# Patient Record
Sex: Male | Born: 1965 | Race: White | Hispanic: No | Marital: Married | Smoking: Current every day smoker
Health system: Southern US, Community
[De-identification: ages and names within clinical notes are randomized; demographics above are authoritative.]

## PROBLEM LIST (undated history)

## (undated) DIAGNOSIS — M1990 Unspecified osteoarthritis, unspecified site: Secondary | ICD-10-CM

## (undated) DIAGNOSIS — I10 Essential (primary) hypertension: Secondary | ICD-10-CM

## (undated) DIAGNOSIS — E785 Hyperlipidemia, unspecified: Secondary | ICD-10-CM

## (undated) DIAGNOSIS — J189 Pneumonia, unspecified organism: Secondary | ICD-10-CM

## (undated) DIAGNOSIS — G4730 Sleep apnea, unspecified: Secondary | ICD-10-CM

## (undated) DIAGNOSIS — R569 Unspecified convulsions: Secondary | ICD-10-CM

## (undated) DIAGNOSIS — F419 Anxiety disorder, unspecified: Secondary | ICD-10-CM

## (undated) DIAGNOSIS — R079 Chest pain, unspecified: Secondary | ICD-10-CM

## (undated) DIAGNOSIS — IMO0001 Reserved for inherently not codable concepts without codable children: Secondary | ICD-10-CM

## (undated) DIAGNOSIS — F319 Bipolar disorder, unspecified: Secondary | ICD-10-CM

## (undated) DIAGNOSIS — Z9289 Personal history of other medical treatment: Secondary | ICD-10-CM

## (undated) DIAGNOSIS — K219 Gastro-esophageal reflux disease without esophagitis: Secondary | ICD-10-CM

## (undated) HISTORY — DX: Chest pain, unspecified: R07.9

## (undated) HISTORY — DX: Unspecified convulsions: R56.9

## (undated) HISTORY — DX: Hyperlipidemia, unspecified: E78.5

## (undated) HISTORY — DX: Gastro-esophageal reflux disease without esophagitis: K21.9

## (undated) HISTORY — PX: BRONCHOSCOPY: SUR163

## (undated) HISTORY — DX: Bipolar disorder, unspecified: F31.9

## (undated) HISTORY — DX: Essential (primary) hypertension: I10

---

## 1988-09-24 HISTORY — PX: SEPTOPLASTY: SUR1290

## 2000-09-24 DIAGNOSIS — J189 Pneumonia, unspecified organism: Secondary | ICD-10-CM

## 2000-09-24 HISTORY — DX: Pneumonia, unspecified organism: J18.9

## 2002-08-28 ENCOUNTER — Emergency Department (HOSPITAL_COMMUNITY): Admission: EM | Admit: 2002-08-28 | Discharge: 2002-08-28 | Payer: Self-pay | Admitting: Emergency Medicine

## 2003-04-22 ENCOUNTER — Encounter: Payer: Self-pay | Admitting: Emergency Medicine

## 2003-04-22 ENCOUNTER — Emergency Department (HOSPITAL_COMMUNITY): Admission: EM | Admit: 2003-04-22 | Discharge: 2003-04-22 | Payer: Self-pay | Admitting: *Deleted

## 2003-06-06 ENCOUNTER — Encounter: Payer: Self-pay | Admitting: Emergency Medicine

## 2003-06-06 ENCOUNTER — Inpatient Hospital Stay (HOSPITAL_COMMUNITY): Admission: EM | Admit: 2003-06-06 | Discharge: 2003-06-21 | Payer: Self-pay | Admitting: Emergency Medicine

## 2003-06-06 ENCOUNTER — Emergency Department (HOSPITAL_COMMUNITY): Admission: EM | Admit: 2003-06-06 | Discharge: 2003-06-06 | Payer: Self-pay | Admitting: Emergency Medicine

## 2003-06-07 ENCOUNTER — Encounter (INDEPENDENT_AMBULATORY_CARE_PROVIDER_SITE_OTHER): Payer: Self-pay | Admitting: Cardiology

## 2003-06-08 ENCOUNTER — Encounter: Payer: Self-pay | Admitting: Infectious Diseases

## 2003-06-11 ENCOUNTER — Encounter: Payer: Self-pay | Admitting: Internal Medicine

## 2003-06-14 ENCOUNTER — Encounter: Payer: Self-pay | Admitting: Infectious Diseases

## 2003-06-16 ENCOUNTER — Encounter: Payer: Self-pay | Admitting: Thoracic Surgery

## 2003-06-17 ENCOUNTER — Encounter: Payer: Self-pay | Admitting: Internal Medicine

## 2003-06-21 ENCOUNTER — Encounter: Payer: Self-pay | Admitting: Internal Medicine

## 2003-07-19 ENCOUNTER — Ambulatory Visit (HOSPITAL_COMMUNITY): Admission: RE | Admit: 2003-07-19 | Discharge: 2003-07-19 | Payer: Self-pay | Admitting: Infectious Diseases

## 2003-07-19 ENCOUNTER — Encounter: Payer: Self-pay | Admitting: Infectious Diseases

## 2003-07-19 ENCOUNTER — Encounter: Admission: RE | Admit: 2003-07-19 | Discharge: 2003-07-19 | Payer: Self-pay | Admitting: Infectious Diseases

## 2003-11-19 ENCOUNTER — Inpatient Hospital Stay (HOSPITAL_COMMUNITY): Admission: EM | Admit: 2003-11-19 | Discharge: 2003-11-22 | Payer: Self-pay | Admitting: Emergency Medicine

## 2006-05-16 ENCOUNTER — Ambulatory Visit: Payer: Self-pay | Admitting: Family Medicine

## 2006-05-17 ENCOUNTER — Ambulatory Visit: Payer: Self-pay | Admitting: *Deleted

## 2006-06-27 ENCOUNTER — Ambulatory Visit: Payer: Self-pay | Admitting: Family Medicine

## 2006-08-29 ENCOUNTER — Ambulatory Visit: Payer: Self-pay | Admitting: Family Medicine

## 2007-02-10 ENCOUNTER — Ambulatory Visit: Payer: Self-pay | Admitting: Family Medicine

## 2007-06-11 ENCOUNTER — Encounter (INDEPENDENT_AMBULATORY_CARE_PROVIDER_SITE_OTHER): Payer: Self-pay | Admitting: *Deleted

## 2007-09-26 ENCOUNTER — Ambulatory Visit: Payer: Self-pay | Admitting: Family Medicine

## 2007-11-11 ENCOUNTER — Ambulatory Visit: Payer: Self-pay | Admitting: Internal Medicine

## 2008-01-05 ENCOUNTER — Ambulatory Visit: Payer: Self-pay | Admitting: Family Medicine

## 2008-01-19 ENCOUNTER — Ambulatory Visit: Payer: Self-pay | Admitting: Internal Medicine

## 2008-02-05 ENCOUNTER — Ambulatory Visit: Payer: Self-pay | Admitting: Internal Medicine

## 2008-02-05 ENCOUNTER — Encounter (INDEPENDENT_AMBULATORY_CARE_PROVIDER_SITE_OTHER): Payer: Self-pay | Admitting: Family Medicine

## 2008-02-05 LAB — CONVERTED CEMR LAB
ALT: 33 units/L (ref 0–53)
AST: 23 units/L (ref 0–37)
Albumin: 5.1 g/dL (ref 3.5–5.2)
BUN: 17 mg/dL (ref 6–23)
Basophils Relative: 0 % (ref 0–1)
CO2: 23 meq/L (ref 19–32)
Chloride: 101 meq/L (ref 96–112)
Cholesterol: 244 mg/dL — ABNORMAL HIGH (ref 0–200)
Creatinine, Ser: 1.12 mg/dL (ref 0.40–1.50)
Eosinophils Absolute: 0.1 10*3/uL (ref 0.0–0.7)
Glucose, Bld: 87 mg/dL (ref 70–99)
HCT: 46.5 % (ref 39.0–52.0)
Hemoglobin: 15.6 g/dL (ref 13.0–17.0)
Lymphocytes Relative: 32 % (ref 12–46)
Monocytes Relative: 6 % (ref 3–12)
Neutro Abs: 3.7 10*3/uL (ref 1.7–7.7)
Potassium: 4.8 meq/L (ref 3.5–5.3)
RBC: 5.21 M/uL (ref 4.22–5.81)
Sodium: 136 meq/L (ref 135–145)
Total Bilirubin: 0.5 mg/dL (ref 0.3–1.2)
Triglycerides: 89 mg/dL (ref <150)
WBC: 6.3 10*3/uL (ref 4.0–10.5)

## 2008-03-08 ENCOUNTER — Ambulatory Visit: Payer: Self-pay | Admitting: Internal Medicine

## 2008-04-05 ENCOUNTER — Ambulatory Visit: Payer: Self-pay | Admitting: Internal Medicine

## 2008-04-20 ENCOUNTER — Encounter (INDEPENDENT_AMBULATORY_CARE_PROVIDER_SITE_OTHER): Payer: Self-pay | Admitting: Family Medicine

## 2008-04-20 ENCOUNTER — Ambulatory Visit: Payer: Self-pay | Admitting: Internal Medicine

## 2008-04-20 LAB — CONVERTED CEMR LAB
ALT: 41 units/L (ref 0–53)
Cholesterol: 199 mg/dL (ref 0–200)
Total CHOL/HDL Ratio: 3.4
Triglycerides: 73 mg/dL (ref <150)
VLDL: 15 mg/dL (ref 0–40)

## 2008-12-14 ENCOUNTER — Ambulatory Visit: Payer: Self-pay | Admitting: Family Medicine

## 2008-12-14 LAB — CONVERTED CEMR LAB
CO2: 24 meq/L (ref 19–32)
Chloride: 100 meq/L (ref 96–112)
Creatinine, Ser: 1.04 mg/dL (ref 0.40–1.50)
PSA: 0.9 ng/mL (ref 0.10–4.00)
Testosterone: 196.29 ng/dL — ABNORMAL LOW (ref 350–890)

## 2009-01-20 ENCOUNTER — Ambulatory Visit: Payer: Self-pay | Admitting: Internal Medicine

## 2009-02-16 ENCOUNTER — Ambulatory Visit: Payer: Self-pay | Admitting: Internal Medicine

## 2009-03-23 ENCOUNTER — Ambulatory Visit: Payer: Self-pay | Admitting: Internal Medicine

## 2009-04-22 ENCOUNTER — Ambulatory Visit: Payer: Self-pay | Admitting: Internal Medicine

## 2009-05-20 ENCOUNTER — Ambulatory Visit: Payer: Self-pay | Admitting: Internal Medicine

## 2009-06-20 ENCOUNTER — Ambulatory Visit: Payer: Self-pay | Admitting: Internal Medicine

## 2009-07-20 ENCOUNTER — Ambulatory Visit: Payer: Self-pay | Admitting: Internal Medicine

## 2009-08-23 ENCOUNTER — Ambulatory Visit: Payer: Self-pay | Admitting: Internal Medicine

## 2009-10-12 ENCOUNTER — Ambulatory Visit: Payer: Self-pay | Admitting: Internal Medicine

## 2009-11-10 ENCOUNTER — Ambulatory Visit: Payer: Self-pay | Admitting: Internal Medicine

## 2009-12-08 ENCOUNTER — Ambulatory Visit: Payer: Self-pay | Admitting: Internal Medicine

## 2010-01-20 ENCOUNTER — Ambulatory Visit: Payer: Self-pay | Admitting: Family Medicine

## 2010-10-25 ENCOUNTER — Other Ambulatory Visit: Payer: Self-pay | Admitting: Family Medicine

## 2010-10-25 ENCOUNTER — Ambulatory Visit (INDEPENDENT_AMBULATORY_CARE_PROVIDER_SITE_OTHER): Payer: BC Managed Care – PPO | Admitting: Family Medicine

## 2010-10-25 ENCOUNTER — Ambulatory Visit: Admit: 2010-10-25 | Payer: Self-pay | Admitting: Family Medicine

## 2010-10-25 ENCOUNTER — Encounter: Payer: Self-pay | Admitting: Family Medicine

## 2010-10-25 DIAGNOSIS — E785 Hyperlipidemia, unspecified: Secondary | ICD-10-CM

## 2010-10-25 DIAGNOSIS — F17200 Nicotine dependence, unspecified, uncomplicated: Secondary | ICD-10-CM | POA: Insufficient documentation

## 2010-10-25 DIAGNOSIS — F319 Bipolar disorder, unspecified: Secondary | ICD-10-CM

## 2010-10-25 DIAGNOSIS — K219 Gastro-esophageal reflux disease without esophagitis: Secondary | ICD-10-CM | POA: Insufficient documentation

## 2010-10-25 DIAGNOSIS — I10 Essential (primary) hypertension: Secondary | ICD-10-CM

## 2010-10-25 LAB — BASIC METABOLIC PANEL
BUN: 17 mg/dL (ref 6–23)
CO2: 27 mEq/L (ref 19–32)
Calcium: 10 mg/dL (ref 8.4–10.5)
Chloride: 102 mEq/L (ref 96–112)
GFR: 90.97 mL/min (ref >60.00)
Glucose, Bld: 79 mg/dL (ref 70–99)
Sodium: 136 mEq/L (ref 135–145)

## 2010-10-25 LAB — LIPID PANEL: VLDL: 18.6 mg/dL (ref 0.0–40.0)

## 2010-11-01 NOTE — Assessment & Plan Note (Signed)
Summary: 45 min appt new pt to est/cle   Vital Signs:  Patient profile:   45 year old male Height:      70.5 inches Weight:      213.50 pounds BMI:     30.31 Temp:     98.3 degrees F oral Pulse rate:   68 / minute Pulse rhythm:   regular BP sitting:   130 / 90  (left arm) Cuff size:   regular  Vitals Entered By: Linde Gillis CMA Duncan Dull) (October 25, 2010 10:50 AM) CC: new patient, establish care  Does patient need assistance? Functional Status Self care Ambulation Normal   History of Present Illness: 45 yo here to establish care.  HTN- has been on Lisinopril HCTZ 10-12.5 mg daily.  No CP, blurred vision, LE edema or SOB.  Has had increased urination since he started it and would like to try something else.  No difficulty starting stream or weak stream.  No dysuria or back pain.  HLD- on Crestor 5 mg daily.  Has not had labs checked in over a year.  Bipolar disorder- diagnosed a  few years ago, has never felt better now that he is being treated for it at the [facility] center, follows up twice a year.  On Seroquel 600 mg daily and Zoloft 100 mg daily.  Tobacco abuse- ready to quit smoking.  Has smoked 1/2 ppd since he was 45 years old and has never quit in past.  He and his wife want to start a family so he is ready to quit.  Has a nicotine patch on right now.  Preventive Screening-Counseling & Management  Alcohol-Tobacco     Smoking Status: current     Smoking Cessation Counseling: yes     Smoke Cessation Stage: ready     Packs/Day: 0.5     Tobacco Counseling: to quit use of tobacco products  Current Medications (verified): 1)  Viagra 100 Mg Tabs (Sildenafil Citrate) .... Take 1/2 To 1 Tablet One Hour Prior To Sexual Activity 2)  Crestor 5 Mg  Tabs (Rosuvastatin Calcium) .... Take 1 Tab By Mouth Daily 3)  Zolpidem Tartrate 10 Mg Tabs (Zolpidem Tartrate) .... Take One Tablet By Mouth At Bedtime 4)  Omeprazole 40 Mg Cpdr (Omeprazole) .Marland Kitchen.. 1 Tab By Mouth Daily. 5)  Seroquel  300 Mg Tabs (Quetiapine Fumarate) .... Take Two Tablets By Mouth At Bedtime 6)  Lisinopril 20 Mg Tabs (Lisinopril) .Marland Kitchen.. 1 Tablet By Mouth Daily 7)  Zoloft 100 Mg Tabs (Sertraline Hcl) .... Take One Tablet By Mouth Every Morning 8)  Bupropion Hcl 150 Mg Xr12h-Tab (Bupropion Hcl) .... Initiate With 150 Mg Once Daily For 3 Days; Increase To 150 Mg Twice Daily; Treatment Should Continue For 7-12 Weeks  Allergies (verified): No Known Drug Allergies  Past History:  Family History: Last updated: 10/25/2010 Family History Depression Family History of Skin cancer- mom died at 20 of Melanoma  Social History: Last updated: 10/25/2010 Married Current Smoker  Risk Factors: Smoking Status: current (10/25/2010) Packs/Day: 0.5 (10/25/2010)  Past Medical History: Hyperlipidemia Hypertension biopolar disorder GERD  Past Surgical History: Denies surgical history  Family History: Family History Depression Family History of Skin cancer- mom died at 53 of Melanoma  Social History: Married Current Smoker Smoking Status:  current Packs/Day:  0.5  Review of Systems      See HPI General:  Denies malaise. Eyes:  Denies blurring. ENT:  Denies difficulty swallowing. CV:  Denies chest pain or discomfort. Resp:  Denies  shortness of breath. GI:  Denies abdominal pain and change in bowel habits. GU:  Complains of erectile dysfunction and urinary frequency; denies discharge, dysuria, incontinence, and urinary hesitancy. MS:  Denies joint pain, joint redness, and joint swelling. Derm:  Denies rash. Neuro:  Denies headaches. Psych:  Denies anxiety, depression, sense of great danger, suicidal thoughts/plans, thoughts of violence, unusual visions or sounds, and thoughts /plans of harming others. Endo:  Denies cold intolerance, heat intolerance, and polyuria. Heme:  Denies abnormal bruising and bleeding.  Physical Exam  General:  alert, well-developed, well-nourished, and well-hydrated.     Head:  normocephalic and atraumatic.   Eyes:  vision grossly intact, pupils equal, pupils round, and pupils reactive to light.   Ears:  R ear normal and L ear normal.   Nose:  no external deformity.   Mouth:  good dentition.   Neck:  No deformities, masses, or tenderness noted. Lungs:  Normal respiratory effort, chest expands symmetrically. Lungs are clear to auscultation, no crackles or wheezes. Heart:  Normal rate and regular rhythm. S1 and S2 normal without gallop, murmur, click, rub or other extra sounds. Abdomen:  Bowel sounds positive,abdomen soft and non-tender without masses, organomegaly or hernias noted. Msk:  No deformity or scoliosis noted of thoracic or lumbar spine.   Extremities:  no edema, small ganglion cyst on left wrist, non tender to palpation Neurologic:  alert & oriented X3 and gait normal.   Skin:  Intact without suspicious lesions or rashes Psych:  Cognition and judgment appear intact. Alert and cooperative with normal attention span and concentration. No apparent delusions, illusions, hallucinations   Impression & Recommendations:  Problem # 1:  HYPERTENSION (ICD-401.9) Assessment Unchanged Stable but he would like to wean off diurectic.  Will d/c HCTZ/Lisinopril combo pill and increase dose of Lisinopril to 20 mg daily. BMET today.  Follow up BP in one month. His updated medication list for this problem includes:    Lisinopril 20 Mg Tabs (Lisinopril) .Marland Kitchen... 1 tablet by mouth daily  Orders: TLB-BMP (Basic Metabolic Panel-BMET) (80048-METABOL)  Problem # 2:  BIPOLAR DISORDER UNSPECIFIED (ICD-296.80) Assessment: Unchanged Stable on current meds.  Problem # 3:  HYPERLIPIDEMIA (ICD-272.4) Assessment: Unchanged recheck FLP today. His updated medication list for this problem includes:    Crestor 5 Mg Tabs (Rosuvastatin calcium) .Marland Kitchen... Take 1 tab by mouth daily  Orders: Venipuncture (62952) TLB-Lipid Panel (80061-LIPID)  Problem # 4:  TOBACCO ABUSE  (ICD-305.1) Assessment: Unchanged Ready to quit! Given h/o bipolar D/o and depression, not appropriate candidate for chantix. discussed other options, will continue patches and add Zyban.  Problem # 5:  GERD (ICD-530.81) Assessment: Unchanged Stable on Protonix but too costly.  Will d/c protonix and try omeprazole. Advised that smoking worsens GERD so hopefully he will not need a PPI after he quits smoking. His updated medication list for this problem includes:    Omeprazole 40 Mg Cpdr (Omeprazole) .Marland Kitchen... 1 tab by mouth daily.  Complete Medication List: 1)  Viagra 100 Mg Tabs (Sildenafil citrate) .... Take 1/2 to 1 tablet one hour prior to sexual activity 2)  Crestor 5 Mg Tabs (Rosuvastatin calcium) .... Take 1 tab by mouth daily 3)  Zolpidem Tartrate 10 Mg Tabs (Zolpidem tartrate) .... Take one tablet by mouth at bedtime 4)  Omeprazole 40 Mg Cpdr (Omeprazole) .Marland Kitchen.. 1 tab by mouth daily. 5)  Seroquel 300 Mg Tabs (Quetiapine fumarate) .... Take two tablets by mouth at bedtime 6)  Lisinopril 20 Mg Tabs (Lisinopril) .Marland Kitchen.. 1 tablet  by mouth daily 7)  Zoloft 100 Mg Tabs (Sertraline hcl) .... Take one tablet by mouth every morning 8)  Bupropion Hcl 150 Mg Xr12h-tab (Bupropion hcl) .... Initiate with 150 mg once daily for 3 days; increase to 150 mg twice daily; treatment should continue for 7-12 weeks  Patient Instructions: 1)  Great to meet you. 2)  Please stop taking the Lisinopril-HCTZ and start taking Lisinopril 20 mg daily. Prescriptions: VIAGRA 100 MG TABS (SILDENAFIL CITRATE) take 1/2 to 1 tablet one hour prior to sexual activity  #10 x 3   Entered and Authorized by:   Ruthe Mannan MD   Signed by:   Ruthe Mannan MD on 10/25/2010   Method used:   Print then Give to Patient   RxID:   [id number] CRESTOR 5 MG  TABS (ROSUVASTATIN CALCIUM) Take 1 tab by mouth daily  #90 x 3   Entered and Authorized by:   Ruthe Mannan MD   Signed by:   Ruthe Mannan MD on 10/25/2010   Method used:   Print then  Give to Patient   RxID:   [id number] OMEPRAZOLE 40 MG CPDR (OMEPRAZOLE) 1 tab by mouth daily.  #90 x 3   Entered and Authorized by:   Ruthe Mannan MD   Signed by:   Ruthe Mannan MD on 10/25/2010   Method used:   Print then Give to Patient   RxID:   [id number] LISINOPRIL 20 MG TABS (LISINOPRIL) 1 tablet by mouth daily  #90 x 3   Entered and Authorized by:   Ruthe Mannan MD   Signed by:   Ruthe Mannan MD on 10/25/2010   Method used:   Print then Give to Patient   RxID:   [id number] BUPROPION HCL 150 MG XR12H-TAB (BUPROPION HCL) Initiate with 150 mg once daily for 3 days; increase to 150 mg twice daily; treatment should continue for 7-12 weeks  #90 x 3   Entered and Authorized by:   Ruthe Mannan MD   Signed by:   Ruthe Mannan MD on 10/25/2010   Method used:   Print then Give to Patient   RxID:   [id number]    Orders Added: 1)  Venipuncture [60109] 2)  TLB-Lipid Panel [80061-LIPID] 3)  TLB-BMP (Basic Metabolic Panel-BMET) [80048-METABOL] 4)  New Patient Level III [32355]    Current Allergies (reviewed today): No known allergies   TD Result Date:  11/11/2007 TD Result:  historical

## 2010-11-02 ENCOUNTER — Telehealth: Payer: Self-pay | Admitting: Family Medicine

## 2010-11-06 ENCOUNTER — Encounter (INDEPENDENT_AMBULATORY_CARE_PROVIDER_SITE_OTHER): Payer: Self-pay | Admitting: *Deleted

## 2010-11-15 NOTE — Miscellaneous (Signed)
Summary: med list update- increase in crestor dose  Medications Added CRESTOR 10 MG TABS (ROSUVASTATIN CALCIUM) take one by mouth daily       Clinical Lists Changes  Medications: Changed medication from CRESTOR 5 MG  TABS (ROSUVASTATIN CALCIUM) Take 1 tab by mouth daily to CRESTOR 10 MG TABS (ROSUVASTATIN CALCIUM) take one by mouth daily     Prior Medications: VIAGRA 100 MG TABS (SILDENAFIL CITRATE) take 1/2 to 1 tablet one hour prior to sexual activity CRESTOR 10 MG TABS (ROSUVASTATIN CALCIUM) take one by mouth daily ZOLPIDEM TARTRATE 10 MG TABS (ZOLPIDEM TARTRATE) take one tablet by mouth at bedtime OMEPRAZOLE 40 MG CPDR (OMEPRAZOLE) 1 tab by mouth daily. SEROQUEL 300 MG TABS (QUETIAPINE FUMARATE) take two tablets by mouth at bedtime LISINOPRIL 20 MG TABS (LISINOPRIL) 1 tablet by mouth daily ZOLOFT 100 MG TABS (SERTRALINE HCL) take one tablet by mouth every morning BUPROPION HCL 150 MG XR12H-TAB (BUPROPION HCL) Initiate with 150 mg once daily for 3 days; increase to 150 mg twice daily; treatment should continue for 7-12 weeks Current Allergies: No known allergies

## 2010-11-21 NOTE — Progress Notes (Signed)
Summary: prior Berkley Harvey is needed for crestor  Phone Note From Pharmacy   Caller: [employer] Summary of Call: Prior Berkley Harvey is needed for crestor, form is on your desk, and can wait until your return to the office. Initial call taken by: Lowella Petties CMA, AAMA,  November 02, 2010 10:10 AM  Follow-up for Phone Call        in my box. Ruthe Mannan MD  November 06, 2010 9:04 AM lMOM for pt to call.              Lowella Petties CMA, AAMA  November 06, 2010 9:21 AM Pt has not tried any other meds for cholesterol, but has been taking this for1-2 years, that dose was recently increased.  Form faxed. Follow-up by: Lowella Petties CMA, AAMA,  November 06, 2010 10:03 AM  Additional Follow-up for Phone Call Additional follow up Details #1::        Prior auth denied for crestor, he has to try generic first.  Denial letter is on your desk.           Lowella Petties CMA, AAMA  November 13, 2010 9:52 AM     Additional Follow-up for Phone Call Additional follow up Details #2::    ok, try simvastatin 40 mg daily please. Ruthe Mannan MD  November 13, 2010 10:32 AM Called in simvastain 40 mg's, one a day to [facility].  LMOM for pt to call.             Lowella Petties CMA, AAMA  November 13, 2010 4:41 PM  Spoke with spouse, Rx faxed to pharmacy.   Follow-up by: Linde Gillis CMA Duncan Dull),  November 13, 2010 4:48 PM  New/Updated Medications: SIMVASTATIN 40 MG TABS (SIMVASTATIN) Take one tablet at bedtime Prescriptions: SIMVASTATIN 40 MG TABS (SIMVASTATIN) Take one tablet at bedtime  #90 x 3   Entered by:   Linde Gillis CMA (AAMA)   Authorized by:   Ruthe Mannan MD   Signed by:   Linde Gillis CMA (AAMA) on 11/13/2010   Method used:   Faxed to ...       [facility] (retail)       [address]       [address]       [place], [place]  [address]  [place]       Ph: [phone]       Fax: [phone]   RxID:   [id number]

## 2011-02-09 NOTE — Discharge Summary (Signed)
[name]                          ACCOUNT NO.:  [id number]   MEDICAL RECORD NO.:  [id number]                   PATIENT TYPE:  INP   LOCATION:  0462                                 FACILITY:  [facility]   PHYSICIAN:  Sherin Quarry, MD                   DATE OF BIRTH:  [birth year]   DATE OF ADMISSION:  11/19/2003  DATE OF DISCHARGE:  11/22/2003                                 DISCHARGE SUMMARY   HISTORY OF PRESENT ILLNESS:  [name] is a 13 year old man who is a  chronic drug abuser.  Prior to this admission, the patient had been smoking  crack cocaine on a regular basis using injectable Dilaudid and OxyContin.  He presented to the [facility] Emergency Room on November 19, 2003, with a  history of fever of about seven days duration associated with chills,  sweats, nausea, and malaise.  He complained of chronic back pain.  He stated  that he had lost weight.  For the last three or four days he had noted  vomiting and diarrhea.  About one week prior to admission he developed a  boil on both his left and right forearm.  He is quite definite that he did  not inject himself in these areas.  Subsequently, he developed boils on his  scrotal area and in his nose.  He presented to the [facility] Emergency  Room with these complaints.  Of note, is that the patient had been  hospitalized in September 2004, with a Staphylococcal sepsis with septic  emboli identified in the chest.  This heightened the level of concern that  he might be experiencing a similar problem.   PHYSICAL EXAMINATION:  VITAL SIGNS:  Temperature was 97, blood pressure  129/89, pulse was 92, respirations 20.  HEENT:  Within normal limits.  CHEST:  Clear to auscultation and percussion.  CARDIOVASCULAR:  Normal S1 and S2.  There were no murmurs, rubs, or gallops.  ABDOMEN:  Benign, normal bowel sounds without masses, tenderness, or  organomegaly.  NEUROLOGIC:  Within normal limits.  EXTREMITIES:  Revealed a 3 cm  raised lesion consistent with a boil on the  forearm of both the right and left arms.  These show minimal residual  fluctuance.  There is also a boil on the scrotum.  The patient had  apparently incised these areas prior to presentation.   LABORATORY DATA:  Urine for an UA and C&S which was culture negative.  CBC  revealed a white count of 7800, there was a normal differential.  Sedimentation rate was 35.  CMET was remarkable for a normal liver profile.  Three sets of blood cultures were obtained.  These are negative at this  time, i.e., after 3-1/2 days of incubation.  A chest x-ray was obtained to  follow up the previous finding of septic emboli in lungs.  This showed  absolutely clear lungs with  no signs of focal consolidation.  The cardiac  silhouette was felt to be normal.   HOSPITAL COURSE:  On admission, the patient was placed empirically on  vancomycin 1 g IV q.12h. and Rocephin 2 g IV q.24h.  He tolerated these  medications well.  He remained afebrile.  The lesions on his forearms dried  up and looked good at this time.  By November 22, 2003, his blood cultures  were negative, and the patient appeared to be doing well.  It was felt  reasonable to discharge him.   DISCHARGE DIAGNOSES:  1. Multiple boils on skin with no evidence of Staphylococcal sepsis.  2. Multiple drug abuse.  3. History of Staphylococcal sepsis.   On discharge, the patient will be instructed to wash his skin on a daily  basis with Hibiclens to prevent Staphylococcal skin carriage.  He indicates  that it is his plan to follow up with the drug rehab treatment facility  through [facility], and that friends are planning to take him there after  he is discharged.                                               Sherin Quarry, MD    SY/MEDQ  D:  11/22/2003  T:  11/22/2003  Job:  [id number]

## 2011-02-09 NOTE — H&P (Signed)
[name], [name]                          ACCOUNT NO.:  [id number]   MEDICAL RECORD NO.:  [id number]                   PATIENT TYPE:  EMS   LOCATION:  MAJO                                 FACILITY:  [facility]   PHYSICIAN:  Jackie Plum, M.D.             DATE OF BIRTH:  1966-02-27   DATE OF ADMISSION:  06/06/2003  DATE OF DISCHARGE:  06/06/2003                                HISTORY & PHYSICAL   PROBLEM LIST:  1. 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     a. Admission CT scan of the chest notable for multiple cavitary nodules.        No significant adenopathy.  2. Mild hemoptysis with likely secondary problem #1.  3. Hyponatremia likely secondary to decreased p.o. intake, cannot rule out     syndrome of inappropriate antidiuretic hormone related to pulmonary     presentation.  4. Leukocytosis secondary to problem #1.  5. Diarrhea etiology unclear, rule out Clostridium difficile colitis in view     of a recent treatment with antibiotics.  6. History of depression.  7. History of IV drug use.  8. History of illicit drug use.  9. Normocytic anemia secondary to problem #1.   CHIEF COMPLAINT:  Hemoptysis.   HISTORY OF PRESENT ILLNESS:  The patient is a 45 year old Caucasian  gentleman with history of IV drug use who presents with a three week history  of fever and chills.  He had been discharged from the hospital in July after  treatment for pneumonia.  The patient had been doing fairly well until about  two to three weeks ago when he started experiencing intermittent fever and  chills.  Over the last one week his problems have progressively worsened,  cough was chunks of very black hemoptysis (volume of which is quantified  as about 20% of teaspoon).  He has been feeling nauseous with generalized  weakness.  He has also been having some vomiting with diarrhea which is not  bloody and not mucoid.  He  denies any abdominal pain.  He has also been  experiencing some sternal chest pain which seems to be pleuritic, worsened  by deep breaths and coughing.  He has been having some occasional headaches.  He denies any frequency of micturition or dysuria.  He has lost about 10  pounds of weight over the last two months or so.  His urine has noted to be  dark in coloration.   PAST MEDICAL HISTORY:  Unremarkable for any chronic illnesses.  As mentioned  above he was there for pneumonia in [facility] in July of 2004.   ALLERGIES:  No known drug allergies.   MEDICATIONS:  Takes Paxil and Ambien.   FAMILY HISTORY:  Negative for heart disease.   SOCIAL HISTORY:  The patient is unemployed.  Drinks alcohol occasionally.  Smokes cigarettes one  pack a day for about 20 years.  He uses crack cocaine.   REVIEW OF SYSTEMS:  Significant positive and negative as in HPI but  otherwise unremarkable.   PHYSICAL EXAMINATION:  VITAL SIGNS:  Temperature 98.2 degrees Farenheit.  BP  125/81, heart rate 60, respiratory rate 18.  O2 saturation 99% on 2 liters  of oxygen.  GENERAL:  He was not in acute cardiopulmonary distress.  He was in painful  distress.  He looked chronically ill.  He had mild bitemporal recession.  HEENT:  Normocephalic, atraumatic, mild conjunctival pallor without any  icterus.  Oropharynx was slightly dry.  He did not have any adenopathy.  Extraocular movements were intact.  Pupils were equal, round to light.  His  tympanic membranes were unremarkable.  Oropharynx exam was negative for any  acute exudation or erythema.  NECK:  Supple with no thyromegaly.  No lymphadenopathy was appreciated.  LUNGS:  Described breath sounds and no adventitious sounds were appreciated  in the negative pressure isolation room at the ED.  CARDIAC:  He had regular rate and rhythm.  I could not appreciate any murmur  in the negative pressure room at the ED.  SKIN:  Warm and dry.  No rash was seen.   EXTREMITIES:  No cyanosis.  No clubbing.  No Janeway lesions appreciated.  No tenderness of the tips of the fingers was also appreciated.  CENTRAL NERVOUS SYSTEM:  He is alert and oriented x3 with an acute  cardiopulmonary distress.   LABORATORY DATA:  CT scan of chest as noted above.  UA was notable for large  blood.  No significant WBCs or bacteria.  His WBC count was 20.7, hemoglobin  11.5, hematocrit 34, MCV 83.6, platelet count 448.  Sodium 127, potassium  4.2, chloride 94, CO2 23, glucose 90, BUN 15, creatinine 1.1, bilirubin 0.8,  alkaline phosphatase __________  .  __________  20, PT 13, protein 8.6,  albumin 3.4, calcium 8.9.  Drug screen positive for cocaine.   IMPRESSION:  My impression is that of endocarditis in a patient with  intravenous drug use complicated by septic emboli to the lungs.  I cannot  rule out atypical pneumonia.   DISPOSITION:  The patient is chronic and therefore I doubt that he has  Staphylococcal pneumonia.  The patient is ill looking.   PLAN OF CARE:  Admit the patient to a telemetry bed.  He will get one set of  cardiac enzymes.  We will also look out for the patient's CPK on account of  complaints of dark urine.  We will know whether he may have rhabdomyolysis  associated with his infectious illness.  He will be __________  .  He will  receive supportive care including antiemetics and IV fluids and oxygen.   The patient will need counseling for his illicit drug use.                                               Jackie Plum, M.D.    GO/MEDQ  D:  06/06/2003  T:  06/06/2003  Job:  [id number]

## 2011-02-09 NOTE — Op Note (Signed)
   [name], [name]                          ACCOUNT NO.:  [id number]   MEDICAL RECORD NO.:  [id number]                   PATIENT TYPE:  INP   LOCATION:  0340                                 FACILITY:  [facility]   PHYSICIAN:  Meade Maw, M.D.                 DATE OF BIRTH:  02/26/1966   DATE OF PROCEDURE:  DATE OF DISCHARGE:                                 OPERATIVE REPORT   PROCEDURE:  Transesophageal echocardiogram.   REFERRING PHYSICIAN:  Jackie Plum, M.D.   INDICATION FOR TRANSESOPHAGEAL ECHOCARDIOGRAM:  Evaluation for bacterial  endocarditis.   DESCRIPTION OF PROCEDURE:  After obtaining written informed consent, the  patient was brought to the endoscopy lab in a postabsorptive state.  Topical  anesthesia was achieved using Cetacaine spray and viscous lidocaine.  The  patient was then given a total of 12 mg of Versed and 200 mcg of IV  fentanyl.  We were still unable to obtain appropriate sedation.  Anesthesia  support was obtained.  He was given 50 mg of Pentothal.  We were then able  to introduce the OmniPlane probe.  Multiple views were obtained at the  midesophageal, basal, and deep gastric views.  Color-flow Doppler was  performed across the mitral, tricuspid, and aortic valve.  Bubble study was  performed.  The ascending and descending aorta was then visualized.   FINDINGS:  He had normal mitral valve morphology.  There was mild mitral  regurgitation noted.  He had normal tricuspid valve morphology.  There was  mild to moderate tricuspid regurgitation noted.  He had normal aortic valve  morphology.  There was no aortic insufficiency noted.  He had normal chamber  dimensions, normal systolic function, estimated ejection fraction of 60%.  The left atrial appendage was within normal limits.  There was a negative  bubble study.  The ascending and descending aorta was within normal limits.  The pulmonary artery was grossly normal.   IMPRESSION:  No evidence of  bacterial endocarditis.                                               Meade Maw, M.D.    HP/MEDQ  D:  06/09/2003  T:  06/10/2003  Job:  [id number]

## 2011-02-09 NOTE — Discharge Summary (Signed)
NAMEKAMERON, [name]                          ACCOUNT NO.:  [id number]   MEDICAL RECORD NO.:  [id number]                   PATIENT TYPE:  INP   LOCATION:  0340                                 FACILITY:  [facility]   PHYSICIAN:  Sherin Quarry, MD                   DATE OF BIRTH:  01-Aug-1966   DATE OF ADMISSION:  06/06/2003  DATE OF DISCHARGE:                                 DISCHARGE SUMMARY   HISTORY OF PRESENT ILLNESS:  [name] is a 45 year old man with a  long-standing history of intravenous drug use and crack cocaine usage who  presented to [facility] on September 12 with a three week history  of fever and chills.  The patient reported that in addition, he had been  experiencing increased cough which had been productive of bloody phlegm.  He  experienced associated nausea, vomiting, and about 10 pounds weight loss.  His past history was remarkable only for an episode of pneumonia in July of  this year.  He smokes about one pack of cigarettes per day.   PHYSICAL EXAMINATION:  (At the time of admission as described by Jackie Plum, M.D.)  VITAL SIGNS:  Temperature 98.2, blood pressure 125/81, heart rate 60,  respirations 18, O2 saturation 99% on 2 L of oxygen.  GENERAL:  The patient was not felt to be in acute distress, although he  looked chronically ill and somewhat emaciated.  HEENT:  Within normal limits.  NECK:  Supple.  CHEST:  Diminished breath sounds at the bases.  CARDIOVASCULAR:  Normal S1 and S2 without murmurs, rubs, or gallops.  ABDOMEN:  Benign.  Normal bowel sounds without masses, tenderness,  organomegaly.  NEUROLOGIC:  Within normal limits.  EXTREMITIES:  Felt to be within normal limits as well.   LABORATORIES:  Serial blood cultures:  These were positive for  Staphylococcus aureus in multiple bottles.  The organism was methicillin  sensitive.  Drug screen was positive for cocaine.  Serial cardiac enzymes  were negative.  HIV serology was  nonreactive.  Initially, the sodium was  127, potassium 4.2, chloride 94, creatinine 1.1.  White count 20,700,  hemoglobin 11.5, platelets 448,000.  Initial chest x-ray showed abnormal  findings in the mediastinum which were initially interpreted as likely  secondary to adenopathy.  A CT scan of the chest was done and this showed  multiple cavitary lesions within the upper and lower lobes bilaterally which  looked like a septic embolic process.  On admission the patient was  administered IV fluids in the form of normal saline.  He was placed on  vancomycin, the dose of which was adjusted per pharmaceutical protocol.  He  was also started on Rocephin 1 g IV daily.  Ultimately the vancomycin dosage  was 1.25 g IV q.12h.  Subsequently, the patient was seen in consultation by  Rockey Situ. Roxan Hockey, M.D.  of the infectious disease service.  He recommended  that a transesophageal echocardiogram be performed and suggested that two  weeks of therapy might be adequate.  The transthoracic echocardiogram showed  evidence of tricuspid regurgitation.  The transesophageal echocardiogram was  negative for signs of endocarditis.  Therefore, ultimately, Rockey Situ.  Roxan Hockey, M.D. recommended that the patient be treated with nafcillin 2 g IV  q.4h. and gentamicin the dosage of which was ultimately 60 mg IV q.12h.  Subsequently, serial chest x-rays suggested that the patient had developed a  pleural effusion.  In light of this finding and in light of the presence of  abscesses in the chest, consultation was obtained with Ines Bloomer,  M.D. who recommended that an ultrasound guided thoracentesis be performed.  This was done on September 22.  645 mL of fluid was removed.  Studies on  this fluid showed the pH to be 7.38, the glucose to be 55.  AFB and fungal  cultures were negative.  Routine cultures were negative.  His total protein  was 4.6.  The most recent CT scan obtained was on September 27.  This showed   the hilar lesion to be essentially unchanged.  It showed some regression in  the size of other lesions.  On September 27 as the patient has completed two  weeks of therapy, it was felt reasonable to discharge him on a regimen of  oral Keflex.   IMPRESSION AND PLAN:  As per Rockey Situ. Roxan Hockey, M.D. recommendation we will  send him home on 500 mg of Keflex p.o. t.i.d.  He will also continue Paxil  20 mg daily, Protonix 40 mg daily, Zyprexa 15 mg at bedtime daily.  He  indicates that he will plan to enroll in an outpatient drug treatment  program, specifically [facility] and he has actually been placed on the waiting  list for this program.  He plans to live with his father after discharge.  Rockey Situ. Roxan Hockey, M.D. has graciously agreed to see the patient in follow-  up in two weeks and this will be arranged.  Efforts will be made to see if  the hospital can provide the patient with his antibiotic medications.   CONDITION ON DISCHARGE:  Fair.   DISCHARGE DIAGNOSES:  1. Staphylococcal sepsis with TEE negative for endocarditis.  2. Multiple pulmonary abscesses secondary to #1.  3. History of depression.  4. History of intravenous drug use.                                               Sherin Quarry, MD    SY/MEDQ  D:  06/21/2003  T:  06/21/2003  Job:  [id number]   cc:   Rockey Situ. Flavia Shipper., M.D.  [address]  [place]  [place] [address]  Fax: [phone]   Ines Bloomer, M.D.  [address]  [place]  [place] [address]

## 2011-02-13 ENCOUNTER — Encounter: Payer: Self-pay | Admitting: Family Medicine

## 2011-02-14 ENCOUNTER — Encounter: Payer: Self-pay | Admitting: Family Medicine

## 2011-02-14 ENCOUNTER — Other Ambulatory Visit: Payer: Self-pay | Admitting: *Deleted

## 2011-02-14 ENCOUNTER — Ambulatory Visit (INDEPENDENT_AMBULATORY_CARE_PROVIDER_SITE_OTHER): Payer: BC Managed Care – PPO | Admitting: Family Medicine

## 2011-02-14 VITALS — BP 104/80 | HR 95 | Temp 98.6°F | Ht 70.0 in | Wt 211.0 lb

## 2011-02-14 DIAGNOSIS — F319 Bipolar disorder, unspecified: Secondary | ICD-10-CM

## 2011-02-14 DIAGNOSIS — R3911 Hesitancy of micturition: Secondary | ICD-10-CM

## 2011-02-14 DIAGNOSIS — I10 Essential (primary) hypertension: Secondary | ICD-10-CM

## 2011-02-14 DIAGNOSIS — E785 Hyperlipidemia, unspecified: Secondary | ICD-10-CM

## 2011-02-14 DIAGNOSIS — N419 Inflammatory disease of prostate, unspecified: Secondary | ICD-10-CM

## 2011-02-14 LAB — POCT URINALYSIS DIPSTICK
Bilirubin, UA: NEGATIVE
Blood, UA: NEGATIVE
Glucose, UA: NEGATIVE
Leukocytes, UA: NEGATIVE
Nitrite, UA: NEGATIVE
Urobilinogen, UA: NEGATIVE
pH, UA: 5

## 2011-02-14 MED ORDER — ALPRAZOLAM 0.25 MG PO TABS: 0.2500 mg | ORAL_TABLET | Freq: Three times a day (TID) | ORAL | Status: DC | PRN

## 2011-02-14 MED ORDER — SIMVASTATIN 40 MG PO TABS: 40.0000 mg | ORAL_TABLET | Freq: Every day | ORAL | Status: DC

## 2011-02-14 MED ORDER — OMEPRAZOLE 40 MG PO CPDR: 40.0000 mg | DELAYED_RELEASE_CAPSULE | Freq: Every day | ORAL | Status: DC

## 2011-02-14 MED ORDER — CIPROFLOXACIN HCL 500 MG PO TABS: 500.0000 mg | ORAL_TABLET | Freq: Two times a day (BID) | ORAL | Status: AC

## 2011-02-14 MED ORDER — ZOLPIDEM TARTRATE 10 MG PO TABS: 10.0000 mg | ORAL_TABLET | Freq: Every day | ORAL | Status: DC

## 2011-02-14 MED ORDER — LISINOPRIL 20 MG PO TABS: 20.0000 mg | ORAL_TABLET | Freq: Every day | ORAL | Status: DC

## 2011-02-14 NOTE — Patient Instructions (Signed)
Prostatitis Prostatitis is an inflammation (the body's way of reacting to injury and/or infection) of the prostate gland. The prostate gland is a male organ. The gland is about the size and shape of a walnut. The prostate is located just below the bladder. It produces semen, which is a fluid that helps nourish and transport sperm. Prostatitis is the most common urinary tract problem in men younger than age 43. There are 4 categories of prostatitis:  I - Acute bacterial prostatitis.   II - Chronic bacterial prostatitis.   III - Chronic prostatitis and chronic pelvic pain syndrome (CPPS).   Inflammatory.   Non inflammatory.   IV - Asymptomatic inflammatory prostatitis.  Acute and chronic bacterial prostatitis are problems with bacterial infections of the prostate. "Acute" infection is usually a one-time problem. "Chronic" bacterial prostatitis is a condition with recurrent infection. It is usually caused by the same germ (bacteria). CPPS has symptoms similar to prostate infection. However, no infection is actually found. This condition can cause problems of ongoing pain. Currently, it cannot be cured. Treatments are available and aimed at symptom control.  Asymptomatic inflammatory prostatitis has no symptoms. It is a condition where infection-fighting cells are found by chance in the urine. The diagnosis is made most often during an exam for other conditions. Other conditions could be infertility or a high level of PSA (prostate-specific antigen) in the blood. SYMPTOMS Symptoms can vary depending upon the type of prostatitis that exists. There can also be overlap in symptoms. This can make diagnosis difficult. Symptoms: For Acute bacterial prostatitis  Painful urination.  Fever and/or chills.   Muscle and/or joint pains.   Low back pain.   Low abdominal pain.   Inability to empty bladder completely.   Sudden urges to urinate.  Frequent urination during the day.   Difficulty  starting urine stream.   Need to urinate several times at night (nocturia).   Weak urine stream.   Urethral (tube that carries urine from the bladder out of the body) discharge and dribbling after urination.   For Chronic bacterial prostatitis  Rectal pain.   Pain in the testicles, penis, or tip of the penis.   Pain in the space between the anus and scrotum (perineum).   Low back pain.   Low abdominal pain.   Problems with sexual function.   Painful ejaculation.   Bloody semen.   Inability to empty bladder completely.   Painful urination.   Sudden urges to urinate.   Frequent urination during the day.   Difficulty starting urine stream.   Need to urinate several times at night (nocturia).   Weak urine stream.   Dribbling after urination.   Urethral discharge.   For Chronic prostatitis and chronic pelvic pain syndrome (CPPS) Symptoms are the same as those for chronic bacterial prostatitis. Problems with sexual function are often the reason for seeking care. This important problem should be discussed with your caregiver. For Asymptomatic inflammatory prostatitis As noted above, there are no symptoms with this condition. DIAGNOSIS  Your caregiver may perform a rectal exam. This exam is to determine if the prostate is swollen and tender.   Sometimes blood work is performed. This is done to see if your white blood cell count is elevated. The Prostate Specific Antigen (PSA) is also measured. PSA is a blood test that can help detect early prostate cancer.   A urinalysis is done to find out what type of infection is present if this is a suspected cause. An additional  urinalysis may be done after a digital rectal exam. This is to see if white blood cells are pushed out of the prostate and into the urine. A low-grade infection of the prostate may not be found on the first urinalysis.  In more difficult cases, your caregiver may advise other tests. Tests could  include:  Urodynamics -- Tests the function of the bladder and the organs involved in triggering and controlling normal urination.   Urine flow rate.   Cystoscopy -- In this procedure, a thin, telescope-like tube with a light and tiny camera attached (cystoscope) is inserted into the bladder through the urethra. This allows the caregiver to see the inside of the urethra and bladder.   Electromyography -- This procedure tests how the muscles and nerves of the bladder work. It is focused on the muscles that control the anus and pelvic floor. These are the muscles between the anus and scrotum.  In people who show no signs of infection, certain uncommon infections might be causing constant or recurrent symptoms. These uncommon infections are difficult to detect. More work in medicine may help find solutions to these problems. TREATMENT Antibiotics are used to treat infections caused by germs. If the infection is not treated and becomes long lasting (chronic), it may become a lower grade infection with minor, continual problems. Without treatment, the prostate may develop a boil or furuncle (abscess). This may require surgical treatment. For those with chronic prostatitis and CPPS, it is important to work closely with your primary caregiver and urologist. For some, the medicines that are used to treat a non-cancerous, enlarged prostate (benign prostatic hypertrophy) may be helpful. Referrals to specialists other than urologists may be necessary. In rare cases when all treatments have been inadequate for pain control, an operation to remove the prostate may be recommended. This is very rare and before this is considered thorough discussion with your urologist is highly recommended.  In cases of secondary to chronic non-bacterial prostatitis, a good relationship with your urologist or primary caregiver is essential because it is often a recurrent prolonged condition that requires a good understanding of the  causes and a commitment to therapy aimed at controlling your symptoms. HOME CARE INSTRUCTIONS  Hot sitz baths for 20 minutes, 4 times per day, may help relieve pain.   Non-prescription pain killers may be used as your caregiver recommends if you have no allergies to them. Some illnesses or conditions prevent use of non-prescription drugs. If unsure, check with your caregiver. Take all medications as directed. Take the antibiotics for the prescribed length of time, even if you are feeling better.  SEEK MEDICAL CARE IF:  You have any worsening of the symptoms that originally brought you to your caregiver.   You have an oral temperature above 100.4.   You experience any side effects from medications prescribed.  SEEK IMMEDIATE MEDICAL CARE IF:  You have an oral temperature above 100.4, not controlled by medicine.   You have pain not relieved with medications.   You develop nausea, vomiting, lightheadedness, or have a fainting episode.   You are unable to urinate.   You pass bloody urine or clots.  Document Released: 09/07/2000 Document Re-Released: 12/05/2009 [facility] Patient Information 2011 [place], [place].

## 2011-02-14 NOTE — Assessment & Plan Note (Signed)
Stable with worsening panic attacks. Rx given for as needed Xanax.

## 2011-02-14 NOTE — Assessment & Plan Note (Signed)
New. UA neg. Will placed on 4 week course of Cipro. PSA ordered as well.

## 2011-02-14 NOTE — Telephone Encounter (Signed)
Patient request a 90 day supply of his medications.  He is coming in today to see Dr. Dayton Martes and would like to get the Rx's then.

## 2011-02-14 NOTE — Telephone Encounter (Signed)
Will give patient Rx's when he comes in today.

## 2011-02-14 NOTE — Progress Notes (Signed)
45 yo here for:  1.  Urinary frequency/hesitation- ongoing issue for several months. Weak stream, difficulty emptying bladder. No rectal pain, dysuria, fevers or chills. No FH of prostate CA.     Bipolar disorder- diagnosed a  few years ago, has never felt better now that he is being treated for it at the [facility] center, follows up twice a year.  On Seroquel 600 mg daily and Zoloft 100 mg daily.  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 No SI or HI. Used to have rx for prn xanax.  The PMH, PSH, Social History, Family History, Medications, and allergies have been reviewed in [facility], and have been updated if relevant.   Review of Systems       See HPI General:  Denies malaise. Eyes:  Denies blurring. ENT:  Denies difficulty swallowing. CV:  Denies chest pain or discomfort. Resp:  Denies shortness of breath. GI:  Denies abdominal pain and change in bowel habits. GU:  Complains of erectile dysfunction and urinary frequency; denies discharge, dysuria, incontinence, and urinary hesitancy. MS:  Denies joint pain, joint redness, and joint swelling. Derm:  Denies rash. Neuro:  Denies headaches. Psych:  Denies anxiety, depression, sense of great danger, suicidal thoughts/plans, thoughts of violence, unusual visions or sounds, and thoughts /plans of harming others. Endo:  Denies cold intolerance, heat intolerance, and polyuria. Heme:  Denies abnormal bruising and bleeding.  Physical Exam BP 104/80  Pulse 95  Temp(Src) 98.6 F (37 C) (Oral)  Ht 5\' 10"  (1.778 m)  Wt 211 lb (95.709 kg)  BMI 30.28 kg/m2  General:  alert, well-developed, well-nourished, and well-hydrated.   Head:  normocephalic and atraumatic.   Eyes:  vision grossly intact, pupils equal, pupils round, and pupils reactive to light.   Ears:  R ear normal and L ear normal.   Nose:  no external deformity.   Mouth:  good dentition.   Neck:  No deformities, masses, or tenderness  noted. Lungs:  Normal respiratory effort, chest expands symmetrically. Lungs are clear to auscultation, no crackles or wheezes. Heart:  Normal rate and regular rhythm. S1 and S2 normal without gallop, murmur, click, rub or other extra sounds. Abdomen:  Bowel sounds positive,abdomen soft and non-tender without masses, organomegaly or hernias noted. Msk:  No deformity or scoliosis noted of thoracic or lumbar spine.   Extremities:  no edema, small ganglion cyst on left wrist, non tender to palpation Neurologic:  alert & oriented X3 and gait normal.   Skin:  Intact without suspicious lesions or rashes Prostate:  Mildly enlarged, tender to palp, no masses Psych:  Cognition and judgment appear intact. Alert and cooperative with normal attention span and concentration. No apparent delusions, illusions, hallucinations

## 2011-02-26 ENCOUNTER — Other Ambulatory Visit: Payer: Self-pay | Admitting: *Deleted

## 2011-02-26 MED ORDER — ZOLPIDEM TARTRATE 10 MG PO TABS: 10.0000 mg | ORAL_TABLET | Freq: Every day | ORAL | Status: DC

## 2011-02-26 MED ORDER — LISINOPRIL 20 MG PO TABS: 20.0000 mg | ORAL_TABLET | Freq: Every day | ORAL | Status: DC

## 2011-02-26 MED ORDER — SIMVASTATIN 40 MG PO TABS: 40.0000 mg | ORAL_TABLET | Freq: Every day | ORAL | Status: DC

## 2011-02-26 NOTE — Telephone Encounter (Signed)
Patients spouse requested 30 day Rx refill from local pharmacy until mail order Rx's comes in.

## 2011-02-28 ENCOUNTER — Telehealth: Payer: Self-pay | Admitting: *Deleted

## 2011-02-28 NOTE — Telephone Encounter (Signed)
Prior auth is needed for zolpidem, form is on your desk. 

## 2011-03-01 NOTE — Telephone Encounter (Signed)
On my desk

## 2011-03-01 NOTE — Telephone Encounter (Signed)
Form faxed to [employer].

## 2011-03-03 NOTE — Telephone Encounter (Signed)
Prior auth given for zolpidem, approval letter placed on doctor's desk for signature and scanning.

## 2011-03-14 ENCOUNTER — Other Ambulatory Visit: Payer: Self-pay | Admitting: *Deleted

## 2011-03-15 MED ORDER — ALPRAZOLAM 0.25 MG PO TABS: 0.2500 mg | ORAL_TABLET | Freq: Three times a day (TID) | ORAL | Status: DC | PRN

## 2011-03-16 NOTE — Telephone Encounter (Signed)
Rx called to pharmacy

## 2011-04-13 ENCOUNTER — Other Ambulatory Visit: Payer: Self-pay | Admitting: *Deleted

## 2011-04-13 MED ORDER — ALPRAZOLAM 0.25 MG PO TABS: 0.2500 mg | ORAL_TABLET | Freq: Three times a day (TID) | ORAL | Status: DC | PRN

## 2011-04-13 NOTE — Telephone Encounter (Signed)
Last filled 03/16/11. 

## 2011-04-16 NOTE — Telephone Encounter (Signed)
Medication called to pharmacy- [place].

## 2011-05-16 ENCOUNTER — Other Ambulatory Visit: Payer: Self-pay | Admitting: *Deleted

## 2011-05-17 MED ORDER — ALPRAZOLAM 0.25 MG PO TABS: 0.2500 mg | ORAL_TABLET | Freq: Three times a day (TID) | ORAL | Status: DC | PRN

## 2011-05-17 NOTE — Telephone Encounter (Signed)
Rx called to [facility]. 

## 2011-06-18 ENCOUNTER — Other Ambulatory Visit: Payer: Self-pay | Admitting: *Deleted

## 2011-06-18 MED ORDER — ALPRAZOLAM 0.25 MG PO TABS: 0.2500 mg | ORAL_TABLET | Freq: Three times a day (TID) | ORAL | Status: DC | PRN

## 2011-06-18 NOTE — Telephone Encounter (Signed)
Last refill 05/17/2011.

## 2011-06-19 NOTE — Telephone Encounter (Signed)
Rx called to [facility]. 

## 2011-07-17 ENCOUNTER — Other Ambulatory Visit: Payer: Self-pay | Admitting: *Deleted

## 2011-07-17 MED ORDER — ALPRAZOLAM 0.25 MG PO TABS: 0.2500 mg | ORAL_TABLET | Freq: Three times a day (TID) | ORAL | Status: DC | PRN

## 2011-07-17 NOTE — Telephone Encounter (Signed)
Rx called to [facility]. 

## 2011-07-17 NOTE — Telephone Encounter (Signed)
Last refill 06/19/2011.

## 2011-08-15 ENCOUNTER — Other Ambulatory Visit: Payer: Self-pay | Admitting: *Deleted

## 2011-08-15 MED ORDER — ALPRAZOLAM 0.25 MG PO TABS: 0.2500 mg | ORAL_TABLET | Freq: Three times a day (TID) | ORAL | Status: DC | PRN

## 2011-08-15 NOTE — Telephone Encounter (Signed)
Last refill 07/17/2011.

## 2011-08-15 NOTE — Telephone Encounter (Signed)
Rx called to [facility].

## 2011-08-22 ENCOUNTER — Other Ambulatory Visit: Payer: Self-pay | Admitting: *Deleted

## 2011-08-22 MED ORDER — ZOLPIDEM TARTRATE 10 MG PO TABS: 10.0000 mg | ORAL_TABLET | Freq: Every day | ORAL | Status: DC

## 2011-08-22 NOTE — Telephone Encounter (Signed)
Needs order for generic ambien 90 day rx

## 2011-08-23 NOTE — Telephone Encounter (Signed)
Rx called to [facility]. 

## 2011-08-24 ENCOUNTER — Telehealth: Payer: Self-pay | Admitting: *Deleted

## 2011-08-24 NOTE — Telephone Encounter (Signed)
Pharmacy called ([facility]) called have not gotten rx on pt yet.   I checked chard Rx was called in to the wrong pharmacy (called to his local pharmacy) I called local pharmacy and had them cancel rx. I will call it into correct pharmacy.

## 2011-09-14 ENCOUNTER — Other Ambulatory Visit: Payer: Self-pay | Admitting: *Deleted

## 2011-09-14 MED ORDER — ALPRAZOLAM 0.25 MG PO TABS: 0.2500 mg | ORAL_TABLET | Freq: Three times a day (TID) | ORAL | Status: DC | PRN

## 2011-09-14 NOTE — Telephone Encounter (Signed)
rx called to pharmacy 

## 2011-10-18 ENCOUNTER — Other Ambulatory Visit: Payer: Self-pay | Admitting: *Deleted

## 2011-10-18 MED ORDER — ALPRAZOLAM 0.25 MG PO TABS: 0.2500 mg | ORAL_TABLET | Freq: Three times a day (TID) | ORAL | Status: DC | PRN

## 2011-10-18 NOTE — Telephone Encounter (Signed)
Rx called to [facility]. 

## 2011-10-18 NOTE — Telephone Encounter (Signed)
OK to refill? Please send back to Nik[facility].  

## 2011-11-16 ENCOUNTER — Other Ambulatory Visit: Payer: Self-pay

## 2011-11-16 MED ORDER — ALPRAZOLAM 0.25 MG PO TABS: 0.2500 mg | ORAL_TABLET | Freq: Three times a day (TID) | ORAL | Status: DC | PRN

## 2011-11-16 NOTE — Telephone Encounter (Signed)
Received fax refill request from patients pharmacy.  Okay to refill? 

## 2011-11-19 ENCOUNTER — Telehealth: Payer: Self-pay | Admitting: *Deleted

## 2011-11-19 ENCOUNTER — Ambulatory Visit (INDEPENDENT_AMBULATORY_CARE_PROVIDER_SITE_OTHER): Payer: BC Managed Care – PPO | Admitting: Family Medicine

## 2011-11-19 ENCOUNTER — Encounter: Payer: Self-pay | Admitting: Family Medicine

## 2011-11-19 VITALS — BP 108/80 | Temp 98.0°F | Wt 218.0 lb

## 2011-11-19 DIAGNOSIS — R3911 Hesitancy of micturition: Secondary | ICD-10-CM | POA: Insufficient documentation

## 2011-11-19 DIAGNOSIS — E349 Endocrine disorder, unspecified: Secondary | ICD-10-CM | POA: Insufficient documentation

## 2011-11-19 DIAGNOSIS — E291 Testicular hypofunction: Secondary | ICD-10-CM

## 2011-11-19 LAB — CBC WITH DIFFERENTIAL/PLATELET
Basophils Absolute: 0 10*3/uL (ref 0.0–0.1)
Eosinophils Absolute: 0.1 10*3/uL (ref 0.0–0.7)
Eosinophils Relative: 1.5 % (ref 0.0–5.0)
MCHC: 33.7 g/dL (ref 30.0–36.0)
MCV: 90.3 fl (ref 78.0–100.0)
Monocytes Absolute: 0.4 10*3/uL (ref 0.1–1.0)
Neutrophils Relative %: 64.6 % (ref 43.0–77.0)
Platelets: 234 10*3/uL (ref 150.0–400.0)
RDW: 13.4 % (ref 11.5–14.6)
WBC: 7.2 10*3/uL (ref 4.5–10.5)

## 2011-11-19 MED ORDER — TESTOSTERONE 50 MG/5GM (1%) TD GEL: 10.0000 g | Freq: Every day | TRANSDERMAL | Status: DC

## 2011-11-19 NOTE — Telephone Encounter (Signed)
Please call in rx as entered below. Thank you.

## 2011-11-19 NOTE — Telephone Encounter (Signed)
Rx called in as directed.   

## 2011-11-19 NOTE — Telephone Encounter (Signed)
Advised pt.  He would like for you to prescribe the androgel because it's going to be awhile before he sees urologist.  Uses [facility].

## 2011-11-19 NOTE — Telephone Encounter (Signed)
Advised pt of lab results.  He is asking if he would be a candidate for testosterone injections.  Please advise.

## 2011-11-19 NOTE — Telephone Encounter (Signed)
Medicine called to [facility]. 

## 2011-11-19 NOTE — Telephone Encounter (Signed)
I do not prescribe testosterone injections because there are too many peaks and valleys- difficult to get a steady level of testosterone but urologist would probably give them if he was interested.

## 2011-11-19 NOTE — Progress Notes (Signed)
46 yo here for:  1.  Urinary frequency/hesitation- ongoing issue for several months. Had not improved with cipro for presumed prostatitis. Weak stream, difficulty emptying bladder. No rectal pain, dysuria, fevers or chills. No FH of prostate CA.   Lab Results  Component Value Date   PSA 0.87 02/14/2011   PSA 0.90 12/14/2008      2.  Low T- Had been on testosterone for years but stopped taking it when he was trying to conceive their 35 month old daughter. Has been fatigued, more depressed. Would like to restart supplementation.  Patient Active Problem List  Diagnoses  . HYPERLIPIDEMIA  . BIPOLAR DISORDER UNSPECIFIED  . TOBACCO ABUSE  . HYPERTENSION  . GERD  . Prostatitis  . Urinary hesitancy  . Low testosterone   Past Medical History  Diagnosis Date  . Hyperlipidemia   . Hypertension   . GERD (gastroesophageal reflux disease)   . Bipolar disorder    No past surgical history on file. History  Substance Use Topics  . Smoking status: Current Everyday Smoker  . Smokeless tobacco: Not on file  . Alcohol Use:    Family History  Problem Relation Age of Onset  . Cancer Mother     melanoma  . Depression Other   . Cancer Other     skin   No Known Allergies Current Outpatient Prescriptions on File Prior to Visit  Medication Sig Dispense Refill  . ALPRAZolam (XANAX) 0.25 MG tablet Take 1 tablet (0.25 mg total) by mouth 3 (three) times daily as needed for anxiety.  90 tablet  0  . buPROPion (WELLBUTRIN SR) 150 MG 12 hr tablet Initiate with 150mg  once daily for 3 days, increase to 150mg  twice daily; treatment should continue for 7-12 weeks       . lisinopril (PRINIVIL,ZESTRIL) 20 MG tablet Take 1 tablet (20 mg total) by mouth daily.  30 tablet  0  . omeprazole (PRILOSEC) 40 MG capsule Take 1 capsule (40 mg total) by mouth daily.  90 capsule  3  . QUEtiapine (SEROQUEL) 300 MG tablet Take two tablets by mouth at bedtime       . sertraline (ZOLOFT) 100 MG tablet Take 100 mg by  mouth daily.        . sildenafil (VIAGRA) 100 MG tablet Take 1/2 to 1 tablet one hour prior to sexual activity       . simvastatin (ZOCOR) 40 MG tablet Take 1 tablet (40 mg total) by mouth at bedtime.  30 tablet  0  . zolpidem (AMBIEN) 10 MG tablet Take 1 tablet (10 mg total) by mouth at bedtime.  90 tablet  0     The PMH, PSH, Social History, Family History, Medications, and allergies have been reviewed in [facility], and have been updated if relevant.   Review of Systems       See HPI General:  Denies malaise. Eyes:  Denies blurring. ENT:  Denies difficulty swallowing. CV:  Denies chest pain or discomfort. Resp:  Denies shortness of breath. GI:  Denies abdominal pain and change in bowel habits. GU:  Complains of erectile dysfunction and urinary frequency; denies discharge, dysuria, incontinence, and urinary hesitancy. MS:  Denies joint pain, joint redness, and joint swelling. Derm:  Denies rash. Neuro:  Denies headaches. Psych:  Denies anxiety, depression, sense of great danger, suicidal thoughts/plans, thoughts of violence, unusual visions or sounds, and thoughts /plans of harming others. Endo:  Denies cold intolerance, heat intolerance, and polyuria. Heme:  Denies abnormal bruising  and bleeding.  Physical Exam BP 108/80  Temp(Src) 98 F (36.7 C) (Oral)  Wt 218 lb (98.884 kg)  General:  alert, well-developed, well-nourished, and well-hydrated.   Head:  normocephalic and atraumatic.   Eyes:  vision grossly intact, pupils equal, pupils round, and pupils reactive to light.   Ears:  R ear normal and L ear normal.   Nose:  no external deformity.   Mouth:  good dentition.   Neck:  No deformities, masses, or tenderness noted. Lungs:  Normal respiratory effort, chest expands symmetrically. Lungs are clear to auscultation, no crackles or wheezes. Heart:  Normal rate and regular rhythm. S1 and S2 normal without gallop, murmur, click, rub or other extra sounds. Psych:  Cognition and  judgment appear intact. Alert and cooperative with normal attention span and concentration. No apparent delusions, illusions, hallucinations  Assessment and Plan:  1. Urinary hesitancy  ?chronic prostatitis, will refer to urology for further work up. Ambulatory referral to Urology  2. Low testosterone  Recheck Testosterone, CBC today.  Testosterone, CBC with Differential

## 2011-11-19 NOTE — Patient Instructions (Signed)
Good to see you. Please stop by to see Shirlee Limerick on your way out. We will call you with your labs results.

## 2011-11-21 ENCOUNTER — Telehealth: Payer: Self-pay | Admitting: *Deleted

## 2011-11-21 NOTE — Telephone Encounter (Signed)
That is ok to change

## 2011-11-21 NOTE — Telephone Encounter (Signed)
Received a fax from pharmacy stating that patient would like to change the Androgel Rx to 1.62% because the 1% has a $200 copay.   Please advise.

## 2011-11-21 NOTE — Telephone Encounter (Signed)
Per Dr. Dayton Martes, advised pharmacist to change script to 40.5 mgs, or 2 pumps daily.

## 2011-12-13 ENCOUNTER — Other Ambulatory Visit: Payer: Self-pay | Admitting: *Deleted

## 2011-12-13 MED ORDER — ALPRAZOLAM 0.25 MG PO TABS: 0.2500 mg | ORAL_TABLET | Freq: Three times a day (TID) | ORAL | Status: DC | PRN

## 2011-12-13 MED ORDER — ZOLPIDEM TARTRATE 10 MG PO TABS: 10.0000 mg | ORAL_TABLET | Freq: Every day | ORAL | Status: DC

## 2011-12-13 NOTE — Telephone Encounter (Signed)
Ok to refill? Please send back to Laurie. 

## 2011-12-13 NOTE — Telephone Encounter (Signed)
Medicine called to pharmacy. 

## 2011-12-13 NOTE — Telephone Encounter (Signed)
Faxed request from [facility].  Last filled 08/24/11.

## 2011-12-14 NOTE — Telephone Encounter (Signed)
Medicine called to [facility] per pt's request.

## 2011-12-17 ENCOUNTER — Telehealth: Payer: Self-pay | Admitting: *Deleted

## 2011-12-17 NOTE — Telephone Encounter (Signed)
Prior auth is needed for ambien, form is on your desk. 

## 2011-12-18 NOTE — Telephone Encounter (Signed)
Form faxed, pt has not tried any other sleep aids.

## 2011-12-18 NOTE — Telephone Encounter (Signed)
On my desk

## 2012-01-14 ENCOUNTER — Other Ambulatory Visit: Payer: Self-pay | Admitting: *Deleted

## 2012-01-14 MED ORDER — ALPRAZOLAM 0.25 MG PO TABS: 0.2500 mg | ORAL_TABLET | Freq: Three times a day (TID) | ORAL | Status: DC | PRN

## 2012-01-14 NOTE — Telephone Encounter (Signed)
Faxed refill request from [facility], last filled 12/13/11.

## 2012-01-14 NOTE — Telephone Encounter (Signed)
Medicine called to pharmacy. 

## 2012-01-28 NOTE — Telephone Encounter (Signed)
Per pt's wife, his prior Berkley Harvey was approved.

## 2012-02-29 ENCOUNTER — Other Ambulatory Visit: Payer: Self-pay | Admitting: *Deleted

## 2012-02-29 ENCOUNTER — Other Ambulatory Visit: Payer: Self-pay | Admitting: Family Medicine

## 2012-02-29 MED ORDER — ZOLPIDEM TARTRATE 10 MG PO TABS: 10.0000 mg | ORAL_TABLET | Freq: Every day | ORAL | Status: DC

## 2012-02-29 NOTE — Telephone Encounter (Signed)
Addended by: Eliezer Bottom on: 02/29/2012 04:49 PM   Modules accepted: Orders

## 2012-03-03 NOTE — Telephone Encounter (Signed)
Script faxed to pharmacy

## 2012-03-14 ENCOUNTER — Other Ambulatory Visit: Payer: Self-pay | Admitting: *Deleted

## 2012-03-14 MED ORDER — ALPRAZOLAM 0.25 MG PO TABS: 0.2500 mg | ORAL_TABLET | Freq: Three times a day (TID) | ORAL | Status: DC | PRN

## 2012-03-14 NOTE — Telephone Encounter (Signed)
Faxed refill request from [facility], last filled 02/12/12.

## 2012-03-14 NOTE — Telephone Encounter (Signed)
Medicine called to pharmacy. 

## 2012-04-11 ENCOUNTER — Other Ambulatory Visit: Payer: Self-pay | Admitting: *Deleted

## 2012-04-11 MED ORDER — ALPRAZOLAM 0.25 MG PO TABS: 0.2500 mg | ORAL_TABLET | Freq: Three times a day (TID) | ORAL | Status: DC | PRN

## 2012-04-11 NOTE — Telephone Encounter (Signed)
Faxed refill request from [facility], last filled 90 on 03/14/12.

## 2012-04-14 NOTE — Telephone Encounter (Signed)
Medicine called to pharmacy. 

## 2012-05-14 ENCOUNTER — Other Ambulatory Visit: Payer: Self-pay | Admitting: *Deleted

## 2012-05-14 NOTE — Telephone Encounter (Signed)
Faxed refill request from [facility], last filled 04/14/12.

## 2012-05-15 MED ORDER — ALPRAZOLAM 0.25 MG PO TABS: 0.2500 mg | ORAL_TABLET | Freq: Three times a day (TID) | ORAL | Status: DC | PRN

## 2012-05-15 NOTE — Telephone Encounter (Signed)
Medicine called to pharmacy. 

## 2012-06-02 ENCOUNTER — Other Ambulatory Visit: Payer: Self-pay

## 2012-06-02 MED ORDER — ZOLPIDEM TARTRATE 10 MG PO TABS: 10.0000 mg | ORAL_TABLET | Freq: Every day | ORAL | Status: DC

## 2012-06-02 MED ORDER — TESTOSTERONE 50 MG/5GM (1%) TD GEL: 10.0000 g | Freq: Every day | TRANSDERMAL | Status: DC

## 2012-06-02 NOTE — Telephone Encounter (Signed)
pts wife request refill Ambien and Androgel to [facility].Please advise.

## 2012-06-02 NOTE — Telephone Encounter (Signed)
meds called to [facility], androgel called in as 2 boxes with 3 refills.

## 2012-06-03 MED ORDER — TESTOSTERONE 20.25 MG/1.25GM (1.62%) TD GEL: 2.0000 "application " | Freq: Every day | TRANSDERMAL | Status: DC

## 2012-06-03 NOTE — Telephone Encounter (Signed)
Ok to refill as requested 

## 2012-06-03 NOTE — Addendum Note (Signed)
Addended by: Dianne Dun on: 06/03/2012 10:21 AM   Modules accepted: Orders

## 2012-06-03 NOTE — Telephone Encounter (Signed)
Note from pharmacy, pt wants androgel 1.62% gel pump.  Apply 2 pumps daily.

## 2012-06-03 NOTE — Telephone Encounter (Signed)
Medicine called to pharmacy, quantity changed to 1 box with 3 refills, as that is what was called in yesterday.

## 2012-06-03 NOTE — Telephone Encounter (Signed)
Noted.  Please call in as entered below. Thank you.

## 2012-06-03 NOTE — Telephone Encounter (Signed)
Dr. Dayton Martes, I'm not sure which one to call in.  There are different choices of strenghts on the med list.

## 2012-06-16 ENCOUNTER — Telehealth: Payer: Self-pay | Admitting: Family Medicine

## 2012-06-16 ENCOUNTER — Other Ambulatory Visit: Payer: Self-pay | Admitting: *Deleted

## 2012-06-16 MED ORDER — ALPRAZOLAM 0.25 MG PO TABS: 0.2500 mg | ORAL_TABLET | Freq: Three times a day (TID) | ORAL | Status: DC | PRN

## 2012-06-16 NOTE — Telephone Encounter (Signed)
[facility] Triage Call Report Triage Record Num: [id number] Operator: Albertine Grates Patient Name: [name] Call Date & Time: 06/13/2012 6:05:14PM Patient Phone: [phone] PCP: Ruthe Mannan Patient Gender: Male PCP Fax : [phone] Patient DOB: 12-07-1965 [facility] Name[facility] Reason for Call: Caller: [place]/Patient; PCP: Ruthe Mannan (Family Practice); CB#: [phone]; Returned call to patient and wife states did not call office but pharmacy may have called as contacted them concerning refill for Xanax. Advised call office 9-23. Protocol(s) Used: Office Note Recommended Outcome per Protocol: Information Noted and Sent to Office Reason for Outcome: Caller information to office Care Advice: ~ 09/

## 2012-06-16 NOTE — Telephone Encounter (Signed)
Rx called in as directed.   

## 2012-06-16 NOTE — Telephone Encounter (Signed)
Dr Aron is out of office today 

## 2012-06-16 NOTE — Telephone Encounter (Signed)
plz phone in. 

## 2012-06-18 ENCOUNTER — Telehealth: Payer: Self-pay

## 2012-06-18 NOTE — Telephone Encounter (Signed)
pts wife returning call; pts wife said she was on phone in v/m and someone called but did not get name of caller/ Dr Elmer Sow CMA has not tried to call pt. Cannot locate caller. Pts wife said they got pts refill and does not need anything else.

## 2012-07-15 ENCOUNTER — Other Ambulatory Visit: Payer: Self-pay | Admitting: *Deleted

## 2012-07-15 MED ORDER — ALPRAZOLAM 0.25 MG PO TABS: 0.2500 mg | ORAL_TABLET | Freq: Three times a day (TID) | ORAL | Status: DC | PRN

## 2012-07-15 NOTE — Telephone Encounter (Signed)
Medicine called to [facility]. 

## 2012-08-12 ENCOUNTER — Other Ambulatory Visit: Payer: Self-pay | Admitting: *Deleted

## 2012-08-12 MED ORDER — ALPRAZOLAM 0.25 MG PO TABS: 0.2500 mg | ORAL_TABLET | Freq: Three times a day (TID) | ORAL | Status: DC | PRN

## 2012-08-12 NOTE — Telephone Encounter (Signed)
Faxed refill request from [facility], last filled 07/15/12.

## 2012-08-12 NOTE — Telephone Encounter (Signed)
Refill called to [facility].

## 2012-08-25 ENCOUNTER — Telehealth: Payer: Self-pay | Admitting: *Deleted

## 2012-08-25 ENCOUNTER — Other Ambulatory Visit: Payer: Self-pay | Admitting: *Deleted

## 2012-08-25 MED ORDER — ZOLPIDEM TARTRATE 10 MG PO TABS: 10.0000 mg | ORAL_TABLET | Freq: Every day | ORAL | Status: DC

## 2012-08-25 NOTE — Telephone Encounter (Signed)
Medicine called to [facility]. 

## 2012-08-25 NOTE — Telephone Encounter (Signed)
Phone call from pt's wife, she has faxed a form that she needs completed so that pt can apply for patient assistance for androgel.  Form completed, signed by Dr. Dayton Martes.  Copy of form made, original mailed back to patient.

## 2012-08-25 NOTE — Telephone Encounter (Signed)
Faxed refill request from [facility], last filled 90 on 06/02/12.

## 2012-09-08 ENCOUNTER — Telehealth: Payer: Self-pay

## 2012-09-08 NOTE — Telephone Encounter (Signed)
Ok to change as requested

## 2012-09-08 NOTE — Telephone Encounter (Signed)
Lady left v/m requesting med changed from Prilosec to generic Protonix to [facility]. Prilosec upsets pt stomach. Pt has tried Protonix before and worked well for indigestion.Please advise.

## 2012-09-09 ENCOUNTER — Other Ambulatory Visit: Payer: Self-pay | Admitting: *Deleted

## 2012-09-09 MED ORDER — ALPRAZOLAM 0.25 MG PO TABS: 0.2500 mg | ORAL_TABLET | Freq: Three times a day (TID) | ORAL | Status: DC | PRN

## 2012-09-09 MED ORDER — PANTOPRAZOLE SODIUM 40 MG PO TBEC: 40.0000 mg | DELAYED_RELEASE_TABLET | Freq: Every day | ORAL | Status: DC

## 2012-09-09 NOTE — Telephone Encounter (Signed)
Patient notified that script has been sent to the pharmacy. ?

## 2012-09-09 NOTE — Telephone Encounter (Signed)
Please confirm dose and directions.

## 2012-09-09 NOTE — Telephone Encounter (Signed)
Rx sent 

## 2012-09-11 NOTE — Telephone Encounter (Signed)
rx called in

## 2012-10-09 ENCOUNTER — Other Ambulatory Visit: Payer: Self-pay | Admitting: *Deleted

## 2012-10-09 MED ORDER — ALPRAZOLAM 0.25 MG PO TABS: 0.2500 mg | ORAL_TABLET | Freq: Three times a day (TID) | ORAL | Status: DC | PRN

## 2012-10-09 NOTE — Telephone Encounter (Signed)
Medicine called to pharmacy. 

## 2012-10-31 ENCOUNTER — Telehealth: Payer: Self-pay | Admitting: *Deleted

## 2012-10-31 NOTE — Telephone Encounter (Signed)
Prior auth needed for androgel, form is on your desk.

## 2012-10-31 NOTE — Telephone Encounter (Signed)
Form faxed

## 2012-10-31 NOTE — Telephone Encounter (Signed)
Form signed and on my desk. 

## 2012-11-05 NOTE — Telephone Encounter (Signed)
Prior auth given for androgel, advised pharmacy.  Approval letter placed on doctor's desk for signature and scanning.

## 2012-11-07 ENCOUNTER — Telehealth: Payer: Self-pay | Admitting: Family Medicine

## 2012-11-07 MED ORDER — ALPRAZOLAM 0.25 MG PO TABS: 0.2500 mg | ORAL_TABLET | Freq: Three times a day (TID) | ORAL | Status: DC | PRN

## 2012-11-07 NOTE — Telephone Encounter (Signed)
I called in 30 pills to give pt time to make f/u appointment with PCP.  I will defer further arrangements to PCP.

## 2012-11-07 NOTE — Telephone Encounter (Signed)
Caller advises that he has been trying to contact office since 11/04/2012 for refill on Alprazolam 0.25mg  TID but unable to do so d/t inclement weather and office being closed; emergent sxs r/o per Medication Questions - Adult; called on called provider and Dr. Para March asked for information to be sent to him via EPIC for verification.  Alprazolam 0.25mg  TID [facility] [address] [place], [place] [address] [phone] Fax [phone]  Donna Bernard, RN Prepared by Alden Server RN Burnett Sheng has no training in Epic documentation.)

## 2012-11-10 ENCOUNTER — Telehealth: Payer: Self-pay | Admitting: Family Medicine

## 2012-11-10 NOTE — Telephone Encounter (Signed)
[facility] Triage Call Report Triage Record Num: [id number] Operator: Donna Bernard Patient Name: [name] Call Date & Time: 11/07/2012 8:33:01PM Patient Phone: [phone] PCP: Ruthe Mannan Patient Gender: Male PCP Fax : [phone] Patient DOB: Sep 22, 1966 Practice Name[facility] Reason for Call: Caller: [place]/Patient; PCP: Ruthe Mannan (Family Practice); CB#: [phone]; Call regarding Medication Issue; Medication(s): Alprazolam .25mg  three a day; caller advises that he has been trying to contact office since 11/04/2012 for refill on Alprazolam 0.25mg  TID but unable to do so d/t inclement weather and office being closed; emergent sxs r/o per Medication Questions - Adult; called on called provider and Dr. Para March asked for information to be sent to him via EPIC for verification. Protocol(s) Used: Medication Questions - Adult Recommended Outcome per Protocol: Provided Health Information Override Outcome if Used in Protocol: See Provider Immediately RN Reason for Override Outcome: Nursing Judgement Used. Reason for Outcome: Caller has medication question(s) that was answered with available resources Care Advice: ~ 11/07/2012 9:12:30PM Page 1 of 1 [id number]

## 2012-11-11 ENCOUNTER — Other Ambulatory Visit: Payer: Self-pay | Admitting: Family Medicine

## 2012-11-11 MED ORDER — ALPRAZOLAM 0.25 MG PO TABS: 0.2500 mg | ORAL_TABLET | Freq: Three times a day (TID) | ORAL | Status: DC | PRN

## 2012-11-11 NOTE — Telephone Encounter (Signed)
Refill request faxed from [facility].  Last filled 10/09/12.

## 2012-11-11 NOTE — Telephone Encounter (Signed)
Phoned in to [facility].

## 2012-11-12 ENCOUNTER — Other Ambulatory Visit: Payer: Self-pay | Admitting: *Deleted

## 2012-11-12 NOTE — Telephone Encounter (Signed)
Received fax request from pharmacy, # 30 tabs of xanax was called on 11/07/12, pt wants a 30 day supply #90

## 2012-11-12 NOTE — Telephone Encounter (Signed)
Needs to come through the PCP.

## 2012-11-13 NOTE — Telephone Encounter (Signed)
Noted.  Ok to refill as requested.

## 2012-11-13 NOTE — Telephone Encounter (Signed)
#  90 called to pharmacy on 2/18 by Carlena Sax.  Pt was only given a 10 day supply on 2/14 and requested a 30 day supply.

## 2012-11-25 ENCOUNTER — Other Ambulatory Visit: Payer: Self-pay | Admitting: *Deleted

## 2012-11-25 MED ORDER — ZOLPIDEM TARTRATE 10 MG PO TABS: 10.0000 mg | ORAL_TABLET | Freq: Every day | ORAL | Status: DC

## 2012-11-25 NOTE — Telephone Encounter (Signed)
Last filled 08/26/12

## 2012-11-26 ENCOUNTER — Other Ambulatory Visit: Payer: Self-pay | Admitting: Family Medicine

## 2012-11-26 NOTE — Telephone Encounter (Signed)
Phoned in to [facility]. 

## 2012-12-02 ENCOUNTER — Other Ambulatory Visit: Payer: Self-pay | Admitting: Family Medicine

## 2012-12-03 ENCOUNTER — Other Ambulatory Visit: Payer: Self-pay | Admitting: *Deleted

## 2012-12-03 MED ORDER — TESTOSTERONE 20.25 MG/1.25GM (1.62%) TD GEL: 2.0000 "application " | Freq: Every day | TRANSDERMAL | Status: DC

## 2012-12-03 NOTE — Telephone Encounter (Signed)
Medicine called to [facility].

## 2012-12-03 NOTE — Telephone Encounter (Signed)
Pt has not had testosterone level or a f/u with you since 11/19/11.

## 2012-12-11 ENCOUNTER — Other Ambulatory Visit: Payer: Self-pay | Admitting: *Deleted

## 2012-12-11 MED ORDER — ALPRAZOLAM 0.25 MG PO TABS: 0.2500 mg | ORAL_TABLET | Freq: Three times a day (TID) | ORAL | Status: DC | PRN

## 2012-12-11 NOTE — Telephone Encounter (Signed)
Last filled  11/17/12

## 2012-12-12 NOTE — Telephone Encounter (Signed)
Medicine called to [facility]. 

## 2013-01-01 ENCOUNTER — Other Ambulatory Visit: Payer: Self-pay | Admitting: Family Medicine

## 2013-01-02 ENCOUNTER — Other Ambulatory Visit: Payer: Self-pay | Admitting: *Deleted

## 2013-01-02 MED ORDER — TESTOSTERONE 20.25 MG/1.25GM (1.62%) TD GEL: 2.0000 "application " | Freq: Every day | TRANSDERMAL | Status: DC

## 2013-01-02 NOTE — Telephone Encounter (Signed)
Last filled 12/03/12

## 2013-01-02 NOTE — Telephone Encounter (Signed)
Advised patient's wife patient must have office visit before further refills.  Appointment scheduled for next week.

## 2013-01-02 NOTE — Telephone Encounter (Signed)
Medicine called to pharmacy. 

## 2013-01-07 ENCOUNTER — Encounter: Payer: Self-pay | Admitting: Family Medicine

## 2013-01-07 ENCOUNTER — Ambulatory Visit (INDEPENDENT_AMBULATORY_CARE_PROVIDER_SITE_OTHER): Payer: BC Managed Care – PPO | Admitting: Family Medicine

## 2013-01-07 VITALS — BP 120/90 | HR 108 | Temp 98.0°F | Wt 216.0 lb

## 2013-01-07 DIAGNOSIS — I10 Essential (primary) hypertension: Secondary | ICD-10-CM

## 2013-01-07 DIAGNOSIS — E349 Endocrine disorder, unspecified: Secondary | ICD-10-CM

## 2013-01-07 DIAGNOSIS — E785 Hyperlipidemia, unspecified: Secondary | ICD-10-CM

## 2013-01-07 DIAGNOSIS — F319 Bipolar disorder, unspecified: Secondary | ICD-10-CM

## 2013-01-07 DIAGNOSIS — E291 Testicular hypofunction: Secondary | ICD-10-CM

## 2013-01-07 LAB — PSA: PSA: 1.19 ng/mL (ref 0.10–4.00)

## 2013-01-07 LAB — COMPREHENSIVE METABOLIC PANEL
ALT: 36 U/L (ref 0–53)
AST: 29 U/L (ref 0–37)
Albumin: 4.8 g/dL (ref 3.5–5.2)
Alkaline Phosphatase: 84 U/L (ref 39–117)
Glucose, Bld: 78 mg/dL (ref 70–99)
Potassium: 4.4 mEq/L (ref 3.5–5.1)
Sodium: 137 mEq/L (ref 135–145)
Total Bilirubin: 0.6 mg/dL (ref 0.3–1.2)
Total Protein: 7.7 g/dL (ref 6.0–8.3)

## 2013-01-07 LAB — CBC WITH DIFFERENTIAL/PLATELET
Eosinophils Absolute: 0 10*3/uL (ref 0.0–0.7)
HCT: 48.7 % (ref 39.0–52.0)
Lymphs Abs: 1.7 10*3/uL (ref 0.7–4.0)
MCHC: 33.3 g/dL (ref 30.0–36.0)
MCV: 90.8 fl (ref 78.0–100.0)
Monocytes Absolute: 0.4 10*3/uL (ref 0.1–1.0)
Neutrophils Relative %: 71.4 % (ref 43.0–77.0)
Platelets: 237 10*3/uL (ref 150.0–400.0)

## 2013-01-07 LAB — HEMOGLOBIN A1C: Hgb A1c MFr Bld: 5.6 % (ref 4.6–6.5)

## 2013-01-07 LAB — LIPID PANEL
Cholesterol: 184 mg/dL (ref 0–200)
LDL Cholesterol: 129 mg/dL — ABNORMAL HIGH (ref 0–99)
VLDL: 16.4 mg/dL (ref 0.0–40.0)

## 2013-01-07 NOTE — Patient Instructions (Addendum)
Good to see you. We will call you with your lab results.  Please stop by to see [name] on your way out to set up your psychiatry referral.

## 2013-01-07 NOTE — Progress Notes (Signed)
47 yo with h/o HTN, HLD, low testosterone, here for follow up.  HTN- on Lisinopril 20 mg daily. Lab Results  Component Value Date   CREATININE 1.0 10/25/2010   Denies any CP, SOB or LE edema.  No blurred vision.  HLD- takes zocor 40 mg daily.  Due for labs.  Denies myalgias. Lab Results  Component Value Date   CHOL 226* 10/25/2010   HDL 53.70 10/25/2010   LDLCALC 126* 04/20/2008   LDLDIRECT 170.0 10/25/2010   TRIG 93.0 10/25/2010   CHOLHDL 4 10/25/2010    Low T- Had been on on and off testosterone for years.  Currently taking androgel and feels less fatigued.  Has not needed viagra in over year. Lab Results  Component Value Date   TESTOSTERONE 298.04* 11/19/2011   Lab Results  Component Value Date   PSA 0.87 02/14/2011   PSA 0.90 12/14/2008   Lab Results  Component Value Date   WBC 7.2 11/19/2011   HGB 15.3 11/19/2011   HCT 45.4 11/19/2011   MCV 90.3 11/19/2011   PLT 234.0 11/19/2011    Bipolar disorder- on Seroquel.  Followed by Leonides Grills.  He would like to go to a private psychiatrist at this point now that they have insurance.  Symptoms have been well controlled on current meds.        The PMH, PSH, Social History, Family History, Medications, and allergies have been reviewed in [facility], and have been updated if relevant.   Review of Systems       See HPI No anxiety or depression Denies mania Physical Exam BP 120/90  Pulse 108  Temp(Src) 98 F (36.7 C)  Wt 216 lb (97.977 kg)  BMI 30.99 kg/m2  General:  alert, well-developed, well-nourished, and well-hydrated.   Head:  normocephalic and atraumatic.   Eyes:  vision grossly intact, pupils equal, pupils round, and pupils reactive to light.   Ears:  R ear normal and L ear normal.   Nose:  no external deformity.   Mouth:  good dentition.   Neck:  No deformities, masses, or tenderness noted. Lungs:  Normal respiratory effort, chest expands symmetrically. Lungs are clear to auscultation, no crackles or wheezes. Heart:   Normal rate and regular rhythm. S1 and S2 normal without gallop, murmur, click, rub or other extra sounds. Psych:  Cognition and judgment appear intact. Alert and cooperative with normal attention span and concentration. No apparent delusions, illusions, hallucinations  Assessment and Plan: 1. BIPOLAR DISORDER UNSPECIFIED Stable on current meds. Check labs today, refer to another psychiatrist.  2. HYPERLIPIDEMIA On Zocor 40 mg daily. Due for labs. - Lipid Panel  3. HYPERTENSION Well controlled on current dose of Prinivil. - Comprehensive metabolic panel  4. Testosterone deficiency Symptoms improved.  Recheck labs today. - CBC with Differential - Testosterone, free PSA

## 2013-01-08 ENCOUNTER — Encounter: Payer: Self-pay | Admitting: *Deleted

## 2013-01-08 LAB — TESTOSTERONE, FREE, TOTAL, SHBG
Testosterone-% Free: 1.4 % — ABNORMAL LOW (ref 1.6–2.9)
Testosterone: 830.36 ng/dL (ref 300–890)

## 2013-01-12 ENCOUNTER — Other Ambulatory Visit: Payer: Self-pay | Admitting: *Deleted

## 2013-01-12 ENCOUNTER — Encounter: Payer: Self-pay | Admitting: *Deleted

## 2013-01-12 MED ORDER — ALPRAZOLAM 0.25 MG PO TABS: 0.2500 mg | ORAL_TABLET | Freq: Three times a day (TID) | ORAL | Status: DC | PRN

## 2013-01-12 NOTE — Telephone Encounter (Signed)
Faxed refill request from [facility], last filled 12/12/12.

## 2013-01-12 NOTE — Telephone Encounter (Signed)
Medicine called to [facility].

## 2013-02-03 ENCOUNTER — Other Ambulatory Visit: Payer: Self-pay | Admitting: Family Medicine

## 2013-02-03 ENCOUNTER — Other Ambulatory Visit: Payer: Self-pay | Admitting: *Deleted

## 2013-02-03 MED ORDER — ZOLPIDEM TARTRATE 10 MG PO TABS: 10.0000 mg | ORAL_TABLET | Freq: Every day | ORAL | Status: DC

## 2013-02-03 NOTE — Telephone Encounter (Signed)
Medication phoned to pharmacy.  

## 2013-02-09 ENCOUNTER — Other Ambulatory Visit: Payer: Self-pay | Admitting: *Deleted

## 2013-02-09 MED ORDER — ALPRAZOLAM 0.25 MG PO TABS: 0.2500 mg | ORAL_TABLET | Freq: Three times a day (TID) | ORAL | Status: DC | PRN

## 2013-02-09 NOTE — Telephone Encounter (Signed)
Faxed refill request from [facility], last filled 01/12/13.

## 2013-02-09 NOTE — Telephone Encounter (Signed)
Medicine called to pharmacy. 

## 2013-02-19 ENCOUNTER — Telehealth: Payer: Self-pay

## 2013-02-19 NOTE — Telephone Encounter (Signed)
[name] with Dr Shelda Altes office left v/m that pt cancelled initial appt with Dr Maryruth Bun on 02/20/13; pt stated did not need appt any longer.

## 2013-03-09 ENCOUNTER — Other Ambulatory Visit: Payer: Self-pay | Admitting: Family Medicine

## 2013-03-09 NOTE — Telephone Encounter (Signed)
Meds called to [facility].

## 2013-05-06 ENCOUNTER — Other Ambulatory Visit: Payer: Self-pay | Admitting: *Deleted

## 2013-05-07 MED ORDER — TESTOSTERONE 20.25 MG/1.25GM (1.62%) TD GEL: 2.0000 "application " | Freq: Every day | TRANSDERMAL | Status: DC

## 2013-05-07 NOTE — Telephone Encounter (Signed)
Refill called to [facility].

## 2013-06-08 ENCOUNTER — Other Ambulatory Visit: Payer: Self-pay | Admitting: *Deleted

## 2013-06-08 MED ORDER — TESTOSTERONE 20.25 MG/ACT (1.62%) TD GEL: 2.0000 "application " | Freq: Every day | TRANSDERMAL | Status: DC

## 2013-06-08 NOTE — Telephone Encounter (Signed)
Received faxed refill request from pharmacy. Last office visit  01/07/13. Is it okay to refill medication?

## 2013-06-08 NOTE — Telephone Encounter (Signed)
Ok to refill one time only.  Needs labs for further refills. 

## 2013-06-08 NOTE — Telephone Encounter (Signed)
Refill on androgel called to pharmacy, with instructions that pt needs lab work before further refills.

## 2013-07-10 ENCOUNTER — Other Ambulatory Visit: Payer: Self-pay | Admitting: Family Medicine

## 2013-08-26 ENCOUNTER — Other Ambulatory Visit: Payer: Self-pay

## 2013-08-26 MED ORDER — PANTOPRAZOLE SODIUM 40 MG PO TBEC: DELAYED_RELEASE_TABLET | ORAL | Status: DC

## 2013-08-26 NOTE — Telephone Encounter (Signed)
I generally do not px protonix more than twice daily due to its labeling  Please increase to bid and follow up with Dr Dayton Martes when she returns - if symptoms worse we may have to go with another route- please follow up earlier  I will send px to [facility]

## 2013-08-26 NOTE — Telephone Encounter (Signed)
pts wife said pt request refill protonix with change in directions to take med three times a day. Pt has heartburn all the time; when pt takes protonix 3 times a day does not have heartburn; when pt transferred from Dr Dow Adolph pt was taking Protonix three times a day. [facility]. Pts wife request cb.

## 2013-08-26 NOTE — Telephone Encounter (Signed)
Pt's wife notified of Dr. Royden Purl recommendations and pt will schedule an appt with Dr. Dayton Martes when she returns

## 2013-08-31 ENCOUNTER — Other Ambulatory Visit: Payer: Self-pay | Admitting: Family Medicine

## 2013-09-25 ENCOUNTER — Other Ambulatory Visit: Payer: Self-pay | Admitting: Family Medicine

## 2013-09-25 NOTE — Telephone Encounter (Signed)
Spoke to pt and informed him that an office visit is required for additional medication refills

## 2013-09-30 ENCOUNTER — Other Ambulatory Visit: Payer: Self-pay | Admitting: *Deleted

## 2013-09-30 NOTE — Telephone Encounter (Signed)
Pt requesting medication refill. Last ov 4/16 with no additional refills. pls advise

## 2013-10-01 MED ORDER — ALPRAZOLAM 0.25 MG PO TABS: ORAL_TABLET | ORAL | Status: DC

## 2013-10-01 MED ORDER — ZOLPIDEM TARTRATE 10 MG PO TABS: 10.0000 mg | ORAL_TABLET | Freq: Every day | ORAL | Status: DC

## 2013-10-01 NOTE — Telephone Encounter (Signed)
Medication refilled at requested pharmacy per Dr Deborra Medina. Pt will need an OV for ANY additional refills. Spoke to pt and advised

## 2013-10-01 NOTE — Telephone Encounter (Signed)
Ok to refill both one time only.

## 2013-11-10 ENCOUNTER — Ambulatory Visit: Payer: BC Managed Care – PPO | Admitting: Family Medicine

## 2013-11-12 ENCOUNTER — Other Ambulatory Visit: Payer: Self-pay | Admitting: *Deleted

## 2013-11-12 ENCOUNTER — Ambulatory Visit: Payer: BC Managed Care – PPO | Admitting: Family Medicine

## 2013-11-12 NOTE — Telephone Encounter (Signed)
Denied- needs to have labs before refilled.

## 2013-11-12 NOTE — Telephone Encounter (Signed)
Pt requesting medication refill. Last ov 12/2012 with future appt scheduled for 10/2013. pls advise

## 2013-11-13 NOTE — Telephone Encounter (Signed)
Spoke to pts wife Secundino Ginger and informed her that an office visit with labs is required for refill; states pt has upcoming appt 02/27 and I informed her refills will be given at that time.

## 2013-11-16 ENCOUNTER — Ambulatory Visit: Payer: BC Managed Care – PPO | Admitting: Family Medicine

## 2013-11-20 ENCOUNTER — Ambulatory Visit (INDEPENDENT_AMBULATORY_CARE_PROVIDER_SITE_OTHER)
Admission: RE | Admit: 2013-11-20 | Discharge: 2013-11-20 | Disposition: A | Discharge status: Home / Self Care | Payer: BC Managed Care – PPO | Source: Ambulatory Visit | Attending: Family Medicine | Admitting: Family Medicine

## 2013-11-20 ENCOUNTER — Ambulatory Visit (INDEPENDENT_AMBULATORY_CARE_PROVIDER_SITE_OTHER): Payer: BC Managed Care – PPO | Admitting: Family Medicine

## 2013-11-20 ENCOUNTER — Encounter: Payer: Self-pay | Admitting: Family Medicine

## 2013-11-20 VITALS — BP 136/76 | HR 106 | Temp 97.6°F | Ht 68.25 in | Wt 223.2 lb

## 2013-11-20 DIAGNOSIS — B351 Tinea unguium: Secondary | ICD-10-CM

## 2013-11-20 DIAGNOSIS — E349 Endocrine disorder, unspecified: Secondary | ICD-10-CM

## 2013-11-20 DIAGNOSIS — M67472 Ganglion, left ankle and foot: Secondary | ICD-10-CM

## 2013-11-20 DIAGNOSIS — R062 Wheezing: Secondary | ICD-10-CM | POA: Insufficient documentation

## 2013-11-20 DIAGNOSIS — M6740 Ganglion, unspecified site: Secondary | ICD-10-CM

## 2013-11-20 DIAGNOSIS — E785 Hyperlipidemia, unspecified: Secondary | ICD-10-CM

## 2013-11-20 DIAGNOSIS — I10 Essential (primary) hypertension: Secondary | ICD-10-CM

## 2013-11-20 DIAGNOSIS — E291 Testicular hypofunction: Secondary | ICD-10-CM

## 2013-11-20 DIAGNOSIS — F319 Bipolar disorder, unspecified: Secondary | ICD-10-CM

## 2013-11-20 DIAGNOSIS — M67439 Ganglion, unspecified wrist: Secondary | ICD-10-CM | POA: Insufficient documentation

## 2013-11-20 MED ORDER — PANTOPRAZOLE SODIUM 40 MG PO TBEC: DELAYED_RELEASE_TABLET | ORAL | Status: DC

## 2013-11-20 MED ORDER — LISINOPRIL 20 MG PO TABS: 20.0000 mg | ORAL_TABLET | Freq: Every day | ORAL | Status: DC

## 2013-11-20 MED ORDER — CICLOPIROX 8 % EX SOLN: Freq: Every day | CUTANEOUS | Status: DC

## 2013-11-20 MED ORDER — SIMVASTATIN 40 MG PO TABS: 40.0000 mg | ORAL_TABLET | Freq: Every day | ORAL | Status: DC

## 2013-11-20 MED ORDER — TESTOSTERONE 20.25 MG/1.25GM (1.62%) TD GEL: 2.0000 "application " | Freq: Every day | TRANSDERMAL | Status: DC

## 2013-11-20 NOTE — Assessment & Plan Note (Signed)
No changes. eRx sent for zocor. Check labs today.

## 2013-11-20 NOTE — Assessment & Plan Note (Signed)
Failed lamisil and he is leary of oral lamisil due to liver enzymes. Will try another topical agent- see AVS

## 2013-11-20 NOTE — Assessment & Plan Note (Signed)
Well controlled. No changes. 

## 2013-11-20 NOTE — Assessment & Plan Note (Signed)
Concerning in a smoker- check Cxr today.

## 2013-11-20 NOTE — Progress Notes (Signed)
48 yo pleasant male with h/o HTN, HLD, low testosterone, here for follow up.  Left hand mass- growing in size.  Sometimes painful when he accidentally hits it.  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  Wheezing- has been wheezing at night for months.  He is a smoker.  Denies any blood in sputum, fever or SOB.  Toe nail fungus- tried OTC lamisil with no improvement.  Seeing Dr. Nicolasa Ducking- going for sleep study next week.  He likes her- feels she is very thorough and understands that his sleep really affects his mood.  HTN- on Lisinopril 20 mg daily. Lab Results  Component Value Date   CREATININE 1.1 01/07/2013   Denies any CP, SOB or LE edema.  No blurred vision.  HLD- takes zocor 40 mg daily.  Due for labs.  Denies myalgias. Lab Results  Component Value Date   CHOL 184 01/07/2013   HDL 38.90* 01/07/2013   LDLCALC 129* 01/07/2013   LDLDIRECT 170.0 10/25/2010   TRIG 82.0 01/07/2013   CHOLHDL 5 01/07/2013    Low T- Had been on on and off testosterone for years.  Currently not taking androgel due to cost.  Feels a definite difference in energy level and libido.  Would like to restart it. Lab Results  Component Value Date   TESTOSTERONE 830.36 01/07/2013   Lab Results  Component Value Date   PSA 1.19 01/07/2013   PSA 0.87 02/14/2011   PSA 0.90 12/14/2008   Lab Results  Component Value Date   WBC 7.7 01/07/2013   HGB 16.2 01/07/2013   HCT 48.7 01/07/2013   MCV 90.8 01/07/2013   PLT 237.0 01/07/2013    Patient Active Problem List   Diagnosis Date Noted  . Ganglion cyst of left foot 11/20/2013  . Wheezing 11/20/2013  . Nail fungus 11/20/2013  . Testosterone deficiency 11/19/2011  . HYPERLIPIDEMIA 10/25/2010  . BIPOLAR DISORDER UNSPECIFIED 10/25/2010  . TOBACCO ABUSE 10/25/2010  . HYPERTENSION 10/25/2010  . GERD 10/25/2010   Past Medical History  Diagnosis Date  . Hyperlipidemia   . Hypertension   . GERD (gastroesophageal reflux disease)   . Bipolar disorder    No past  surgical history on file. History  Substance Use Topics  . Smoking status: Current Every Day Smoker  . Smokeless tobacco: Not on file  . Alcohol Use:    Family History  Problem Relation Age of Onset  . Cancer Mother     melanoma  . Depression Other   . Cancer Other     skin   No Known Allergies Current Outpatient Prescriptions on File Prior to Visit  Medication Sig Dispense Refill  . ALPRAZolam (XANAX) 0.25 MG tablet TAKE 1 TABLET BY MOUTH 3 TIMES A DAY AS NEEDED FOR ANXIETY.  90 tablet  0  . QUEtiapine (SEROQUEL) 300 MG tablet Take two tablets by mouth at bedtime       . sertraline (ZOLOFT) 100 MG tablet Take 100 mg by mouth daily.        Marland Kitchen zolpidem (AMBIEN) 10 MG tablet Take 1 tablet (10 mg total) by mouth at bedtime.  90 tablet  0   No current facility-administered medications on file prior to visit.       The PMH, PSH, Social History, Family History, Medications, and allergies have been reviewed in [facility], and have been updated if relevant.   Review of Systems       See HPI No anxiety or depression Denies mania Appetite  good Wt Readings from Last 3 Encounters:  11/20/13 223 lb 4 oz (101.266 kg)  01/07/13 216 lb (97.977 kg)  11/19/11 218 lb (98.884 kg)    Physical Exam BP 136/76  Pulse 106  Temp(Src) 97.6 F (36.4 C) (Oral)  Ht 5' 8.25" (1.734 m)  Wt 223 lb 4 oz (101.266 kg)  BMI 33.68 kg/m2  SpO2 96%  General:  alert, well-developed, well-nourished, and well-hydrated.   Head:  normocephalic and atraumatic.   Eyes:  vision grossly intact, pupils equal, pupils round, and pupils reactive to light.   Ears:  R ear normal and L ear normal.   Nose:  no external deformity.   Mouth:  good dentition.   Neck:  No deformities, masses, or tenderness noted. Lungs:  Normal respiratory effort, chest expands symmetrically.  +right sided insp wheezes, decrease BS at right base Heart:  Normal rate and regular rhythm. S1 and S2 normal without gallop, murmur, click, rub or  other extra sounds. Psych:  Cognition and judgment appear intact. Alert and cooperative with normal attention span and concentration. No apparent delusions, illusions, hallucinations Skin:  Large freely movable mass on left wrist, NTTP, no erythema Changes of onchomycosis on right great toe nail and 5th toe nail  Assessment and Plan:

## 2013-11-20 NOTE — Patient Instructions (Addendum)
It was great to see you. We will call you with you a referral to hand surgeon.  We will call you with your lab results and your xray results.

## 2013-11-20 NOTE — Assessment & Plan Note (Signed)
Followed by Dr Kapur.  

## 2013-11-20 NOTE — Progress Notes (Signed)
Pre visit review using our clinic review tool, if applicable. No additional management support is needed unless otherwise documented below in the visit note. 

## 2013-11-20 NOTE — Assessment & Plan Note (Signed)
Deteriorated.  Refer to Copy.

## 2013-11-20 NOTE — Assessment & Plan Note (Signed)
Restart testosterone. Check labs today. Orders Placed This Encounter  Procedures  . DG Chest 2 View  . Lipid Panel  . CBC with Differential  . PSA  . Comprehensive metabolic panel  . Testosterone  . Ambulatory referral to Hand Surgery

## 2013-11-23 ENCOUNTER — Other Ambulatory Visit: Payer: Self-pay | Admitting: Family Medicine

## 2013-11-23 ENCOUNTER — Telehealth: Payer: Self-pay | Admitting: Family Medicine

## 2013-11-23 ENCOUNTER — Other Ambulatory Visit (INDEPENDENT_AMBULATORY_CARE_PROVIDER_SITE_OTHER): Payer: BC Managed Care – PPO

## 2013-11-23 DIAGNOSIS — E291 Testicular hypofunction: Secondary | ICD-10-CM

## 2013-11-23 DIAGNOSIS — E349 Endocrine disorder, unspecified: Secondary | ICD-10-CM

## 2013-11-23 LAB — TESTOSTERONE: Testosterone: 254.73 ng/dL — ABNORMAL LOW (ref 350.00–890.00)

## 2013-11-23 LAB — PSA: PSA: 0.77 ng/mL (ref 0.10–4.00)

## 2013-11-23 MED ORDER — ALBUTEROL SULFATE HFA 108 (90 BASE) MCG/ACT IN AERS: 1.0000 | INHALATION_SPRAY | Freq: Four times a day (QID) | RESPIRATORY_TRACT | Status: DC | PRN

## 2013-11-23 NOTE — Telephone Encounter (Signed)
Relevant patient education mailed to patient.  

## 2013-12-11 ENCOUNTER — Other Ambulatory Visit: Payer: Self-pay | Admitting: Orthopedic Surgery

## 2013-12-15 ENCOUNTER — Encounter (HOSPITAL_BASED_OUTPATIENT_CLINIC_OR_DEPARTMENT_OTHER): Payer: Self-pay | Admitting: *Deleted

## 2013-12-15 NOTE — Progress Notes (Signed)
Pt bipolar-controlled-asthma controlled-will try to come in for ekg-did have recent labs-if not will need dos-he had a sleep test last week-has not heard-except oxygen did drop-called for report

## 2013-12-16 ENCOUNTER — Other Ambulatory Visit: Payer: Self-pay

## 2013-12-16 ENCOUNTER — Encounter (HOSPITAL_BASED_OUTPATIENT_CLINIC_OR_DEPARTMENT_OTHER)
Admission: RE | Admit: 2013-12-16 | Discharge: 2013-12-16 | Disposition: A | Discharge status: Home / Self Care | Payer: BC Managed Care – PPO | Source: Ambulatory Visit | Attending: Orthopedic Surgery | Admitting: Orthopedic Surgery

## 2013-12-16 ENCOUNTER — Encounter (HOSPITAL_BASED_OUTPATIENT_CLINIC_OR_DEPARTMENT_OTHER): Payer: Self-pay | Admitting: *Deleted

## 2013-12-16 LAB — BASIC METABOLIC PANEL
BUN: 12 mg/dL (ref 6–23)
CALCIUM: 9.5 mg/dL (ref 8.4–10.5)
CO2: 21 mEq/L (ref 19–32)
Chloride: 97 mEq/L (ref 96–112)
Creatinine, Ser: 0.91 mg/dL (ref 0.50–1.35)
Glucose, Bld: 91 mg/dL (ref 70–99)
POTASSIUM: 4.8 meq/L (ref 3.7–5.3)
Sodium: 133 mEq/L — ABNORMAL LOW (ref 137–147)

## 2013-12-17 ENCOUNTER — Encounter (HOSPITAL_BASED_OUTPATIENT_CLINIC_OR_DEPARTMENT_OTHER): Payer: BC Managed Care – PPO | Admitting: Anesthesiology

## 2013-12-17 ENCOUNTER — Ambulatory Visit (HOSPITAL_BASED_OUTPATIENT_CLINIC_OR_DEPARTMENT_OTHER)
Admission: RE | Admit: 2013-12-17 | Discharge: 2013-12-17 | Disposition: A | Discharge status: Home / Self Care | Payer: BC Managed Care – PPO | Source: Ambulatory Visit | Attending: Orthopedic Surgery | Admitting: Orthopedic Surgery

## 2013-12-17 ENCOUNTER — Ambulatory Visit (HOSPITAL_BASED_OUTPATIENT_CLINIC_OR_DEPARTMENT_OTHER): Payer: BC Managed Care – PPO | Admitting: Anesthesiology

## 2013-12-17 ENCOUNTER — Encounter (HOSPITAL_BASED_OUTPATIENT_CLINIC_OR_DEPARTMENT_OTHER): Payer: Self-pay | Admitting: Anesthesiology

## 2013-12-17 ENCOUNTER — Encounter (HOSPITAL_BASED_OUTPATIENT_CLINIC_OR_DEPARTMENT_OTHER)
Admission: RE | Disposition: A | Discharge status: Home / Self Care | Payer: Self-pay | Source: Ambulatory Visit | Attending: Orthopedic Surgery

## 2013-12-17 DIAGNOSIS — Z01810 Encounter for preprocedural cardiovascular examination: Secondary | ICD-10-CM | POA: Insufficient documentation

## 2013-12-17 DIAGNOSIS — M6740 Ganglion, unspecified site: Secondary | ICD-10-CM | POA: Insufficient documentation

## 2013-12-17 DIAGNOSIS — I10 Essential (primary) hypertension: Secondary | ICD-10-CM | POA: Insufficient documentation

## 2013-12-17 DIAGNOSIS — E785 Hyperlipidemia, unspecified: Secondary | ICD-10-CM | POA: Insufficient documentation

## 2013-12-17 DIAGNOSIS — K219 Gastro-esophageal reflux disease without esophagitis: Secondary | ICD-10-CM | POA: Insufficient documentation

## 2013-12-17 DIAGNOSIS — F319 Bipolar disorder, unspecified: Secondary | ICD-10-CM | POA: Insufficient documentation

## 2013-12-17 DIAGNOSIS — G4730 Sleep apnea, unspecified: Secondary | ICD-10-CM | POA: Insufficient documentation

## 2013-12-17 DIAGNOSIS — F17200 Nicotine dependence, unspecified, uncomplicated: Secondary | ICD-10-CM | POA: Insufficient documentation

## 2013-12-17 DIAGNOSIS — F411 Generalized anxiety disorder: Secondary | ICD-10-CM | POA: Insufficient documentation

## 2013-12-17 DIAGNOSIS — Z01812 Encounter for preprocedural laboratory examination: Secondary | ICD-10-CM | POA: Insufficient documentation

## 2013-12-17 HISTORY — DX: Sleep apnea, unspecified: G47.30

## 2013-12-17 HISTORY — DX: Anxiety disorder, unspecified: F41.9

## 2013-12-17 HISTORY — PX: MASS EXCISION: SHX2000

## 2013-12-17 LAB — POCT HEMOGLOBIN-HEMACUE: HEMOGLOBIN: 16 g/dL (ref 13.0–17.0)

## 2013-12-17 SURGERY — EXCISION MASS
Anesthesia: General | Site: Wrist | Laterality: Left

## 2013-12-17 MED ORDER — LACTATED RINGERS IV SOLN
INTRAVENOUS | Status: DC
  Administered 2013-12-17 (×2): via INTRAVENOUS

## 2013-12-17 MED ORDER — CEFAZOLIN SODIUM-DEXTROSE 2-3 GM-% IV SOLR: 2.0000 g | INTRAVENOUS | Status: DC

## 2013-12-17 MED ORDER — MIDAZOLAM HCL 5 MG/5ML IJ SOLN
INTRAMUSCULAR | Status: DC | PRN
  Administered 2013-12-17: 2 mg via INTRAVENOUS

## 2013-12-17 MED ORDER — FENTANYL CITRATE 0.05 MG/ML IJ SOLN
INTRAMUSCULAR | Status: DC | PRN
  Administered 2013-12-17 (×2): 50 ug via INTRAVENOUS

## 2013-12-17 MED ORDER — MIDAZOLAM HCL 2 MG/2ML IJ SOLN
INTRAMUSCULAR | Status: AC
  Filled 2013-12-17: qty 2

## 2013-12-17 MED ORDER — ONDANSETRON HCL 4 MG/2ML IJ SOLN
INTRAMUSCULAR | Status: DC | PRN
  Administered 2013-12-17: 4 mg via INTRAVENOUS

## 2013-12-17 MED ORDER — MIDAZOLAM HCL 2 MG/2ML IJ SOLN: 1.0000 mg | INTRAMUSCULAR | Status: DC | PRN

## 2013-12-17 MED ORDER — OXYCODONE HCL 5 MG PO TABS
5.0000 mg | ORAL_TABLET | Freq: Once | ORAL | Status: AC | PRN
  Administered 2013-12-17: 5 mg via ORAL

## 2013-12-17 MED ORDER — HYDROMORPHONE HCL PF 1 MG/ML IJ SOLN
INTRAMUSCULAR | Status: AC
  Filled 2013-12-17: qty 1

## 2013-12-17 MED ORDER — DEXAMETHASONE SODIUM PHOSPHATE 4 MG/ML IJ SOLN
INTRAMUSCULAR | Status: DC | PRN
  Administered 2013-12-17: 10 mg via INTRAVENOUS

## 2013-12-17 MED ORDER — FENTANYL CITRATE 0.05 MG/ML IJ SOLN
INTRAMUSCULAR | Status: AC
  Filled 2013-12-17: qty 4

## 2013-12-17 MED ORDER — HYDROMORPHONE HCL PF 1 MG/ML IJ SOLN
0.2500 mg | INTRAMUSCULAR | Status: DC | PRN
  Administered 2013-12-17 (×2): 0.5 mg via INTRAVENOUS

## 2013-12-17 MED ORDER — OXYCODONE-ACETAMINOPHEN 5-325 MG PO TABS: ORAL_TABLET | ORAL | Status: DC

## 2013-12-17 MED ORDER — BUPIVACAINE HCL (PF) 0.25 % IJ SOLN
INTRAMUSCULAR | Status: DC | PRN
  Administered 2013-12-17: 7 mL

## 2013-12-17 MED ORDER — PROPOFOL 10 MG/ML IV BOLUS
INTRAVENOUS | Status: DC | PRN
  Administered 2013-12-17: 200 mg via INTRAVENOUS
  Administered 2013-12-17: 50 mg via INTRAVENOUS

## 2013-12-17 MED ORDER — OXYCODONE HCL 5 MG PO TABS
ORAL_TABLET | ORAL | Status: AC
  Filled 2013-12-17: qty 1

## 2013-12-17 MED ORDER — METOCLOPRAMIDE HCL 5 MG/ML IJ SOLN: 10.0000 mg | Freq: Once | INTRAMUSCULAR | Status: DC | PRN

## 2013-12-17 MED ORDER — CEFAZOLIN SODIUM-DEXTROSE 2-3 GM-% IV SOLR
INTRAVENOUS | Status: DC | PRN
  Administered 2013-12-17: 2 g via INTRAVENOUS

## 2013-12-17 MED ORDER — FENTANYL CITRATE 0.05 MG/ML IJ SOLN: 50.0000 ug | INTRAMUSCULAR | Status: DC | PRN

## 2013-12-17 MED ORDER — CEFAZOLIN SODIUM-DEXTROSE 2-3 GM-% IV SOLR
INTRAVENOUS | Status: AC
  Filled 2013-12-17: qty 50

## 2013-12-17 MED ORDER — CHLORHEXIDINE GLUCONATE 4 % EX LIQD: 60.0000 mL | Freq: Once | CUTANEOUS | Status: DC

## 2013-12-17 MED ORDER — LIDOCAINE HCL (CARDIAC) 20 MG/ML IV SOLN
INTRAVENOUS | Status: DC | PRN
  Administered 2013-12-17: 40 mg via INTRAVENOUS

## 2013-12-17 MED ORDER — OXYCODONE HCL 5 MG/5ML PO SOLN
5.0000 mg | Freq: Once | ORAL | Status: AC | PRN
Start: 2013-12-17 — End: 2013-12-17

## 2013-12-17 SURGICAL SUPPLY — 56 items
APL SKNCLS STERI-STRIP NONHPOA (GAUZE/BANDAGES/DRESSINGS)
BANDAGE COBAN STERILE 2 (GAUZE/BANDAGES/DRESSINGS) IMPLANT
BANDAGE ELASTIC 3 VELCRO ST LF (GAUZE/BANDAGES/DRESSINGS) ×1 IMPLANT
BANDAGE GAUZE STRT 1 STR LF (GAUZE/BANDAGES/DRESSINGS) IMPLANT
BENZOIN TINCTURE PRP APPL 2/3 (GAUZE/BANDAGES/DRESSINGS) IMPLANT
BLADE MINI RND TIP GREEN BEAV (BLADE) IMPLANT
BLADE SURG 15 STRL LF DISP TIS (BLADE) ×2 IMPLANT
BLADE SURG 15 STRL SS (BLADE) ×4
BNDG CMPR 9X4 STRL LF SNTH (GAUZE/BANDAGES/DRESSINGS) ×1
BNDG CMPR MD 5X2 ELC HKLP STRL (GAUZE/BANDAGES/DRESSINGS)
BNDG COHESIVE 1X5 TAN STRL LF (GAUZE/BANDAGES/DRESSINGS) IMPLANT
BNDG CONFORM 2 STRL LF (GAUZE/BANDAGES/DRESSINGS) IMPLANT
BNDG ELASTIC 2 VLCR STRL LF (GAUZE/BANDAGES/DRESSINGS) IMPLANT
BNDG ESMARK 4X9 LF (GAUZE/BANDAGES/DRESSINGS) ×1 IMPLANT
BNDG GAUZE ELAST 4 BULKY (GAUZE/BANDAGES/DRESSINGS) ×1 IMPLANT
BNDG PLASTER X FAST 3X3 WHT LF (CAST SUPPLIES) IMPLANT
BNDG PLSTR 9X3 FST ST WHT (CAST SUPPLIES)
CHLORAPREP W/TINT 26ML (MISCELLANEOUS) ×2 IMPLANT
CORDS BIPOLAR (ELECTRODE) ×2 IMPLANT
COVER MAYO STAND STRL (DRAPES) ×2 IMPLANT
COVER TABLE BACK 60X90 (DRAPES) ×2 IMPLANT
CUFF TOURNIQUET SINGLE 18IN (TOURNIQUET CUFF) ×2 IMPLANT
DRAPE EXTREMITY T 121X128X90 (DRAPE) ×2 IMPLANT
DRAPE SURG 17X23 STRL (DRAPES) ×2 IMPLANT
GAUZE XEROFORM 1X8 LF (GAUZE/BANDAGES/DRESSINGS) ×2 IMPLANT
GLOVE BIO SURGEON STRL SZ 6.5 (GLOVE) ×1 IMPLANT
GLOVE BIO SURGEON STRL SZ7.5 (GLOVE) ×2 IMPLANT
GLOVE BIOGEL M STRL SZ7.5 (GLOVE) ×1 IMPLANT
GLOVE BIOGEL PI IND STRL 7.0 (GLOVE) IMPLANT
GLOVE BIOGEL PI IND STRL 8 (GLOVE) ×1 IMPLANT
GLOVE BIOGEL PI INDICATOR 7.0 (GLOVE) ×1
GLOVE BIOGEL PI INDICATOR 8 (GLOVE) ×1
GOWN STRL REUS W/ TWL LRG LVL3 (GOWN DISPOSABLE) ×1 IMPLANT
GOWN STRL REUS W/TWL LRG LVL3 (GOWN DISPOSABLE) ×2
GOWN STRL REUS W/TWL XL LVL3 (GOWN DISPOSABLE) ×2 IMPLANT
NDL HYPO 25X1 1.5 SAFETY (NEEDLE) ×1 IMPLANT
NEEDLE HYPO 25X1 1.5 SAFETY (NEEDLE) ×2 IMPLANT
NS IRRIG 1000ML POUR BTL (IV SOLUTION) ×2 IMPLANT
PACK BASIN DAY SURGERY FS (CUSTOM PROCEDURE TRAY) ×2 IMPLANT
PAD CAST 3X4 CTTN HI CHSV (CAST SUPPLIES) IMPLANT
PAD CAST 4YDX4 CTTN HI CHSV (CAST SUPPLIES) IMPLANT
PADDING CAST ABS 4INX4YD NS (CAST SUPPLIES) ×1
PADDING CAST ABS COTTON 4X4 ST (CAST SUPPLIES) ×1 IMPLANT
PADDING CAST COTTON 3X4 STRL (CAST SUPPLIES) ×2
PADDING CAST COTTON 4X4 STRL (CAST SUPPLIES)
SPONGE GAUZE 4X4 12PLY (GAUZE/BANDAGES/DRESSINGS) ×2 IMPLANT
STOCKINETTE 4X48 STRL (DRAPES) ×2 IMPLANT
SUT ETHILON 3 0 PS 1 (SUTURE) IMPLANT
SUT ETHILON 4 0 PS 2 18 (SUTURE) ×2 IMPLANT
SUT ETHILON 5 0 P 3 18 (SUTURE)
SUT NYLON ETHILON 5-0 P-3 1X18 (SUTURE) IMPLANT
SUT VIC AB 4-0 P2 18 (SUTURE) ×1 IMPLANT
SYR BULB 3OZ (MISCELLANEOUS) ×2 IMPLANT
SYR CONTROL 10ML LL (SYRINGE) ×2 IMPLANT
TOWEL OR 17X24 6PK STRL BLUE (TOWEL DISPOSABLE) ×3 IMPLANT
UNDERPAD 30X30 INCONTINENT (UNDERPADS AND DIAPERS) ×2 IMPLANT

## 2013-12-17 NOTE — Anesthesia Preprocedure Evaluation (Signed)
Anesthesia Evaluation  Patient identified by MRN, date of birth, ID band Patient awake    Reviewed: Allergy & Precautions, H&P , NPO status , Patient's Chart, lab work & pertinent test results, reviewed documented beta blocker date and time   Airway Mallampati: II TM Distance: >3 FB Neck ROM: full    Dental   Pulmonary neg pulmonary ROS, sleep apnea , Current Smoker,  breath sounds clear to auscultation        Cardiovascular hypertension, negative cardio ROS  Rhythm:regular     Neuro/Psych PSYCHIATRIC DISORDERS Anxiety Bipolar Disorder negative neurological ROS     GI/Hepatic Neg liver ROS, GERD-  Medicated and Controlled,  Endo/Other  negative endocrine ROS  Renal/GU negative Renal ROS  negative genitourinary   Musculoskeletal   Abdominal   Peds  Hematology negative hematology ROS (+)   Anesthesia Other Findings See surgeon's H&P   Reproductive/Obstetrics negative OB ROS                           Anesthesia Physical Anesthesia Plan  ASA: II  Anesthesia Plan: General   Post-op Pain Management:    Induction: Intravenous  Airway Management Planned: LMA  Additional Equipment:   Intra-op Plan:   Post-operative Plan:   Informed Consent: I have reviewed the patients History and Physical, chart, labs and discussed the procedure including the risks, benefits and alternatives for the proposed anesthesia with the patient or authorized representative who has indicated his/her understanding and acceptance.   Dental Advisory Given  Plan Discussed with: CRNA and Surgeon  Anesthesia Plan Comments:         Anesthesia Quick Evaluation

## 2013-12-17 NOTE — Brief Op Note (Signed)
12/17/2013  10:50 AM  PATIENT:  [name]  48 y.o. male  PRE-OPERATIVE DIAGNOSIS:  LEFT WRIST VOLAR GANGLION  POST-OPERATIVE DIAGNOSIS:  LEFT WRIST VOLAR GANGLION  PROCEDURE:  Procedure(s): EXCISION MASS LEFT WRIST (Left)  SURGEON:  Surgeon(s) and Role:    * Tennis Must, MD - Primary  PHYSICIAN ASSISTANT:   ASSISTANTS: none   ANESTHESIA:   general  EBL:  Total I/O In: 200 [I.V.:200] Out: -   BLOOD ADMINISTERED:none  DRAINS: none   LOCAL MEDICATIONS USED:  MARCAINE     SPECIMEN:  Source of Specimen:  left wrist  DISPOSITION OF SPECIMEN:  PATHOLOGY  COUNTS:  YES  TOURNIQUET:   Total Tourniquet Time Documented: Upper Arm (Left) - 23 minutes Total: Upper Arm (Left) - 23 minutes   DICTATION: .Other Dictation: Dictation Number [id number]  PLAN OF CARE: Discharge to home after PACU  PATIENT DISPOSITION:  PACU - hemodynamically stable.

## 2013-12-17 NOTE — H&P (Signed)
[name] is an 48 y.o. male.   Chief Complaint: left wrist mass HPI: 48 yo rhd male with volar wrist mass x ~ 3 years.  Gradually increased in size.  It is bothersome because he bumps it frequently at work.  He wishes to have it removed.  Past Medical History  Diagnosis Date  . Hyperlipidemia   . Hypertension   . GERD (gastroesophageal reflux disease)   . Bipolar disorder   . Sleep apnea     test 3/15-waiting results-was told oxygen did drop  . Anxiety     Past Surgical History  Procedure Laterality Date  . Septoplasty  1990  . Bronchoscopy      Family History  Problem Relation Age of Onset  . Cancer Mother     melanoma  . Depression Other   . Cancer Other     skin   Social History:  reports that he has been smoking.  He does not have any smokeless tobacco history on file. He reports that he does not drink alcohol or use illicit drugs.  Allergies: No Known Allergies  Medications Prior to Admission  Medication Sig Dispense Refill  . albuterol (PROAIR HFA) 108 (90 BASE) MCG/ACT inhaler Inhale 1-2 puffs into the lungs every 6 (six) hours as needed for wheezing or shortness of breath.  1 Inhaler  1  . ALPRAZolam (XANAX) 0.25 MG tablet 1 mg 3 (three) times daily as needed. TAKE 1 TABLET BY MOUTH 3 TIMES A DAY AS NEEDED FOR ANXIETY.      . diphenhydrAMINE (BENADRYL) 25 MG tablet Take 25 mg by mouth every 6 (six) hours as needed.      Marland Kitchen lisinopril (PRINIVIL,ZESTRIL) 20 MG tablet Take 1 tablet (20 mg total) by mouth daily.  30 tablet  3  . pantoprazole (PROTONIX) 40 MG tablet 2 (two) times daily. TAKE 1 TABLET By mouth twice daily      . QUEtiapine (SEROQUEL) 300 MG tablet 300 mg. Take two tablets by mouth at bedtime      . sertraline (ZOLOFT) 100 MG tablet Take 100 mg by mouth daily.       . simvastatin (ZOCOR) 40 MG tablet Take 1 tablet (40 mg total) by mouth at bedtime.  30 tablet  3  . tamsulosin (FLOMAX) 0.4 MG CAPS capsule Take 0.4 mg by mouth daily after supper.       . Testosterone (ANDROGEL) 20.25 MG/1.25GM (1.62%) GEL Place 2 application onto the skin daily.  75 g  0  . zolpidem (AMBIEN) 10 MG tablet Take 1 tablet (10 mg total) by mouth at bedtime.  90 tablet  0  . ciclopirox (PENLAC) 8 % solution Apply topically at bedtime. Apply over nail and surrounding skin. Apply daily over previous coat. After seven (7) days, may remove with alcohol and continue cycle.  6.6 mL  0    Results for orders placed during the hospital encounter of 12/17/13 (from the past 48 hour(s))  BASIC METABOLIC PANEL     Status: Abnormal   Collection Time    12/16/13 10:10 AM      Result Value Ref Range   Sodium 133 (*) 137 - 147 mEq/L   Potassium 4.8  3.7 - 5.3 mEq/L   Chloride 97  96 - 112 mEq/L   CO2 21  19 - 32 mEq/L   Glucose, Bld 91  70 - 99 mg/dL   BUN 12  6 - 23 mg/dL   Creatinine, Ser 0.91  0.50 -  1.35 mg/dL   Calcium 9.5  8.4 - 10.5 mg/dL   GFR calc non Af Amer >90  >90 mL/min   GFR calc Af Amer >90  >90 mL/min   Comment: (NOTE)     The eGFR has been calculated using the CKD EPI equation.     This calculation has not been validated in all clinical situations.     eGFR's persistently <90 mL/min signify possible Chronic Kidney     Disease.  POCT HEMOGLOBIN-HEMACUE     Status: None   Collection Time    12/17/13  8:18 AM      Result Value Ref Range   Hemoglobin 16.0  13.0 - 17.0 g/dL    No results found.   A comprehensive review of systems was negative except for: Respiratory: positive for shortness of breath Behavioral/Psych: positive for anxiety, depression and sleep disturbance  Blood pressure 125/86, pulse 108, temperature 97.7 F (36.5 C), temperature source Oral, resp. rate 16, height _0  (1.727 m), weight 100.472 kg (221 lb 8 oz), SpO2 97.00%.  General appearance: alert, cooperative and appears stated age Head: Normocephalic, without obvious abnormality, atraumatic Neck: supple, symmetrical, trachea midline Resp: clear to auscultation  bilaterally Cardio: regular rate and rhythm GI: non tender Extremities: intact sensation and capillary refill all digits.  +epl/fpl/io.  mass volar aspect left wrist.  no skin changes. Pulses: 2+ and symmetric Skin: Skin color, texture, turgor normal. No rashes or lesions Neurologic: Grossly normal Incision/Wound: none  Assessment/Plan Left wrist volar ganglion cyst.  Non operative and operative treatment options were discussed with the patient and patient wishes to proceed with operative treatment. Risks, benefits, and alternatives of surgery were discussed and the patient agrees with the plan of care.   Morgin Halls R 12/17/2013, 8:32 AM

## 2013-12-17 NOTE — Anesthesia Postprocedure Evaluation (Signed)
Anesthesia Post Note  Patient: [name]  Procedure(s) Performed: Procedure(s) (LRB): EXCISION MASS LEFT WRIST (Left)  Anesthesia type: General  Patient location: PACU  Post pain: Pain level controlled  Post assessment: Patient's Cardiovascular Status Stable  Last Vitals:  Filed Vitals:   12/17/13 1130  BP: 109/82  Pulse: 88  Temp:   Resp: 17    Post vital signs: Reviewed and stable  Level of consciousness: alert  Complications: No apparent anesthesia complications

## 2013-12-17 NOTE — Transfer of Care (Signed)
Immediate Anesthesia Transfer of Care Note  Patient: [name]  Procedure(s) Performed: Procedure(s): EXCISION MASS LEFT WRIST (Left)  Patient Location: PACU  Anesthesia Type:General  Level of Consciousness: awake, alert , oriented and patient cooperative  Airway & Oxygen Therapy: Patient Spontanous Breathing and Patient connected to face mask oxygen  Post-op Assessment: Report given to PACU RN and Post -op Vital signs reviewed and stable  Post vital signs: Reviewed and stable  Complications: No apparent anesthesia complications

## 2013-12-17 NOTE — Op Note (Signed)
[id number] 

## 2013-12-17 NOTE — Anesthesia Procedure Notes (Signed)
Procedure Name: LMA Insertion Date/Time: 12/17/2013 10:12 AM Performed by: Toula Moos L Pre-anesthesia Checklist: Patient identified, Emergency Drugs available, Suction available, Patient being monitored and Timeout performed Patient Re-evaluated:Patient Re-evaluated prior to inductionOxygen Delivery Method: Circle System Utilized Preoxygenation: Pre-oxygenation with 100% oxygen Intubation Type: IV induction Ventilation: Mask ventilation without difficulty LMA: LMA inserted LMA Size: 4.0 Number of attempts: 1 Airway Equipment and Method: bite block Placement Confirmation: positive ETCO2 and breath sounds checked- equal and bilateral Tube secured with: Tape Dental Injury: Teeth and Oropharynx as per pre-operative assessment

## 2013-12-17 NOTE — Discharge Instructions (Addendum)

## 2013-12-18 ENCOUNTER — Encounter (HOSPITAL_BASED_OUTPATIENT_CLINIC_OR_DEPARTMENT_OTHER): Payer: Self-pay | Admitting: Orthopedic Surgery

## 2013-12-18 NOTE — Op Note (Signed)
NAMECheskel, Silverio                    ACCOUNT NO.:  [id number]  MEDICAL RECORD NO.:  [id number]  LOCATION:                                 FACILITY:  PHYSICIAN:  Leanora Cover, MD             DATE OF BIRTH:  DATE OF PROCEDURE:  12/17/2013 DATE OF DISCHARGE:                              OPERATIVE REPORT   PREOPERATIVE DIAGNOSIS:  Left wrist volar ganglion cyst.  POSTOPERATIVE DIAGNOSIS:  Left wrist volar ganglion cyst.  PROCEDURE:  Excision of ganglion cyst, left volar wrist.  SURGEON:  Leanora Cover, MD  ASSISTANT:  None.  ANESTHESIA:  General.  IV FLUIDS:  Per Anesthesia flow sheet.  ESTIMATED BLOOD LOSS:  Minimal.  COMPLICATIONS:  None.  SPECIMENS:  Left wrist volar ganglion to Pathology.  TOURNIQUET TIME:  23 minutes.  DISPOSITION:  Stable to PACU.  INDICATIONS:  Mr. Penrose is a 48 year old male who has noted a mass on volar aspect of his left wrist for approximately 3 years.  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  He wishes to have it excised.  Risks, benefits, and alternatives surgery were discussed including risk of blood loss; infection; damage to nerves, vessels, tendons, ligaments, bone; failure of surgery; need for additional surgery; complications with wound healing; and recurrence of cyst.  He voiced understanding of these risks and elected to proceed.  OPERATIVE COURSE:  After being identified preoperatively by myself, the patient and I agreed upon the procedure and site of the procedure. Surgical site was marked.  The risks, benefits, and alternatives of surgery were reviewed and he wished to proceed.  Surgical consent had been signed.  He was given IV Ancef as preoperative antibiotic prophylaxis.  He was transferred to the operating room, placed on the operating table in supine position, with the left upper extremity on an arm board.  General anesthesia was induced by the anesthesiologist.  The left upper  extremity was prepped and draped in normal sterile orthopedic fashion.  Surgical pause was performed between surgeons, Anesthesia, and operating staff, and all were in agreement as to the patient, procedure, and site of the procedure.  Tourniquet at the proximal aspect of the Extremity was inflated to 250 mmHg after exsanguination of the limb with Esmarch bandage.  A zigzag incision was made over the mass.  It was carried into subcutaneous tissues by spreading technique.  Bipolar electrocautery was used to obtain hemostasis.  The mass was freed of its soft tissue adherence.  The vein over top of the cyst was mobilized and protected.  The cyst was coming through the fascia.  The fascia was incised.  The radial artery was identified and protected throughout the case.  Superficial palmar branch of the radial artery was identified and protected throughout the case.  The cyst was coming right through distal to the branch point.  The cyst was deflated and mobilized.  It was able to be brought to the side of the artery.  It was traced down to the capsule.  The stalk was traced.  Stalk was then removed.  The cyst was sent to Pathology for examination.  A 4-0 Vicryl suture was used to place a figure-of-eight stitch in the rent in the capsule.  The area was treated with bipolar electrocautery as well to try and induce healing. The wound was copiously irrigated with sterile saline.  Hemostasis was achieved with the bipolar electrocautery.  The wound was closed with 4-0 nylon in a horizontal mattress fashion.  The wound was then injected with 7 mL of 0.25% plain Marcaine to aid in postoperative analgesia.  It was dressed with sterile Xeroform, 4x4s, and wrapped with Kerlix.  A volar splint was placed and wrapped with Kerlix and Ace bandage. Tourniquet was deflated at 23 minutes.  Fingertips were pink with brisk capillary refill after deflation of the tourniquet.  The operative drapes were broken down.   The patient was awoken from anesthesia safely. He was transferred back to stretcher and taken to the PACU in stable condition.  I will see him back in the office in 1 week for postoperative followup.  I will give him Percocet 5/325, 1 to 2 p.o. q.6 hours p.r.n. pain, dispensed #30.     Leanora Cover, MD     KK/MEDQ  D:  12/17/2013  T:  12/18/2013  Job:  [id number]

## 2014-01-12 ENCOUNTER — Other Ambulatory Visit: Payer: Self-pay | Admitting: *Deleted

## 2014-01-12 MED ORDER — TESTOSTERONE 20.25 MG/1.25GM (1.62%) TD GEL: 2.0000 "application " | Freq: Every day | TRANSDERMAL | Status: DC

## 2014-01-12 NOTE — Telephone Encounter (Signed)
Spoke to pt and informed him Rx has been called in to requested pharmacy 

## 2014-01-12 NOTE — Telephone Encounter (Signed)
Pt requesting medication refill. Last ov 10/2013 with no future appts scheduled. pls advise 

## 2014-03-16 ENCOUNTER — Other Ambulatory Visit: Payer: Self-pay | Admitting: Family Medicine

## 2014-05-14 ENCOUNTER — Other Ambulatory Visit: Payer: Self-pay | Admitting: Family Medicine

## 2014-06-18 ENCOUNTER — Other Ambulatory Visit: Payer: Self-pay | Admitting: Family Medicine

## 2014-06-18 NOTE — Telephone Encounter (Signed)
Pt requesting medication refill. Last f/u appt 10/2013. Ok to fill per Dr Deborra Medina. Rx to be faxed to requested pharmacy before end of day.

## 2014-06-21 ENCOUNTER — Other Ambulatory Visit: Payer: Self-pay | Admitting: *Deleted

## 2014-08-05 ENCOUNTER — Other Ambulatory Visit: Payer: Self-pay | Admitting: Family Medicine

## 2014-09-13 ENCOUNTER — Other Ambulatory Visit: Payer: Self-pay | Admitting: Family Medicine

## 2014-09-20 ENCOUNTER — Encounter: Payer: Self-pay | Admitting: Family Medicine

## 2014-09-20 ENCOUNTER — Ambulatory Visit (INDEPENDENT_AMBULATORY_CARE_PROVIDER_SITE_OTHER)
Admission: RE | Admit: 2014-09-20 | Discharge: 2014-09-20 | Disposition: A | Discharge status: Home / Self Care | Payer: BC Managed Care – PPO | Source: Ambulatory Visit | Attending: Family Medicine | Admitting: Family Medicine

## 2014-09-20 ENCOUNTER — Ambulatory Visit (INDEPENDENT_AMBULATORY_CARE_PROVIDER_SITE_OTHER): Payer: BC Managed Care – PPO | Admitting: Family Medicine

## 2014-09-20 VITALS — BP 122/84 | HR 76 | Temp 98.0°F | Wt 218.0 lb

## 2014-09-20 DIAGNOSIS — R0789 Other chest pain: Secondary | ICD-10-CM

## 2014-09-20 MED ORDER — OXYCODONE-ACETAMINOPHEN 5-325 MG PO TABS: ORAL_TABLET | ORAL | Status: DC

## 2014-09-20 NOTE — Progress Notes (Signed)
Pre visit review using our clinic review tool, if applicable. No additional management support is needed unless otherwise documented below in the visit note.  H/o old back injury and had a few oxycodone left over from that.  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   Was on the bed of the truck, trying to get in the vehicle on the bed. Fell off, onto ground, not concrete.  No LOC but "had my breath knocked out." Fell only L side. L hip, L ribs and L forearm.  L hip pain resolved now.  Pain with a deep breath, cough. No R sided chest pain. Chest pain some better with an ace wrap.   H/o L rib rx in the past.   L chest pain is most bothersome for patient.   Weight bearing w/o pain.   Meds, vitals, and allergies reviewed.   ROS: See HPI.  Otherwise, noncontributory.  nad ncat Mmm Neck supple, no midline neck pain, normal ROM Back w/o midline pain on palpation L lower chest wall ttp anteriorly Ctab, pain with deep breath but some better with external compression during a deep breath.  rrr Normal ROM L shoulder elbow and wrist Small bruising noted on the L medial forearm but distally nv intact Not ttp on bony prominences on the LUE abd soft Speech wnl

## 2014-09-20 NOTE — Patient Instructions (Signed)
Go to the lab on the way out.  We'll contact you with your xray report. Take the oxycodone for pain as needed. It can make you drowsy.  Use a pillow and give it a hug when you take a deep breath or cough/sneeze.  Take care.

## 2014-09-20 NOTE — Assessment & Plan Note (Signed)
Hip and forearm pain improved, should resolve.  Not likely to be a PTX but with his pain would check CXR today.  D/w pt.  Likely rib fx or bruising, should slowly resolve.  Activity as tolerated, d/w pt about using a pillow for deep breathing.  Sedation caution on oxycodone.  F/u prn.  Routed to PCP as FYI.

## 2014-09-23 ENCOUNTER — Other Ambulatory Visit: Payer: Self-pay | Admitting: Family Medicine

## 2014-09-23 ENCOUNTER — Telehealth: Payer: Self-pay

## 2014-09-23 NOTE — Telephone Encounter (Signed)
Pt request rx oxycodone apap. Pt will be out of med on 09/25/14. Pt was advised on result note for imaging that pt can take 2.5 - 3 tabs bid not to exceed taking 5 -6 tabs daily. Pt states he is taking 2 tabs q6h but pt states he is not taking more than 6 tabs in 24 hours. Pt said can be at office in 15 mins to pick up rx.Please advise.

## 2014-09-23 NOTE — Telephone Encounter (Signed)
Spoke to pt and advised per [facility]. Pt advised Dr Deborra Medina will review and we will contact him whether approved/denied

## 2014-09-23 NOTE — Telephone Encounter (Signed)
I don't feel comfortable refilling this, will defer to Dr. Deborra Medina. Seems like a lot for a broken rib.

## 2014-09-27 MED ORDER — OXYCODONE-ACETAMINOPHEN 5-325 MG PO TABS: ORAL_TABLET | ORAL | Status: DC

## 2014-09-27 NOTE — Addendum Note (Signed)
Addended by: Tonia Ghent on: 09/27/2014 01:57 PM   Modules accepted: Orders

## 2014-09-27 NOTE — Telephone Encounter (Signed)
Patient's wife says that with the increase in dosage that you allowed, patient ran out of medication and no other physician would agree to refill at that dosage.  Therefore, he was short of medication all weekend.  Wife says he has been trying to take it easy but really is not a lot better.  Does he need another OV or will you refill the medication?  Your last note on the x-ray is below and patient was advised. Notes Recorded by Tonia Ghent, MD on 09/21/2014 at 3:08 PM He can try taking up to 2.5-3 tabs of oxycodone at a time, but only twice a day, ie max 5-6 pills per day. He's going to have to limit his exertion o/w due to pain.  There isn't another med that we can call in o/w.

## 2014-09-27 NOTE — Telephone Encounter (Signed)
Wife advised that Rx is ready for pickup and that if that isn't helping, he'll need to be rechecked.  Rx left at front desk for pick up.

## 2014-09-27 NOTE — Telephone Encounter (Signed)
Spoke to pt and advised that per speaking with Dr Deborra Medina his Rx has been denied. Pt states that he was advised that he could take up to 3tabs every 6rs. I advised pt that if he is still in a large amount of pain, he may need to be re-evaluated. Pt is requesting a cb to advise what next step should be.

## 2014-09-27 NOTE — Telephone Encounter (Signed)
Pt spouse called and would like a call back from Dr. Damita Dunnings assistant regarding this matter because she is the one who told the patient to take more of the medication than prescribed.   [name] # [phone]

## 2014-09-27 NOTE — Telephone Encounter (Addendum)
rx printed.  The prev advice still stands.  He can take 2 tabs every 8 hours or he can try taking up to 2.5-3 tabs of oxycodone at a time, but only twice a day, ie max 5-6 pills per day. He's going to have to limit his exertion o/w due to pain.  If that isn't helping, then he'll need to be rechecked.

## 2014-10-07 ENCOUNTER — Ambulatory Visit (INDEPENDENT_AMBULATORY_CARE_PROVIDER_SITE_OTHER)
Admission: RE | Admit: 2014-10-07 | Discharge: 2014-10-07 | Disposition: A | Discharge status: Home / Self Care | Payer: BLUE CROSS/BLUE SHIELD | Source: Ambulatory Visit | Attending: Family Medicine | Admitting: Family Medicine

## 2014-10-07 ENCOUNTER — Ambulatory Visit (INDEPENDENT_AMBULATORY_CARE_PROVIDER_SITE_OTHER): Payer: BLUE CROSS/BLUE SHIELD | Admitting: Family Medicine

## 2014-10-07 ENCOUNTER — Encounter: Payer: Self-pay | Admitting: Family Medicine

## 2014-10-07 VITALS — BP 116/74 | HR 92 | Temp 98.2°F | Wt 227.0 lb

## 2014-10-07 DIAGNOSIS — R0789 Other chest pain: Secondary | ICD-10-CM

## 2014-10-07 MED ORDER — OXYCODONE-ACETAMINOPHEN 5-325 MG PO TABS: ORAL_TABLET | ORAL | Status: DC

## 2014-10-07 NOTE — Assessment & Plan Note (Signed)
Re-rx oxycodone with sedation.  Recheck CXR today.  Will likely just take time to heal up, ie weeks from now.  D/w pt.   Still with pain but is improved some from prev.  D/w pt about prev CXR with atelectasis noted, but not PTX.  Okay for outpatient f/u.

## 2014-10-07 NOTE — Progress Notes (Signed)
Pre visit review using our clinic review tool, if applicable. No additional management support is needed unless otherwise documented below in the visit note.  Still with L sided chest wall pain.  Anterior pain is improved some with regular breathing.  Cough laugh sneeze reaching cause more pain.  He has some lower thorax pain, along the lower ribs.  Still w/o R CP.  He has altered his bed situation, lowered it to make it easier to get in and out.   Taking oxycodone helps some.  Taking 2 tabs every 8 hours was helping recently.  He could occ take 1 pill at a time, if he was having a good day.    Meds, vitals, and allergies reviewed.   ROS: See HPI.  Otherwise, noncontributory.  nad ncat Chest w/o bruising but ttp on L side of chest rrr Ctab, no focal dec in BS abd soft Repeat CXR pending.

## 2014-10-07 NOTE — Patient Instructions (Signed)
Go to the lab on the way out.  We'll contact you with your xray report. Use the oxycodone for now and you can be as active as your pain allows.  I still expect this to take weeks to improve a lot.  Take care.  Glad to see you.

## 2014-10-13 ENCOUNTER — Other Ambulatory Visit: Payer: Self-pay | Admitting: Family Medicine

## 2014-10-13 NOTE — Telephone Encounter (Signed)
Received refill request electronically from pharmacy. Last refill 10/07/14 #30, last office visit same day. Is it okay to refill medication?

## 2014-10-14 NOTE — Telephone Encounter (Signed)
Printed.  Thanks.  

## 2014-10-14 NOTE — Telephone Encounter (Signed)
Patient advised.  Rx left at front desk for pick up. 

## 2014-10-18 ENCOUNTER — Other Ambulatory Visit: Payer: Self-pay | Admitting: Family Medicine

## 2014-10-22 ENCOUNTER — Other Ambulatory Visit: Payer: Self-pay | Admitting: Family Medicine

## 2014-10-22 NOTE — Telephone Encounter (Signed)
Electronic refill request. Last Filled:    30 tablet 0 RF on 10/14/2014  Please advise.

## 2014-10-24 NOTE — Telephone Encounter (Signed)
Printed.  Thanks.  

## 2014-10-25 NOTE — Telephone Encounter (Signed)
Patient advised.  Rx left at front desk for pick up. 

## 2014-11-03 ENCOUNTER — Other Ambulatory Visit: Payer: Self-pay | Admitting: Family Medicine

## 2014-11-03 NOTE — Telephone Encounter (Signed)
Received refill request electronically from pharmacy. Last refill 10/24/14 #30, last office visit 10/07/14. Is it okay to refill medication?

## 2014-11-04 ENCOUNTER — Other Ambulatory Visit: Payer: Self-pay | Admitting: Family Medicine

## 2014-11-04 NOTE — Telephone Encounter (Signed)
Wife advised.  Rx left at front desk for pick up.  

## 2014-11-04 NOTE — Telephone Encounter (Signed)
Thanks. Printed.

## 2014-11-10 ENCOUNTER — Other Ambulatory Visit: Payer: Self-pay | Admitting: Family Medicine

## 2014-12-02 ENCOUNTER — Encounter: Payer: Self-pay | Admitting: Family Medicine

## 2014-12-02 ENCOUNTER — Ambulatory Visit (INDEPENDENT_AMBULATORY_CARE_PROVIDER_SITE_OTHER): Payer: BLUE CROSS/BLUE SHIELD | Admitting: Family Medicine

## 2014-12-02 VITALS — BP 110/80 | HR 104 | Temp 97.8°F | Wt 222.8 lb

## 2014-12-02 DIAGNOSIS — F3170 Bipolar disorder, currently in remission, most recent episode unspecified: Secondary | ICD-10-CM

## 2014-12-02 MED ORDER — ZOLPIDEM TARTRATE 10 MG PO TABS: 10.0000 mg | ORAL_TABLET | Freq: Every day | ORAL | Status: DC

## 2014-12-02 NOTE — Patient Instructions (Signed)
I'll check with Dr. Deborra Medina in the meantime.  Take care.

## 2014-12-02 NOTE — Progress Notes (Signed)
Pre visit review using our clinic review tool, if applicable. No additional management support is needed unless otherwise documented below in the visit note.  Had seen psych prev.  He was concerned about med options. He has an appointment Dr. Toy Care for a second opinion, but not until 06/2015 (7 months from now).   He is out of [place] as of today.  Still on sertraline and seroquel and xanax.   He has about 2 weeks of xanax left.  Has a refill on sertraline and seroquel.    Mood is "stable" per patient.  No SI/HI.  Sleeping well with combination of seroquel and ambien.  No parasomnias on the medicine.  No manic sx now.    Chest wall pain is resolved, except for occ twinges that are positionally dependent.    Meds, vitals, and allergies reviewed.   ROS: See HPI.  Otherwise, noncontributory.  Nad, pleasant in conversation Affect appears wnl, appopriate Judgement appears normal Mmm rrr ctab abd soft Ext w/o edema

## 2014-12-03 ENCOUNTER — Telehealth: Payer: Self-pay | Admitting: Family Medicine

## 2014-12-03 NOTE — Assessment & Plan Note (Signed)
Complicated by insomnia.  I wrote for his [place] today.  I told him this was a short term plan to prevent acute decompensation.  I will d/w PCP.  I told patient I wasn't sure what PCP would have to offer.   It is noted that patient is still on meds listed above, affect was normal today, and has f/u with psych pending, but not in the near future.  He was grateful and thanked me.  I will d/w PCP.

## 2014-12-03 NOTE — Telephone Encounter (Signed)
emmi emailed °

## 2014-12-14 ENCOUNTER — Other Ambulatory Visit: Payer: Self-pay | Admitting: *Deleted

## 2014-12-14 NOTE — Telephone Encounter (Signed)
Faxed refill request. Last Filled:   ?   Please advise.

## 2014-12-15 MED ORDER — ALPRAZOLAM 1 MG PO TABS: 1.0000 mg | ORAL_TABLET | Freq: Three times a day (TID) | ORAL | Status: DC | PRN

## 2014-12-16 ENCOUNTER — Encounter: Payer: Self-pay | Admitting: Family Medicine

## 2014-12-16 MED ORDER — ALPRAZOLAM 1 MG PO TABS: 1.0000 mg | ORAL_TABLET | Freq: Three times a day (TID) | ORAL | Status: DC | PRN

## 2014-12-16 NOTE — Telephone Encounter (Signed)
Rx to be faxed to pharmacy before end of day

## 2014-12-21 ENCOUNTER — Other Ambulatory Visit: Payer: Self-pay | Admitting: Family Medicine

## 2014-12-24 ENCOUNTER — Other Ambulatory Visit: Payer: Self-pay | Admitting: *Deleted

## 2014-12-27 ENCOUNTER — Other Ambulatory Visit: Payer: Self-pay | Admitting: Family Medicine

## 2014-12-27 ENCOUNTER — Other Ambulatory Visit: Payer: Self-pay | Admitting: *Deleted

## 2014-12-27 NOTE — Telephone Encounter (Signed)
Last filled at pharmacy on 12/03/14, pt's last ov was with Dr Damita Dunnings on 12/02/14 for " psych needs", he has a f/u appt with Dr Deborra Medina on 01/03/15 to discuss meds. I denied rx, because it is 1 week too early.

## 2014-12-30 ENCOUNTER — Other Ambulatory Visit: Payer: Self-pay | Admitting: Family Medicine

## 2014-12-30 NOTE — Telephone Encounter (Signed)
Last OV 12/02/2014, Rx last filled 12/02/14

## 2014-12-31 MED ORDER — ZOLPIDEM TARTRATE 10 MG PO TABS: 10.0000 mg | ORAL_TABLET | Freq: Every day | ORAL | Status: DC

## 2014-12-31 NOTE — Telephone Encounter (Signed)
Medication was called into pharmacy

## 2014-12-31 NOTE — Telephone Encounter (Signed)
Please call in.  I would fill this in the meantime. He has f/u with PCP pending for 01/03/15.  Thanks.  Routed to PCP as FYI.

## 2015-01-03 ENCOUNTER — Ambulatory Visit (INDEPENDENT_AMBULATORY_CARE_PROVIDER_SITE_OTHER): Payer: BLUE CROSS/BLUE SHIELD | Admitting: Family Medicine

## 2015-01-03 ENCOUNTER — Encounter: Payer: Self-pay | Admitting: Family Medicine

## 2015-01-03 ENCOUNTER — Other Ambulatory Visit: Payer: Self-pay | Admitting: Family Medicine

## 2015-01-03 VITALS — BP 108/72 | HR 115 | Temp 90.0°F | Resp 18 | Wt 222.4 lb

## 2015-01-03 DIAGNOSIS — E785 Hyperlipidemia, unspecified: Secondary | ICD-10-CM | POA: Diagnosis not present

## 2015-01-03 DIAGNOSIS — I10 Essential (primary) hypertension: Secondary | ICD-10-CM

## 2015-01-03 DIAGNOSIS — F3170 Bipolar disorder, currently in remission, most recent episode unspecified: Secondary | ICD-10-CM

## 2015-01-03 LAB — CBC WITH DIFFERENTIAL/PLATELET
BASOS PCT: 0.4 % (ref 0.0–3.0)
Basophils Absolute: 0 10*3/uL (ref 0.0–0.1)
Eosinophils Absolute: 0.1 10*3/uL (ref 0.0–0.7)
Eosinophils Relative: 0.6 % (ref 0.0–5.0)
HCT: 48.2 % (ref 39.0–52.0)
HEMOGLOBIN: 16.3 g/dL (ref 13.0–17.0)
LYMPHS PCT: 20.7 % (ref 12.0–46.0)
Lymphs Abs: 2.6 10*3/uL (ref 0.7–4.0)
MCHC: 33.9 g/dL (ref 30.0–36.0)
MCV: 89.1 fl (ref 78.0–100.0)
MONOS PCT: 5.3 % (ref 3.0–12.0)
Monocytes Absolute: 0.7 10*3/uL (ref 0.1–1.0)
Neutro Abs: 9.1 10*3/uL — ABNORMAL HIGH (ref 1.4–7.7)
Neutrophils Relative %: 73 % (ref 43.0–77.0)
Platelets: 237 10*3/uL (ref 150.0–400.0)
RBC: 5.41 Mil/uL (ref 4.22–5.81)
RDW: 13.6 % (ref 11.5–15.5)
WBC: 12.5 10*3/uL — ABNORMAL HIGH (ref 4.0–10.5)

## 2015-01-03 LAB — COMPREHENSIVE METABOLIC PANEL
ALT: 23 U/L (ref 0–53)
AST: 19 U/L (ref 0–37)
Albumin: 4.4 g/dL (ref 3.5–5.2)
Alkaline Phosphatase: 90 U/L (ref 39–117)
BILIRUBIN TOTAL: 0.4 mg/dL (ref 0.2–1.2)
BUN: 15 mg/dL (ref 6–23)
CO2: 27 meq/L (ref 19–32)
CREATININE: 1.11 mg/dL (ref 0.40–1.50)
Calcium: 9.8 mg/dL (ref 8.4–10.5)
Chloride: 101 mEq/L (ref 96–112)
GFR: 75.49 mL/min (ref >60.00)
Glucose, Bld: 72 mg/dL (ref 70–99)
Potassium: 4.5 mEq/L (ref 3.5–5.1)
SODIUM: 134 meq/L — AB (ref 135–145)
TOTAL PROTEIN: 7.1 g/dL (ref 6.0–8.3)

## 2015-01-03 LAB — PSA: PSA: 0.93 ng/mL (ref 0.10–4.00)

## 2015-01-03 LAB — LIPID PANEL
Cholesterol: 239 mg/dL — ABNORMAL HIGH (ref 0–200)
HDL: 42.8 mg/dL (ref >39.00)
LDL CALC: 157 mg/dL — AB (ref 0–99)
NonHDL: 196.2
TRIGLYCERIDES: 198 mg/dL — AB (ref 0.0–149.0)
Total CHOL/HDL Ratio: 6
VLDL: 39.6 mg/dL (ref 0.0–40.0)

## 2015-01-03 MED ORDER — SIMVASTATIN 40 MG PO TABS: 40.0000 mg | ORAL_TABLET | Freq: Every day | ORAL | Status: DC

## 2015-01-03 MED ORDER — PANTOPRAZOLE SODIUM 40 MG PO TBEC: 40.0000 mg | DELAYED_RELEASE_TABLET | Freq: Two times a day (BID) | ORAL | Status: DC

## 2015-01-03 MED ORDER — ALBUTEROL SULFATE HFA 108 (90 BASE) MCG/ACT IN AERS: 1.0000 | INHALATION_SPRAY | Freq: Four times a day (QID) | RESPIRATORY_TRACT | Status: DC | PRN

## 2015-01-03 NOTE — Assessment & Plan Note (Signed)
>  25 minutes spent in face to face time with patient, >50% spent in counselling or coordination of care I am comfortable refilling his rxs until his psychiatric appointment but I did contract him for safety and he understands that should he decompensate- he needs to go to ER to seek immediate psychiatric attention. The patient indicates understanding of these issues and agrees with the plan.

## 2015-01-03 NOTE — Assessment & Plan Note (Signed)
Due for labs. eRx sent- has been out of rx for 2 weeks. Labs today.

## 2015-01-03 NOTE — Patient Instructions (Signed)
Great to see you. We will call you with your lab results and you can view them online.  

## 2015-01-03 NOTE — Progress Notes (Signed)
49 yo with h/o bipolar disorder,  HTN, HLD, low testosterone, whom I have not seen in over a year, here for "medication refill."  Bipolar disorder- has been seeing my partner, Dr. Massie Maroon, for rib fractures and anxiety. Last saw Dr. Damita Dunnings on 12/02/14.  Note reviewed- not pleased with current psychiatrist.  Made appt with new psychiatrist, Dr. Toy Care, but does not have an appt until 06/2015.  He and Dr. Damita Dunnings felt his mood has been "stable."   Has not had a manic or depression episode in over 8 years. Dr. Damita Dunnings refilled his abmien.  Still taking zoloft, seroquel and xanax.  HTN- on Lisinopril 20 mg daily. Lab Results  Component Value Date   CREATININE 0.91 12/16/2013   Denies any CP, SOB or LE edema.  No blurred vision.  HLD- takes zocor 40 mg daily.  Over due for labs.  Denies myalgias. Lab Results  Component Value Date   CHOL 184 01/07/2013   HDL 38.90* 01/07/2013   LDLCALC 129* 01/07/2013   LDLDIRECT 170.0 10/25/2010   TRIG 82.0 01/07/2013   CHOLHDL 5 01/07/2013    Current Outpatient Prescriptions on File Prior to Visit  Medication Sig Dispense Refill  . ALPRAZolam (XANAX) 1 MG tablet Take 1 tablet (1 mg total) by mouth 3 (three) times daily as needed for anxiety. 60 tablet 0  . diphenhydrAMINE (BENADRYL) 25 MG tablet Take 25 mg by mouth every 6 (six) hours as needed.    Marland Kitchen lisinopril (PRINIVIL,ZESTRIL) 20 MG tablet TAKE 1 TABLET BY MOUTH DAILY. 30 tablet 0  . oxyCODONE-acetaminophen (PERCOCET/ROXICET) 5-325 MG per tablet TAKE 1 TO 2 TABLETS BY MOUTH EVERY 8 HOURS AS NEEDED FOR PAIN. 30 tablet 0  . QUEtiapine (SEROQUEL) 300 MG tablet 300 mg. Take two tablets by mouth at bedtime    . sertraline (ZOLOFT) 100 MG tablet Take 100 mg by mouth daily.     . tamsulosin (FLOMAX) 0.4 MG CAPS capsule Take 0.4 mg by mouth daily after supper.    . Testosterone (ANDROGEL) 20.25 MG/1.25GM (1.62%) GEL Place 2 application onto the skin daily. 75 g 0  . zolpidem (AMBIEN) 10 MG tablet Take 1 tablet (10  mg total) by mouth at bedtime. 30 tablet 0   No current facility-administered medications on file prior to visit.    No Known Allergies  Past Medical History  Diagnosis Date  . Hyperlipidemia   . Hypertension   . GERD (gastroesophageal reflux disease)   . Bipolar disorder   . Sleep apnea     test 3/15-waiting results-was told oxygen did drop  . Anxiety     Past Surgical History  Procedure Laterality Date  . Septoplasty  1990  . Bronchoscopy    . Mass excision Left 12/17/2013    Procedure: EXCISION MASS LEFT WRIST;  Surgeon: Tennis Must, MD;  Location: [facility];  Service: Orthopedics;  Laterality: Left;    Family History  Problem Relation Age of Onset  . Cancer Mother     melanoma  . Depression Other   . Cancer Other     skin    History   Social History  . Marital Status: Married    Spouse Name: N/A  . Number of Children: N/A  . Years of Education: N/A   Occupational History  . Not on file.   Social History Main Topics  . Smoking status: Current Every Day Smoker -- 0.50 packs/day  . Smokeless tobacco: Not on file  . Alcohol Use: No  Comment: quit 2007  . Drug Use: No     Comment: history cocaine-last 2006  . Sexual Activity: Not on file   Other Topics Concern  . Not on file   Social History Narrative       The PMH, PSH, Social History, Family History, Medications, and allergies have been reviewed in [facility], and have been updated if relevant.   Review of Systems  Review of Systems  Constitutional: Negative.   HENT: Negative.   Respiratory: Negative.   Cardiovascular: Negative.   Gastrointestinal: Negative.   Endocrine: Negative.   Genitourinary: Negative.   Musculoskeletal: Negative.   Skin: Negative.   Allergic/Immunologic: Negative.   Neurological: Negative.   Hematological: Negative.   Psychiatric/Behavioral: Negative.   All other systems reviewed and are negative.     Physical Exam BP 108/72 mmHg  Pulse 115   Temp(Src) 90 F (32.2 C) (Oral)  Resp 18  Wt 222 lb 6.4 oz (100.88 kg)  SpO2 96%  General:  alert, well-developed, well-nourished, and well-hydrated.   Head:  normocephalic and atraumatic.   Eyes:  vision grossly intact, pupils equal, pupils round, and pupils reactive to light.   Ears:  R ear normal and L ear normal.   Nose:  no external deformity.   Mouth:  good dentition.   Neck:  No deformities, masses, or tenderness noted. Lungs:  Normal respiratory effort, chest expands symmetrically. Lungs are clear to auscultation, no crackles or wheezes. Heart:  Normal rate and regular rhythm. S1 and S2 normal without gallop, murmur, click, rub or other extra sounds. Psych:  Cognition and judgment appear intact. Alert and cooperative with normal attention span and concentration. No apparent delusions, illusions, hallucinations

## 2015-01-04 ENCOUNTER — Encounter: Payer: Self-pay | Admitting: *Deleted

## 2015-01-12 ENCOUNTER — Other Ambulatory Visit: Payer: Self-pay | Admitting: *Deleted

## 2015-01-12 ENCOUNTER — Other Ambulatory Visit: Payer: Self-pay | Admitting: Family Medicine

## 2015-01-12 NOTE — Telephone Encounter (Signed)
Last refilled #60 on 12/16/14.  Patient is requesting #90.  Okay to refill?

## 2015-01-13 MED ORDER — ALPRAZOLAM 1 MG PO TABS: 1.0000 mg | ORAL_TABLET | Freq: Three times a day (TID) | ORAL | Status: DC | PRN

## 2015-01-13 NOTE — Telephone Encounter (Signed)
Pt left v/m requesting status of alprazolam refill to [facility].Medication phoned to [place]. pharmacy as instructed. Pt's wife notified done.

## 2015-01-27 ENCOUNTER — Other Ambulatory Visit: Payer: Self-pay | Admitting: *Deleted

## 2015-01-27 NOTE — Telephone Encounter (Signed)
Last f/u appt 12/2014 

## 2015-01-28 MED ORDER — ZOLPIDEM TARTRATE 10 MG PO TABS: 10.0000 mg | ORAL_TABLET | Freq: Every day | ORAL | Status: DC

## 2015-01-28 NOTE — Telephone Encounter (Signed)
Rx called in to requested pharmacy 

## 2015-02-08 ENCOUNTER — Other Ambulatory Visit: Payer: Self-pay | Admitting: *Deleted

## 2015-02-08 MED ORDER — ALPRAZOLAM 1 MG PO TABS: 1.0000 mg | ORAL_TABLET | Freq: Three times a day (TID) | ORAL | Status: DC | PRN

## 2015-02-08 NOTE — Telephone Encounter (Signed)
Last f/u appt 12/2014

## 2015-02-08 NOTE — Telephone Encounter (Signed)
Rx called in to requested pharmacy 

## 2015-03-01 ENCOUNTER — Other Ambulatory Visit: Payer: Self-pay | Admitting: *Deleted

## 2015-03-01 MED ORDER — ZOLPIDEM TARTRATE 10 MG PO TABS: 10.0000 mg | ORAL_TABLET | Freq: Every day | ORAL | Status: DC

## 2015-03-01 NOTE — Telephone Encounter (Signed)
Last f/u appt 12/2014 

## 2015-03-01 NOTE — Telephone Encounter (Signed)
Rx called in to requested pharmacy 

## 2015-03-07 ENCOUNTER — Other Ambulatory Visit: Payer: Self-pay | Admitting: Family Medicine

## 2015-03-09 NOTE — Telephone Encounter (Signed)
Patient's wife called to ask to be called when patient's rx for Xanax. They're going out of town on Friday. Please call patient's wife at [phone].

## 2015-03-10 NOTE — Telephone Encounter (Signed)
[name] said alprazolam had not been called in to pharmacy; spoke with [facility] and they did not have alprazolam refill.Medication phoned to [facility] pharmacy as instructed. [name] notified done and apologized for delay.

## 2015-03-17 ENCOUNTER — Other Ambulatory Visit: Payer: Self-pay | Admitting: Family Medicine

## 2015-03-23 ENCOUNTER — Other Ambulatory Visit: Payer: Self-pay | Admitting: Family Medicine

## 2015-03-23 NOTE — Telephone Encounter (Signed)
Last f/u appt 12/2014 

## 2015-03-29 ENCOUNTER — Other Ambulatory Visit: Payer: Self-pay | Admitting: Family Medicine

## 2015-03-29 NOTE — Telephone Encounter (Signed)
Last f/u appt 12/2014 

## 2015-03-29 NOTE — Telephone Encounter (Signed)
plz phone in. 

## 2015-03-29 NOTE — Telephone Encounter (Signed)
Mrs [facility] request cb with reason androgel cannot be refilled. (DPR signed)

## 2015-03-29 NOTE — Telephone Encounter (Signed)
Dr. Deborra Medina is out of the office this week.  Please address.

## 2015-03-30 NOTE — Telephone Encounter (Signed)
Rx called in as directed.   

## 2015-03-30 NOTE — Telephone Encounter (Signed)
Spoke to pts wife and informed her pts last Rx for med was 12/2013. If needing to restart, he will need OV and possible labs. States she will speak with pt about schedule, before making appt

## 2015-04-04 ENCOUNTER — Other Ambulatory Visit: Payer: Self-pay | Admitting: Family Medicine

## 2015-04-04 NOTE — Telephone Encounter (Signed)
Rx called in as directed.   

## 2015-04-04 NOTE — Telephone Encounter (Signed)
Last office visit 01/03/15 - Acute. Last refilled 03/09/15 #90.  Is it okay to refill?

## 2015-04-19 ENCOUNTER — Other Ambulatory Visit: Payer: Self-pay | Admitting: Family Medicine

## 2015-04-22 ENCOUNTER — Encounter (HOSPITAL_COMMUNITY): Payer: Self-pay | Admitting: Emergency Medicine

## 2015-04-22 ENCOUNTER — Emergency Department (HOSPITAL_COMMUNITY)
Admission: EM | Admit: 2015-04-22 | Discharge: 2015-04-22 | Disposition: A | Discharge status: Home / Self Care | Payer: BLUE CROSS/BLUE SHIELD | Attending: Emergency Medicine | Admitting: Emergency Medicine

## 2015-04-22 DIAGNOSIS — I10 Essential (primary) hypertension: Secondary | ICD-10-CM | POA: Diagnosis not present

## 2015-04-22 DIAGNOSIS — K644 Residual hemorrhoidal skin tags: Secondary | ICD-10-CM | POA: Diagnosis not present

## 2015-04-22 DIAGNOSIS — F319 Bipolar disorder, unspecified: Secondary | ICD-10-CM | POA: Diagnosis not present

## 2015-04-22 DIAGNOSIS — M545 Low back pain, unspecified: Secondary | ICD-10-CM

## 2015-04-22 DIAGNOSIS — F419 Anxiety disorder, unspecified: Secondary | ICD-10-CM | POA: Diagnosis not present

## 2015-04-22 DIAGNOSIS — E785 Hyperlipidemia, unspecified: Secondary | ICD-10-CM | POA: Diagnosis not present

## 2015-04-22 DIAGNOSIS — K219 Gastro-esophageal reflux disease without esophagitis: Secondary | ICD-10-CM | POA: Insufficient documentation

## 2015-04-22 DIAGNOSIS — Z720 Tobacco use: Secondary | ICD-10-CM | POA: Diagnosis not present

## 2015-04-22 DIAGNOSIS — Z79899 Other long term (current) drug therapy: Secondary | ICD-10-CM | POA: Diagnosis not present

## 2015-04-22 MED ORDER — OXYCODONE-ACETAMINOPHEN 5-325 MG PO TABS
2.0000 | ORAL_TABLET | Freq: Once | ORAL | Status: AC
  Administered 2015-04-22: 2 via ORAL
  Filled 2015-04-22: qty 2

## 2015-04-22 MED ORDER — POLYETHYLENE GLYCOL 3350 17 GM/SCOOP PO POWD: 17.0000 g | Freq: Once | ORAL | Status: DC

## 2015-04-22 MED ORDER — CYCLOBENZAPRINE HCL 10 MG PO TABS
10.0000 mg | ORAL_TABLET | Freq: Once | ORAL | Status: AC
  Administered 2015-04-22: 10 mg via ORAL
  Filled 2015-04-22: qty 1

## 2015-04-22 MED ORDER — CYCLOBENZAPRINE HCL 10 MG PO TABS: 10.0000 mg | ORAL_TABLET | Freq: Two times a day (BID) | ORAL | Status: DC | PRN

## 2015-04-22 MED ORDER — OXYCODONE-ACETAMINOPHEN 5-325 MG PO TABS: 1.0000 | ORAL_TABLET | ORAL | Status: DC | PRN

## 2015-04-22 NOTE — ED Notes (Signed)
Pt reports trying to catch a transmission that was falling and is having back and left hip pain since yesterday. Pt also felt a knot at his anus after the incident that has "gone down some after preporation H". Pt ambulatory. Denies numbness or tingling to either leg. Has bowel and bladder control.

## 2015-04-22 NOTE — Discharge Instructions (Signed)
Chronic Back Pain  When back pain lasts longer than 3 months, it is called chronic back pain.People with chronic back pain often go through certain periods that are more intense (flare-ups).  CAUSES Chronic back pain can be caused by wear and tear (degeneration) on different structures in your back. These structures include:  The bones of your spine (vertebrae) and the joints surrounding your spinal cord and nerve roots (facets).  The strong, fibrous tissues that connect your vertebrae (ligaments). Degeneration of these structures may result in pressure on your nerves. This can lead to constant pain. HOME CARE INSTRUCTIONS  Avoid bending, heavy lifting, prolonged sitting, and activities which make the problem worse.  Take brief periods of rest throughout the day to reduce your pain. Lying down or standing usually is better than sitting while you are resting.  Take over-the-counter or prescription medicines only as directed by your caregiver. SEEK IMMEDIATE MEDICAL CARE IF:   You have weakness or numbness in one of your legs or feet.  You have trouble controlling your bladder or bowels.  You have nausea, vomiting, abdominal pain, shortness of breath, or fainting. Document Released: 10/18/2004 Document Revised: 12/03/2011 Document Reviewed: 08/25/2011 [facility] Patient Information 2015 [place], [place]. This information is not intended to replace advice given to you by your health care provider. Make sure you discuss any questions you have with your health care provider.  Back Pain, Adult Back pain is very common. The pain often gets better over time. The cause of back pain is usually not dangerous. Most people can learn to manage their back pain on their own.  HOME CARE   Stay active. Start with short walks on flat ground if you can. Try to walk farther each day.  Do not sit, drive, or stand in one place for more than 30 minutes. Do not stay in bed.  Do not avoid exercise or work.  Activity can help your back heal faster.  Be careful when you bend or lift an object. Bend at your knees, keep the object close to you, and do not twist.  Sleep on a firm mattress. Lie on your side, and bend your knees. If you lie on your back, put a pillow under your knees.  Only take medicines as told by your doctor.  Put ice on the injured area.  Put ice in a plastic bag.  Place a towel between your skin and the bag.  Leave the ice on for 15-20 minutes, 03-04 times a day for the first 2 to 3 days. After that, you can switch between ice and heat packs.  Ask your doctor about back exercises or massage.  Avoid feeling anxious or stressed. Find good ways to deal with stress, such as exercise. GET HELP RIGHT AWAY IF:   Your pain does not go away with rest or medicine.  Your pain does not go away in 1 week.  You have new problems.  You do not feel well.  The pain spreads into your legs.  You cannot control when you poop (bowel movement) or pee (urinate).  Your arms or legs feel weak or lose feeling (numbness).  You feel sick to your stomach (nauseous) or throw up (vomit).  You have belly (abdominal) pain.  You feel like you may pass out (faint). MAKE SURE YOU:   Understand these instructions.  Will watch your condition.  Will get help right away if you are not doing well or get worse. Document Released: 02/27/2008 Document Revised: 12/03/2011 Document  Reviewed: 01/12/2014 [employer] Patient Information 2015 [place], [place]. This information is not intended to replace advice given to you by your health care provider. Make sure you discuss any questions you have with your health care provider.  Hemorrhoids Hemorrhoids are swollen veins around the rectum or anus. There are two types of hemorrhoids:   Internal hemorrhoids. These occur in the veins just inside the rectum. They may poke through to the outside and become irritated and painful.  External hemorrhoids. These  occur in the veins outside the anus and can be felt as a painful swelling or hard lump near the anus. CAUSES  Pregnancy.   Obesity.   Constipation or diarrhea.   Straining to have a bowel movement.   Sitting for long periods on the toilet.  Heavy lifting or other activity that caused you to strain.  Anal intercourse. SYMPTOMS   Pain.   Anal itching or irritation.   Rectal bleeding.   Fecal leakage.   Anal swelling.   One or more lumps around the anus.  DIAGNOSIS  Your caregiver may be able to diagnose hemorrhoids by visual examination. Other examinations or tests that may be performed include:   Examination of the rectal area with a gloved hand (digital rectal exam).   Examination of anal canal using a small tube (scope).   A blood test if you have lost a significant amount of blood.  A test to look inside the colon (sigmoidoscopy or colonoscopy). TREATMENT Most hemorrhoids can be treated at home. However, if symptoms do not seem to be getting better or if you have a lot of rectal bleeding, your caregiver may perform a procedure to help make the hemorrhoids get smaller or remove them completely. Possible treatments include:   Placing a rubber band at the base of the hemorrhoid to cut off the circulation (rubber band ligation).   Injecting a chemical to shrink the hemorrhoid (sclerotherapy).   Using a tool to burn the hemorrhoid (infrared light therapy).   Surgically removing the hemorrhoid (hemorrhoidectomy).   Stapling the hemorrhoid to block blood flow to the tissue (hemorrhoid stapling).  HOME CARE INSTRUCTIONS   Eat foods with fiber, such as whole grains, beans, nuts, fruits, and vegetables. Ask your doctor about taking products with added fiber in them (fibersupplements).  Increase fluid intake. Drink enough water and fluids to keep your urine clear or pale yellow.   Exercise regularly.   Go to the bathroom when you have the urge to  have a bowel movement. Do not wait.   Avoid straining to have bowel movements.   Keep the anal area dry and clean. Use wet toilet paper or moist towelettes after a bowel movement.   Medicated creams and suppositories may be used or applied as directed.   Only take over-the-counter or prescription medicines as directed by your caregiver.   Take warm sitz baths for 15-20 minutes, 3-4 times a day to ease pain and discomfort.   Place ice packs on the hemorrhoids if they are tender and swollen. Using ice packs between sitz baths may be helpful.   Put ice in a plastic bag.   Place a towel between your skin and the bag.   Leave the ice on for 15-20 minutes, 3-4 times a day.   Do not use a donut-shaped pillow or sit on the toilet for long periods. This increases blood pooling and pain.  SEEK MEDICAL CARE IF:  You have increasing pain and swelling that is not controlled by treatment  or medicine.  You have uncontrolled bleeding.  You have difficulty or you are unable to have a bowel movement.  You have pain or inflammation outside the area of the hemorrhoids. MAKE SURE YOU:  Understand these instructions.  Will watch your condition.  Will get help right away if you are not doing well or get worse. Document Released: 09/07/2000 Document Revised: 08/27/2012 Document Reviewed: 07/15/2012 [facility] Patient Information 2015 [place], [place]. This information is not intended to replace advice given to you by your health care provider. Make sure you discuss any questions you have with your health care provider.

## 2015-04-22 NOTE — ED Provider Notes (Signed)
CSN: [id number]     Arrival date & time 04/22/15  1755 History   First MD Initiated Contact with Patient 04/22/15 1824     Chief Complaint  Patient presents with  . Back Pain     (Consider location/radiation/quality/duration/timing/severity/associated sxs/prior Treatment) HPI     Patient is a 49 year old male with history of hypertension, hyperlipidemia, GERD and chronic back pain, presents to the ER with 1 day of increasing back pain after attempting to catch a transmission.  He has pain in both of his trapezius from his shoulder to his neck, also increasing low left back pain with radiation into his left buttocks, rated 8 out of 10, which is associated with muscle tightness and intermittent spasms.  The onset of back pain coincided with new onset of large hemorrhoids, which were painful, without any blood with bowel movements.  They have decreased in size and decreasing pain with rest today and with application of Preparation H.  Patient denies any urinary or bowel incontinence. Denies any numbness, tingling, weakness, fever, chills, nausea, vomiting, diarrhea.  Denies illegal drug use or personal CA hx.   Past Medical History  Diagnosis Date  . Hyperlipidemia   . Hypertension   . GERD (gastroesophageal reflux disease)   . Bipolar disorder   . Sleep apnea     test 3/15-waiting results-was told oxygen did drop  . Anxiety    Past Surgical History  Procedure Laterality Date  . Septoplasty  1990  . Bronchoscopy    . Mass excision Left 12/17/2013    Procedure: EXCISION MASS LEFT WRIST;  Surgeon: Tennis Must, MD;  Location: [facility];  Service: Orthopedics;  Laterality: Left;   Family History  Problem Relation Age of Onset  . Cancer Mother     melanoma  . Depression Other   . Cancer Other     skin   History  Substance Use Topics  . Smoking status: Current Every Day Smoker -- 0.50 packs/day  . Smokeless tobacco: Not on file  . Alcohol Use: No     Comment:  quit 2007    Review of Systems  10 Systems reviewed and are negative for acute change except as noted in the HPI.      Allergies  Review of patient's allergies indicates no known allergies.  Home Medications   Prior to Admission medications   Medication Sig Start Date End Date Taking? Authorizing Provider  albuterol (PROAIR HFA) 108 (90 BASE) MCG/ACT inhaler Inhale 1-2 puffs into the lungs every 6 (six) hours as needed for wheezing or shortness of breath. 01/03/15  Yes Lucille Passy, MD  ALPRAZolam (XANAX) 1 MG tablet TAKE 1 TABLET BY MOUTH 3 TIMES A DAY AS NEEDED FOR ANXIETY. 04/04/15  Yes Lucille Passy, MD  diphenhydrAMINE (BENADRYL) 25 MG tablet Take 25 mg by mouth at bedtime as needed for allergies.    Yes Historical Provider, MD  lisinopril (PRINIVIL,ZESTRIL) 20 MG tablet TAKE 1 TABLET BY MOUTH DAILY. 04/19/15  Yes Lucille Passy, MD  oxyCODONE-acetaminophen (PERCOCET/ROXICET) 5-325 MG per tablet TAKE 1 TO 2 TABLETS BY MOUTH EVERY 8 HOURS AS NEEDED FOR PAIN. 11/04/14  Yes Tonia Ghent, MD  pantoprazole (PROTONIX) 40 MG tablet Take 1 tablet (40 mg total) by mouth 2 (two) times daily. 01/03/15  Yes Lucille Passy, MD  QUEtiapine (SEROQUEL) 300 MG tablet 300 mg. Take two tablets by mouth at bedtime   Yes Historical Provider, MD  sertraline (ZOLOFT) 100 MG tablet  Take 100 mg by mouth daily.    Yes Historical Provider, MD  simvastatin (ZOCOR) 40 MG tablet Take 1 tablet (40 mg total) by mouth daily at 6 PM. Patient taking differently: Take 40 mg by mouth every morning.  01/03/15  Yes Lucille Passy, MD  tamsulosin (FLOMAX) 0.4 MG CAPS capsule Take 0.4 mg by mouth daily after supper.   Yes Historical Provider, MD  zolpidem (AMBIEN) 10 MG tablet TAKE 1 TABLET BY MOUTH AT BEDTIME 03/29/15  Yes Ria Bush, MD  Testosterone (ANDROGEL) 20.25 MG/1.25GM (1.62%) GEL Place 2 application onto the skin daily. Patient not taking: Reported on 04/22/2015 01/12/14   Lucille Passy, MD   BP 121/84 mmHg  Pulse 91   Temp(Src) 98.1 F (36.7 C) (Oral)  Resp 16  SpO2 100% Physical Exam  Constitutional: He is oriented to person, place, and time. He appears well-developed and well-nourished. No distress.  HENT:  Head: Normocephalic and atraumatic.  Nose: Nose normal.  Mouth/Throat: Oropharynx is clear and moist. No oropharyngeal exudate.  Eyes: Conjunctivae and EOM are normal. Pupils are equal, round, and reactive to light. Right eye exhibits no discharge. Left eye exhibits no discharge. No scleral icterus.  Neck: Normal range of motion. No JVD present. No tracheal deviation present. No thyromegaly present.  Cardiovascular: Normal rate, regular rhythm, normal heart sounds and intact distal pulses.  Exam reveals no gallop and no friction rub.   No murmur heard. Pulmonary/Chest: Effort normal and breath sounds normal. No respiratory distress. He has no wheezes. He has no rales. He exhibits no tenderness.  Abdominal: Soft. Bowel sounds are normal. He exhibits no distension and no mass. There is no tenderness. There is no rebound and no guarding.  Genitourinary: Rectal exam shows external hemorrhoid and tenderness. Rectal exam shows no internal hemorrhoid, no fissure, no mass and anal tone normal. Prostate is not enlarged and not tender.  Musculoskeletal: Normal range of motion. He exhibits no edema or tenderness.  Normal range of motion of bilateral shoulders, flexion and extension of back and normal flexion-extension of bilateral hips No spinal tenderness to palpation from cervical to lumbar spine Tender to palpation of left lumbar paraspinal muscles and left SI joint, no tenderness to palpation over IT band Negative straight leg raise, normal patellar and Achilles tendon reflex  Lymphadenopathy:    He has no cervical adenopathy.  Neurological: He is alert and oriented to person, place, and time. He has normal reflexes. No cranial nerve deficit. He exhibits normal muscle tone. Coordination normal.  Normal  sensation to light touch and sharp dull and bilateral lower extremities  Skin: Skin is warm and dry. No rash noted. He is not diaphoretic. No erythema. No pallor.  Psychiatric: He has a normal mood and affect. His behavior is normal. Judgment and thought content normal.  Nursing note and vitals reviewed.   ED Course  Procedures (including critical care time) Labs Review Labs Reviewed - No data to display  Imaging Review No results found.   EKG Interpretation None      MDM   Final diagnoses:  None    Patient with acute on chronic back pain.  No neurological deficits and normal neuro exam.  Patient can walk but states is painful.  No loss of bowel or bladder control.  No concern for cauda equina.  No fever, night sweats, weight loss, h/o cancer, IVDU.  RICE protocol and pain medicine indicated and discussed with patient.   Pt given oral pain meds and  muscle relaxer.  Rx for the same with miralax to soften stool and OTC tx of hemoroids.   Pt will f/u with his PCP on Monday.  Return precautions given and verbally acknowledged.  D/C home with ride from family member.  Able to ambulate without difficulty.   Delsa Grana, PA-C 05/03/15 [place], MD 05/13/15 [phone]

## 2015-04-25 ENCOUNTER — Telehealth: Payer: Self-pay

## 2015-04-25 NOTE — Telephone Encounter (Signed)
PLEASE NOTE: All timestamps contained within this report are represented as [place] Standard Time. CONFIDENTIALTY NOTICE: This fax transmission is intended only for the addressee. It contains information that is legally privileged, confidential or otherwise protected from use or disclosure. If you are not the intended recipient, you are strictly prohibited from reviewing, disclosing, copying using or disseminating any of this information or taking any action in reliance on or regarding this information. If you have received this fax in error, please notify us immediately by telephone so that we can arrange for its return to [place]. Phone: [phone], Toll-Free: [phone], Fax: [phone] Page: 1 of 2 Call Id: [id number] [place] Patient Name: [name] Gender: Male DOB: 1966/04/03 Age: 49 Y 2 M 25 D Return Phone Number: [phone] (Primary), [phone] (Secondary) Address: [place] [address] Client  [facility] - Client Client Site [place] Physician Arnette Norris Contact Type Call Call Type Triage / Clinical Relationship To Patient Self Return Phone Number [phone] (Primary) Chief Complaint Pain - Generalized Initial Comment Caller states he has injured his back while working on a car and and also has a hemorrhoid and is in pain. PreDisposition Did not know what to do Nurse Assessment Nurse: Wayne Sever, RN, Tillie Rung Date/Time (Eastern Time): 04/22/2015 5:23:17 PM Confirm and document reason for call. If symptomatic, describe symptoms. ---Caller states, "I pulled my back while working on a transmission and I blew something out of my anus." "It's is the size of my thumb and throbbing." "My back is killing me and my butt is rubbed raw." "I am trying to get by until I see them Monday." Has the patient traveled  out of the country within the last 30 days? ---Not Applicable Does the patient require triage? ---Yes Related visit to physician within the last 2 weeks? ---No Does the PT have any chronic conditions? (i.e. diabetes, asthma, etc.) ---Yes List chronic conditions. ---HTN, High Cholesterol Guidelines Guideline Title Affirmed Question Affirmed Notes Nurse Date/Time (Eastern Time) Rectal Symptoms SEVERE rectal pain (e.g., excruciating, unable to have a bowel movement) Wayne Sever, RN, Tillie Rung 04/22/2015 5:25:31 PM Disp. Time Eilene Ghazi Time) Disposition Final User 04/22/2015 5:26:48 PM See Physician within 4 Hours (or PCP triage) Yes Wayne Sever, RN, Mable Paris Understands: Yes Disagree/Comply: Comply PLEASE NOTE: All timestamps contained within this report are represented as [place] Standard Time. CONFIDENTIALTY NOTICE: This fax transmission is intended only for the addressee. It contains information that is legally privileged, confidential or otherwise protected from use or disclosure. If you are not the intended recipient, you are strictly prohibited from reviewing, disclosing, copying using or disseminating any of this information or taking any action in reliance on or regarding this information. If you have received this fax in error, please notify us immediately by telephone so that we can arrange for its return to [place]. Phone: [phone], Toll-Free: [phone], Fax: [phone] Page: 2 of 2 Call Id: [id number] Care Advice Given Per Guideline SEE PHYSICIAN WITHIN 4 HOURS (or PCP triage): * IF NO PCP TRIAGE: You need to be seen. Go to _______________ (ED/ [facility] or office if it will be open) within the next 3 or 4 hours. Go sooner if you become worse. CARE ADVICE given per Rectal Symptoms (Adult) guideline. * You become worse. CALL BACK IF: * Take 400 mg (two 200 mg pills) by mouth every 6 hours as needed. IBUPROFEN (E.G., MOTRIN, ADVIL): After Care  Instructions Given Call Event Type User Date /  Time Description Comments User: Franco Nones, RN Date/Time (Eastern Time): 04/22/2015 5:28:11 PM Caller was wanting something for pain and was not sure about going to get seen this weekend or not. Informed him he would need to get evaluated to get pain medication. He then states, "Well, you have been to much help." and hung up Referrals GO TO FACILITY UNDECIDED

## 2015-04-25 NOTE — Telephone Encounter (Signed)
Pt has appt scheduled 05/02/15 at 8 am with Dr Deborra Medina.

## 2015-04-25 NOTE — Telephone Encounter (Signed)
PLEASE NOTE: All timestamps contained within this report are represented as [place] Standard Time. CONFIDENTIALTY NOTICE: This fax transmission is intended only for the addressee. It contains information that is legally privileged, confidential or otherwise protected from use or disclosure. If you are not the intended recipient, you are strictly prohibited from reviewing, disclosing, copying using or disseminating any of this information or taking any action in reliance on or regarding this information. If you have received this fax in error, please notify us immediately by telephone so that we can arrange for its return to [place]. Phone: [phone], Toll-Free: [phone], Fax: [phone] Page: 1 of 1 Call Id: [id number] [place] Patient Name: [name] Gender: Male DOB: 25-Aug-1966 Age: 49 Y 2 M 25 D Return Phone Number: [phone] (Primary) Address: City/State/Zip: [place] [address] Client [facility] - Client Client Site [place] Physician Arnette Norris Contact Type Call Call Type Triage / Clinical Relationship To Patient Spouse Return Phone Number [phone] (Primary) Chief Complaint Back Injury Initial Comment Caller states husband hurt his back and has a hemorrhoids; He would like to get something for pain for both of the symptoms. Dr is [facility] on [address]. Nurse Assessment Nurse: Einar Gip, RN, Neoma Laming Date/Time (Eastern Time): 04/22/2015 5:08:37 PM Confirm and document reason for call. If symptomatic, describe symptoms. ---Caller is not with the patient. Advised I can only answer a general question without his permission. Caller verbalized understanding and does not have a general question. Advised to have patient call for triage. Has the patient traveled out of the country within the last  30 days? ---Not Applicable Does the patient require triage? ---No Guidelines Guideline Title Affirmed Question Affirmed Notes Nurse Date/Time (Eastern Time) Disp. Time Eilene Ghazi Time) Disposition Final User 04/22/2015 5:10:32 PM Clinical Call Yes Einar Gip, RN, Neoma Laming After Care Instructions Given Call Event Type User Date / Time Description

## 2015-04-28 ENCOUNTER — Ambulatory Visit: Admission: RE | Admit: 2015-04-28 | Payer: BLUE CROSS/BLUE SHIELD | Source: Ambulatory Visit

## 2015-04-28 ENCOUNTER — Encounter: Payer: Self-pay | Admitting: Primary Care

## 2015-04-28 ENCOUNTER — Ambulatory Visit (INDEPENDENT_AMBULATORY_CARE_PROVIDER_SITE_OTHER): Payer: BLUE CROSS/BLUE SHIELD | Admitting: Primary Care

## 2015-04-28 VITALS — BP 122/72 | HR 111 | Temp 97.8°F | Wt 216.8 lb

## 2015-04-28 DIAGNOSIS — K649 Unspecified hemorrhoids: Secondary | ICD-10-CM | POA: Diagnosis not present

## 2015-04-28 DIAGNOSIS — M5489 Other dorsalgia: Secondary | ICD-10-CM

## 2015-04-28 DIAGNOSIS — M549 Dorsalgia, unspecified: Secondary | ICD-10-CM | POA: Insufficient documentation

## 2015-04-28 MED ORDER — OXYCODONE-ACETAMINOPHEN 5-325 MG PO TABS: 1.0000 | ORAL_TABLET | Freq: Four times a day (QID) | ORAL | Status: DC | PRN

## 2015-04-28 NOTE — Telephone Encounter (Signed)
He was given 20 tabs which should be enough until he is seen.

## 2015-04-28 NOTE — Patient Instructions (Signed)
You have been provided with enough pain medication to last until your appointment with Dr. Deborra Medina on Monday.  Complete xray(s) prior to leaving today. I will contact you regarding your results.  Please follow up with PCP as scheduled on Monday.

## 2015-04-28 NOTE — Telephone Encounter (Signed)
Spoke to pt and advised per Dr Deborra Medina. Pt is upset that he is unable to receive any additional tabs and is going to consider switching PCP. Pt then states "youre probably the black girl that always tells me no." I advised him that i am Dr Hulen Shouts assistant, so yes, he has spoken with me numerous times, and his reply was "yes, THAT black girl!" Advised Dr Deborra Medina of his frustration and call transferred to office manager.

## 2015-04-28 NOTE — Telephone Encounter (Signed)
Patient went to [place] and they prescribed Percocet, but he ran out of meds yesterday.  He would like to know if Dr. Deborra Medina would write him another prescription until he sees her on Monday 05/02/15.

## 2015-04-28 NOTE — Assessment & Plan Note (Signed)
Chronic since teenage years. Acute on chronic episode since lifting a heavy carburetor last Friday. Evaluated in ED, provided with scripts for percocet, flexeril.  He had no imaging in the ED. Ordered xray of thoracic and lumbar spine today, he left before obtaining xrays. Refill also provided of percocet to last until re-evaluated by PCP on Monday. I told him that treating his chronic pain with narcotic was not the best way to obtain improvement. He voiced understanding. Discussed importance of proper body mechanics with heavy lifting and to avoid heavy lifting if possible given his history.  He also declined physical therapy.

## 2015-04-28 NOTE — Assessment & Plan Note (Signed)
Endorses history of. 1 cm non thrombosed external hemorrhoid noted to anus. Continue preparation H.

## 2015-04-28 NOTE — Progress Notes (Signed)
Pre visit review using our clinic review tool, if applicable. No additional management support is needed unless otherwise documented below in the visit note. 

## 2015-04-28 NOTE — Progress Notes (Signed)
Subjective:    Patient ID: [name], male    DOB: 02-Jun-1966, 49 y.o.   MRN: [id number]  HPI  [name] is a 49 year old male who presents today for emergency department follow up. He presented to [facility] emergency department on 04/22/15 and was evaluated for back pain and new onset of hemorrhoids.   He was determined to have acute on chronic back pain and external hemorrhoids without ulceration. He was provided with a script for percocet 5/325 tablets with a quantity of 20 (summing to a 3 day supply) and flexeril which he takes at night. 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 He's been taking the percocet and has run out. He repots that this is the only medication that can help with his pain. He is also using preporation H for the hemorrhoids with improvement and was also instructed to start mirilax.   Since discharge:  1) Back pain: Overall feeling better but continues to suffer from chronic daily pain. His pain is mostly located from T3 to L5. Denies radiculopathy. He's not recently had PT and declines. Patient also has not had follow up imaging to his back in over 20 years. He will often lift heavy objects at work and endorses good body mechanics.  2) Hemorrhoids: History of and will typically use preparation H. Overall feeling better than Friday and is continuing to use preporation H with relief. He has noticed a significant reduction in his hemorrhoid.  Review of Systems  Constitutional: Negative for fever and chills.  Gastrointestinal:       Rectal discomfort due to current hemorrhoid  Musculoskeletal: Positive for back pain. Negative for joint swelling.       Past Medical History  Diagnosis Date  . Hyperlipidemia   . Hypertension   . GERD (gastroesophageal reflux disease)   . Bipolar disorder   . Sleep apnea     test 3/15-waiting results-was told oxygen did drop  . Anxiety     History   Social History  . Marital  Status: Married    Spouse Name: N/A  . Number of Children: N/A  . Years of Education: N/A   Occupational History  . Not on file.   Social History Main Topics  . Smoking status: Current Every Day Smoker -- 0.50 packs/day  . Smokeless tobacco: Not on file  . Alcohol Use: No     Comment: quit 2007  . Drug Use: No     Comment: history cocaine-last 2006  . Sexual Activity: Not on file   Other Topics Concern  . Not on file   Social History Narrative    Past Surgical History  Procedure Laterality Date  . Septoplasty  1990  . Bronchoscopy    . Mass excision Left 12/17/2013    Procedure: EXCISION MASS LEFT WRIST;  Surgeon: Tennis Must, MD;  Location: [facility];  Service: Orthopedics;  Laterality: Left;    Family History  Problem Relation Age of Onset  . Cancer Mother     melanoma  . Depression Other   . Cancer Other     skin    No Known Allergies  Current Outpatient Prescriptions on File Prior to Visit  Medication Sig Dispense Refill  . albuterol (PROAIR HFA) 108 (90 BASE) MCG/ACT inhaler Inhale 1-2 puffs into the lungs every 6 (six) hours as needed for wheezing or shortness of breath. 1 Inhaler 1  . ALPRAZolam (  XANAX) 1 MG tablet TAKE 1 TABLET BY MOUTH 3 TIMES A DAY AS NEEDED FOR ANXIETY. 90 tablet 0  . cyclobenzaprine (FLEXERIL) 10 MG tablet Take 1 tablet (10 mg total) by mouth 2 (two) times daily as needed for muscle spasms. 20 tablet 0  . diphenhydrAMINE (BENADRYL) 25 MG tablet Take 25 mg by mouth at bedtime as needed for allergies.     Marland Kitchen lisinopril (PRINIVIL,ZESTRIL) 20 MG tablet TAKE 1 TABLET BY MOUTH DAILY. 30 tablet 0  . pantoprazole (PROTONIX) 40 MG tablet Take 1 tablet (40 mg total) by mouth 2 (two) times daily. 60 tablet 6  . polyethylene glycol powder (GLYCOLAX/MIRALAX) powder Take 17 g by mouth once. 255 g 0  . QUEtiapine (SEROQUEL) 300 MG tablet 300 mg. Take two tablets by mouth at bedtime    . sertraline (ZOLOFT) 100 MG tablet Take 100 mg by  mouth daily.     . simvastatin (ZOCOR) 40 MG tablet Take 1 tablet (40 mg total) by mouth daily at 6 PM. (Patient taking differently: Take 40 mg by mouth every morning. ) 30 tablet 6  . tamsulosin (FLOMAX) 0.4 MG CAPS capsule Take 0.4 mg by mouth daily after supper.    . Testosterone (ANDROGEL) 20.25 MG/1.25GM (1.62%) GEL Place 2 application onto the skin daily. 75 g 0  . zolpidem (AMBIEN) 10 MG tablet TAKE 1 TABLET BY MOUTH AT BEDTIME 30 tablet 0   No current facility-administered medications on file prior to visit.    BP 122/72 mmHg  Pulse 111  Temp(Src) 97.8 F (36.6 C) (Oral)  Wt 216 lb 12.8 oz (98.34 kg)  SpO2 94%    Objective:   Physical Exam  Constitutional: He appears well-nourished.  Cardiovascular: Normal rate and regular rhythm.   Pulmonary/Chest: Effort normal and breath sounds normal.  Abdominal:  Non thrombosed 1 cm external hemorrhoid noted to anus.  Musculoskeletal:       Thoracic back: He exhibits decreased range of motion, tenderness and pain. He exhibits no swelling.       Lumbar back: He exhibits decreased range of motion, tenderness and pain. He exhibits no swelling.  Negative straight leg raise.          Assessment & Plan:

## 2015-05-02 ENCOUNTER — Encounter: Payer: Self-pay | Admitting: *Deleted

## 2015-05-02 ENCOUNTER — Ambulatory Visit (INDEPENDENT_AMBULATORY_CARE_PROVIDER_SITE_OTHER)
Admission: RE | Admit: 2015-05-02 | Discharge: 2015-05-02 | Disposition: A | Discharge status: Home / Self Care | Payer: BLUE CROSS/BLUE SHIELD | Source: Ambulatory Visit | Attending: Primary Care | Admitting: Primary Care

## 2015-05-02 ENCOUNTER — Encounter: Payer: Self-pay | Admitting: Family Medicine

## 2015-05-02 ENCOUNTER — Ambulatory Visit (INDEPENDENT_AMBULATORY_CARE_PROVIDER_SITE_OTHER): Payer: BLUE CROSS/BLUE SHIELD | Admitting: Family Medicine

## 2015-05-02 VITALS — BP 114/62 | HR 120 | Temp 97.6°F

## 2015-05-02 DIAGNOSIS — E291 Testicular hypofunction: Secondary | ICD-10-CM

## 2015-05-02 DIAGNOSIS — M5489 Other dorsalgia: Secondary | ICD-10-CM | POA: Diagnosis not present

## 2015-05-02 DIAGNOSIS — R7989 Other specified abnormal findings of blood chemistry: Secondary | ICD-10-CM | POA: Insufficient documentation

## 2015-05-02 LAB — COMPREHENSIVE METABOLIC PANEL
ALK PHOS: 84 U/L (ref 39–117)
ALT: 43 U/L (ref 0–53)
AST: 32 U/L (ref 0–37)
Albumin: 4.2 g/dL (ref 3.5–5.2)
BILIRUBIN TOTAL: 0.3 mg/dL (ref 0.2–1.2)
BUN: 19 mg/dL (ref 6–23)
CALCIUM: 9.6 mg/dL (ref 8.4–10.5)
CHLORIDE: 104 meq/L (ref 96–112)
CO2: 23 meq/L (ref 19–32)
Creatinine, Ser: 0.99 mg/dL (ref 0.40–1.50)
GFR: 86.02 mL/min (ref >60.00)
GLUCOSE: 132 mg/dL — AB (ref 70–99)
POTASSIUM: 4 meq/L (ref 3.5–5.1)
SODIUM: 137 meq/L (ref 135–145)
Total Protein: 6.7 g/dL (ref 6.0–8.3)

## 2015-05-02 LAB — CBC WITH DIFFERENTIAL/PLATELET
BASOS ABS: 0 10*3/uL (ref 0.0–0.1)
BASOS PCT: 0.5 % (ref 0.0–3.0)
Eosinophils Absolute: 0.1 10*3/uL (ref 0.0–0.7)
Eosinophils Relative: 1.3 % (ref 0.0–5.0)
HCT: 46.4 % (ref 39.0–52.0)
Hemoglobin: 15.5 g/dL (ref 13.0–17.0)
LYMPHS ABS: 1.6 10*3/uL (ref 0.7–4.0)
LYMPHS PCT: 20 % (ref 12.0–46.0)
MCHC: 33.4 g/dL (ref 30.0–36.0)
MCV: 91.6 fl (ref 78.0–100.0)
MONOS PCT: 6 % (ref 3.0–12.0)
Monocytes Absolute: 0.5 10*3/uL (ref 0.1–1.0)
NEUTROS ABS: 5.9 10*3/uL (ref 1.4–7.7)
NEUTROS PCT: 72.2 % (ref 43.0–77.0)
PLATELETS: 224 10*3/uL (ref 150.0–400.0)
RBC: 5.07 Mil/uL (ref 4.22–5.81)
RDW: 13.6 % (ref 11.5–15.5)
WBC: 8.1 10*3/uL (ref 4.0–10.5)

## 2015-05-02 LAB — PSA: PSA: 1.07 ng/mL (ref 0.10–4.00)

## 2015-05-02 LAB — TESTOSTERONE: TESTOSTERONE: 240.89 ng/dL — AB (ref 300.00–890.00)

## 2015-05-02 MED ORDER — TESTOSTERONE 20.25 MG/1.25GM (1.62%) TD GEL: 2.0000 "application " | Freq: Every day | TRANSDERMAL | Status: DC

## 2015-05-02 NOTE — Progress Notes (Signed)
Subjective:   Patient ID: [name], male    DOB: October 11, 1965, 49 y.o.   MRN: [id number]  [name] is a pleasant 49 y.o. year old male who presents to clinic today with Neck Pain and Back Pain  on 05/02/2015  HPI:  Saw [facility] on 04/28/15 for ER follow up- was seen in [place] [facility] on 7/29 for acute on chronic back pain and new onset hemorrhoids after an accident at work.  Notes reviewed. He was determined to have acute on chronic back pain and external hemorrhoids without ulceration. He was provided with a script for percocet 5/325 tablets with a quantity of 20 .   Then saw Anda Kraft and reported l feeling better but continues to suffer from chronic daily pain. His pain is mostly located from T3 to L5. Has had some intermittent radiculopathy.  Also complains of hip and shoulder pain. He's not recently had PT and declines. Patient also has not had follow up imaging to his back in over 20 years- has had two serious back injuries in the 80s/90s. He will often lift heavy objects at work and endorses good body mechanics.  She provided him with an rx for an additional 20 tablets of percocet.  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  Urology has not been monitoring this.  Lab Results  Component Value Date   PSA 0.93 01/03/2015   PSA 0.77 11/23/2013   PSA 1.19 01/07/2013   Lab Results  Component Value Date   TESTOSTERONE 254.73* 11/23/2013    Current Outpatient Prescriptions on File Prior to Visit  Medication Sig Dispense Refill  . albuterol (PROAIR HFA) 108 (90 BASE) MCG/ACT inhaler Inhale 1-2 puffs into the lungs every 6 (six) hours as needed for wheezing or shortness of breath. 1 Inhaler 1  . ALPRAZolam (XANAX) 1 MG tablet TAKE 1 TABLET BY MOUTH 3 TIMES A DAY AS NEEDED FOR ANXIETY. 90 tablet 0  . cyclobenzaprine (FLEXERIL) 10 MG tablet Take 1 tablet (10 mg total) by mouth 2 (two) times daily as needed for muscle spasms. 20 tablet 0  .  diphenhydrAMINE (BENADRYL) 25 MG tablet Take 25 mg by mouth at bedtime as needed for allergies.     Marland Kitchen lisinopril (PRINIVIL,ZESTRIL) 20 MG tablet TAKE 1 TABLET BY MOUTH DAILY. 30 tablet 0  . oxyCODONE-acetaminophen (PERCOCET) 5-325 MG per tablet Take 1 tablet by mouth every 6 (six) hours as needed. 20 tablet 0  . pantoprazole (PROTONIX) 40 MG tablet Take 1 tablet (40 mg total) by mouth 2 (two) times daily. 60 tablet 6  . polyethylene glycol powder (GLYCOLAX/MIRALAX) powder Take 17 g by mouth once. 255 g 0  . QUEtiapine (SEROQUEL) 300 MG tablet 300 mg. Take two tablets by mouth at bedtime    . sertraline (ZOLOFT) 100 MG tablet Take 100 mg by mouth daily.     . simvastatin (ZOCOR) 40 MG tablet Take 1 tablet (40 mg total) by mouth daily at 6 PM. (Patient taking differently: Take 40 mg by mouth every morning. ) 30 tablet 6  . tamsulosin (FLOMAX) 0.4 MG CAPS capsule Take 0.4 mg by mouth daily after supper.    . zolpidem (AMBIEN) 10 MG tablet TAKE 1 TABLET BY MOUTH AT BEDTIME 30 tablet 0   No current facility-administered medications on file prior to visit.    No Known Allergies  Past Medical History  Diagnosis Date  . Hyperlipidemia   . Hypertension   .  GERD (gastroesophageal reflux disease)   . Bipolar disorder   . Sleep apnea     test 3/15-waiting results-was told oxygen did drop  . Anxiety     Past Surgical History  Procedure Laterality Date  . Septoplasty  1990  . Bronchoscopy    . Mass excision Left 12/17/2013    Procedure: EXCISION MASS LEFT WRIST;  Surgeon: Tennis Must, MD;  Location: [facility];  Service: Orthopedics;  Laterality: Left;    Family History  Problem Relation Age of Onset  . Cancer Mother     melanoma  . Depression Other   . Cancer Other     skin    History   Social History  . Marital Status: Married    Spouse Name: N/A  . Number of Children: N/A  . Years of Education: N/A   Occupational History  . Not on file.   Social History  Main Topics  . Smoking status: Current Every Day Smoker -- 0.50 packs/day  . Smokeless tobacco: Not on file  . Alcohol Use: No     Comment: quit 2007  . Drug Use: No     Comment: history cocaine-last 2006  . Sexual Activity: Not on file   Other Topics Concern  . Not on file   Social History Narrative   The PMH, PSH, Social History, Family History, Medications, and allergies have been reviewed in [facility], and have been updated if relevant.  Review of Systems  Constitutional: Positive for fatigue.  Cardiovascular: Negative.   Gastrointestinal: Negative.   Genitourinary: Negative.   Musculoskeletal: Positive for back pain. Negative for gait problem.  Neurological: Negative.   Hematological: Negative.   Psychiatric/Behavioral: Negative.   All other systems reviewed and are negative.      Objective:    BP 114/62 mmHg  Pulse 120  Temp(Src) 97.6 F (36.4 C) (Oral)  SpO2 97%   Physical Exam  Constitutional: He is oriented to person, place, and time. He appears well-developed and well-nourished. No distress.  HENT:  Head: Normocephalic.  Eyes: Conjunctivae are normal.  Cardiovascular: Normal rate and normal heart sounds.   Pulmonary/Chest: Effort normal.  Musculoskeletal: Normal range of motion.  Neurological: He is alert and oriented to person, place, and time. No cranial nerve deficit.  Skin: Skin is warm and dry.  Psychiatric: He has a normal mood and affect. His behavior is normal. Judgment and thought content normal.  Nursing note and vitals reviewed.         Assessment & Plan:   Midline back pain, unspecified location - Plan: Ambulatory referral to Orthopedic Surgery, CANCELED: DG Lumbar Spine Complete, CANCELED: DG Thoracic Spine W/Swimmers  Low testosterone - Plan: Testosterone, PSA, CBC with Differential/Platelet, Comprehensive metabolic panel No Follow-up on file.

## 2015-05-02 NOTE — Patient Instructions (Addendum)
Good to see you. We will call you with your xray results and an appointment to see orthopedist.

## 2015-05-02 NOTE — Assessment & Plan Note (Signed)
Acute on chronic. Unfortunately I do not manage chronic back pain with narcotics. Imaging today, refer to ortho. Declines PT. Marland Kitchen

## 2015-05-02 NOTE — Progress Notes (Signed)
Pre visit review using our clinic review tool, if applicable. No additional management support is needed unless otherwise documented below in the visit note. 

## 2015-05-02 NOTE — Assessment & Plan Note (Signed)
Due for labs. Orders Placed This Encounter  Procedures  . Testosterone  . PSA  . CBC with Differential/Platelet  . Comprehensive metabolic panel  . Ambulatory referral to Orthopedic Surgery

## 2015-05-28 ENCOUNTER — Ambulatory Visit (HOSPITAL_COMMUNITY)
Admission: RE | Admit: 2015-05-28 | Discharge: 2015-05-28 | Disposition: A | Discharge status: Home / Self Care | Payer: BLUE CROSS/BLUE SHIELD | Source: Ambulatory Visit | Attending: Specialist | Admitting: Specialist

## 2015-05-28 ENCOUNTER — Other Ambulatory Visit: Payer: Self-pay | Admitting: Specialist

## 2015-05-28 DIAGNOSIS — Z1389 Encounter for screening for other disorder: Secondary | ICD-10-CM | POA: Diagnosis present

## 2015-05-28 DIAGNOSIS — Z01818 Encounter for other preprocedural examination: Secondary | ICD-10-CM

## 2015-06-08 ENCOUNTER — Other Ambulatory Visit: Payer: Self-pay | Admitting: Family Medicine

## 2015-06-14 ENCOUNTER — Other Ambulatory Visit: Payer: Self-pay | Admitting: *Deleted

## 2015-06-14 MED ORDER — TESTOSTERONE 20.25 MG/1.25GM (1.62%) TD GEL: 2.0000 "application " | Freq: Every day | TRANSDERMAL | Status: DC

## 2015-06-14 NOTE — Telephone Encounter (Signed)
Rx faxed to pharmacy  

## 2015-06-14 NOTE — Telephone Encounter (Signed)
Last f/u appt 04/2015 

## 2015-06-21 ENCOUNTER — Other Ambulatory Visit: Payer: Self-pay | Admitting: *Deleted

## 2015-06-21 ENCOUNTER — Encounter: Payer: Self-pay | Admitting: Family Medicine

## 2015-06-21 DIAGNOSIS — M5489 Other dorsalgia: Secondary | ICD-10-CM

## 2015-06-21 MED ORDER — OXYCODONE-ACETAMINOPHEN 5-325 MG PO TABS: 1.0000 | ORAL_TABLET | Freq: Four times a day (QID) | ORAL | Status: DC | PRN

## 2015-06-21 NOTE — Telephone Encounter (Signed)
Patient was last seen for back/neck pain on 04/28/15 and 8/816.  Percocet was refilled #20 at that time.  Patient has since been seen at [facility].  Neck surgery was recommended.  They advised him to contact PCP to refill pain meds as needed in the meantime.  Okay to refill?  Please call patient or wife once completed.

## 2015-06-21 NOTE — Telephone Encounter (Signed)
Spoke to pt and informed him Rx is available for pickup from the front desk; advised third party unable to pickup 

## 2015-06-28 ENCOUNTER — Other Ambulatory Visit: Payer: Self-pay | Admitting: Family Medicine

## 2015-06-28 NOTE — Telephone Encounter (Signed)
Ok to print out and put in my box for signature. 

## 2015-06-28 NOTE — Telephone Encounter (Signed)
Pt recently received Rx on 06/21/2015

## 2015-06-29 ENCOUNTER — Encounter: Payer: Self-pay | Admitting: Family Medicine

## 2015-06-29 NOTE — Telephone Encounter (Signed)
Per Dr Deborra Medina, ok to print #30. Sig to be changed on next Rx

## 2015-07-08 ENCOUNTER — Encounter: Payer: Self-pay | Admitting: Family Medicine

## 2015-07-08 ENCOUNTER — Other Ambulatory Visit: Payer: Self-pay | Admitting: Family Medicine

## 2015-07-08 NOTE — Telephone Encounter (Signed)
Pt received refill on 10/4 and 9/27

## 2015-07-08 NOTE — Telephone Encounter (Signed)
Dr. Deborra Medina last note reviewed She advised him she does not manage back pain with narcotics and referred him to ortho I will defer refill to Dr. Deborra Medina upon her return

## 2015-07-14 ENCOUNTER — Encounter: Payer: Self-pay | Admitting: Family Medicine

## 2015-07-18 ENCOUNTER — Encounter: Payer: Self-pay | Admitting: Family Medicine

## 2015-07-18 ENCOUNTER — Ambulatory Visit (INDEPENDENT_AMBULATORY_CARE_PROVIDER_SITE_OTHER): Payer: BLUE CROSS/BLUE SHIELD | Admitting: Family Medicine

## 2015-07-18 VITALS — BP 132/68 | HR 104 | Temp 97.9°F | Wt 214.5 lb

## 2015-07-18 DIAGNOSIS — M5489 Other dorsalgia: Secondary | ICD-10-CM

## 2015-07-18 MED ORDER — OXYCODONE-ACETAMINOPHEN 5-325 MG PO TABS: 1.0000 | ORAL_TABLET | Freq: Four times a day (QID) | ORAL | Status: DC | PRN

## 2015-07-18 NOTE — Progress Notes (Signed)
Subjective:   Patient ID: [name], male    DOB: 1966/05/31, 49 y.o.   MRN: [id number]  [name] is a pleasant 49 y.o. year old male who presents to clinic today with his wife for Follow-up  on 07/18/2015  HPI:  Chronic pain- he is hear to ask why I have not refilled his percocet for chronic back and neck pain, s/p multiple severe back injuries.  When I saw him on 8/8, I refilled his percocet and explained to him that I would not continue to provide these refills as I do not manage chronic back pain with narcotics.  I therefore referred him to ortho.  We do not have these records, but per pt- saw Dr. Tonita Cong and Dr. Nelva Bush at [place].  Was given cervical spine injection and scheduled to receive another one in two days.  Surgery discussed if injections not effective.      Current Outpatient Prescriptions on File Prior to Visit  Medication Sig Dispense Refill  . albuterol (PROAIR HFA) 108 (90 BASE) MCG/ACT inhaler Inhale 1-2 puffs into the lungs every 6 (six) hours as needed for wheezing or shortness of breath. 1 Inhaler 1  . ALPRAZolam (XANAX) 1 MG tablet TAKE 1 TABLET BY MOUTH 3 TIMES A DAY AS NEEDED FOR ANXIETY. 90 tablet 0  . cyclobenzaprine (FLEXERIL) 10 MG tablet Take 1 tablet (10 mg total) by mouth 2 (two) times daily as needed for muscle spasms. 20 tablet 0  . diphenhydrAMINE (BENADRYL) 25 MG tablet Take 25 mg by mouth at bedtime as needed for allergies.     Marland Kitchen lisinopril (PRINIVIL,ZESTRIL) 20 MG tablet TAKE 1 TABLET BY MOUTH DAILY. 30 tablet 11  . pantoprazole (PROTONIX) 40 MG tablet Take 1 tablet (40 mg total) by mouth 2 (two) times daily. 60 tablet 6  . polyethylene glycol powder (GLYCOLAX/MIRALAX) powder Take 17 g by mouth once. 255 g 0  . QUEtiapine (SEROQUEL) 300 MG tablet 300 mg. Take two tablets by mouth at bedtime    . sertraline (ZOLOFT) 100 MG tablet Take 100 mg by mouth daily.     . simvastatin (ZOCOR) 40 MG tablet Take 1 tablet (40 mg total) by mouth  daily at 6 PM. (Patient taking differently: Take 40 mg by mouth every morning. ) 30 tablet 6  . tamsulosin (FLOMAX) 0.4 MG CAPS capsule Take 0.4 mg by mouth daily after supper.    . Testosterone (ANDROGEL) 20.25 MG/1.25GM (1.62%) GEL Place 2 application onto the skin daily. 75 g 0  . zolpidem (AMBIEN) 10 MG tablet TAKE 1 TABLET BY MOUTH AT BEDTIME 30 tablet 0   No current facility-administered medications on file prior to visit.    No Known Allergies  Past Medical History  Diagnosis Date  . Hyperlipidemia   . Hypertension   . GERD (gastroesophageal reflux disease)   . Bipolar disorder ([place])   . Sleep apnea     test 3/15-waiting results-was told oxygen did drop  . Anxiety     Past Surgical History  Procedure Laterality Date  . Septoplasty  1990  . Bronchoscopy    . Mass excision Left 12/17/2013    Procedure: EXCISION MASS LEFT WRIST;  Surgeon: Tennis Must, MD;  Location: [facility];  Service: Orthopedics;  Laterality: Left;    Family History  Problem Relation Age of Onset  . Cancer Mother     melanoma  . Depression Other   . Cancer Other  skin    Social History   Social History  . Marital Status: Married    Spouse Name: N/A  . Number of Children: N/A  . Years of Education: N/A   Occupational History  . Not on file.   Social History Main Topics  . Smoking status: Current Every Day Smoker -- 0.50 packs/day  . Smokeless tobacco: Not on file  . Alcohol Use: No     Comment: quit 2007  . Drug Use: No     Comment: history cocaine-last 2006  . Sexual Activity: Not on file   Other Topics Concern  . Not on file   Social History Narrative   The PMH, PSH, Social History, Family History, Medications, and allergies have been reviewed in [facility], and have been updated if relevant.  Review of Systems  Constitutional: Positive for fatigue.  Cardiovascular: Negative.   Gastrointestinal: Negative.   Genitourinary: Negative.   Musculoskeletal: Positive  for back pain. Negative for gait problem.  Neurological: Negative.   Hematological: Negative.   Psychiatric/Behavioral: Negative.   All other systems reviewed and are negative.      Objective:    BP 132/68 mmHg  Pulse 104  Temp(Src) 97.9 F (36.6 C) (Oral)  Wt 214 lb 8 oz (97.297 kg)  SpO2 98%   Physical Exam  Constitutional: He is oriented to person, place, and time. He appears well-developed and well-nourished. No distress.  HENT:  Head: Normocephalic.  Eyes: Conjunctivae are normal.  Neck: Normal range of motion.  Cardiovascular: Normal rate and normal heart sounds.   Pulmonary/Chest: Effort normal.  Musculoskeletal: Normal range of motion.  Neurological: He is alert and oriented to person, place, and time. No cranial nerve deficit.  Skin: Skin is warm and dry.  Psychiatric: He has a normal mood and affect. His behavior is normal. Judgment and thought content normal.  Nursing note and vitals reviewed.         Assessment & Plan:   Midline back pain, unspecified location No Follow-up on file.

## 2015-07-18 NOTE — Progress Notes (Signed)
Pre visit review using our clinic review tool, if applicable. No additional management support is needed unless otherwise documented below in the visit note. 

## 2015-07-18 NOTE — Assessment & Plan Note (Signed)
>  25 minutes spent in face to face time with patient, >50% spent in counselling or coordination of care Explained again that I do not manage chronic pain.  Dr. Nelva Bush, per pt, will not give narcotics since I have been prescribing them.  Will request records.  Given rx for #90 and advised that this should get him through to surgery and I WILL NOT PROVIDE ADDITIONAL REFILLS.

## 2015-07-28 ENCOUNTER — Encounter: Payer: Self-pay | Admitting: Family Medicine

## 2015-08-01 ENCOUNTER — Ambulatory Visit: Payer: Self-pay | Admitting: Physician Assistant

## 2015-08-02 ENCOUNTER — Telehealth: Payer: Self-pay | Admitting: *Deleted

## 2015-08-02 NOTE — Telephone Encounter (Signed)
Form received indicating surgical clearance required. LM on pt vm requesting a call back to schedule OV

## 2015-08-04 ENCOUNTER — Ambulatory Visit (INDEPENDENT_AMBULATORY_CARE_PROVIDER_SITE_OTHER): Payer: BLUE CROSS/BLUE SHIELD | Admitting: Family Medicine

## 2015-08-04 ENCOUNTER — Encounter: Payer: Self-pay | Admitting: Family Medicine

## 2015-08-04 VITALS — BP 132/78 | HR 108 | Temp 97.8°F | Wt 219.8 lb

## 2015-08-04 DIAGNOSIS — Z01810 Encounter for preprocedural cardiovascular examination: Secondary | ICD-10-CM | POA: Diagnosis not present

## 2015-08-04 DIAGNOSIS — Z23 Encounter for immunization: Secondary | ICD-10-CM

## 2015-08-04 DIAGNOSIS — Z1211 Encounter for screening for malignant neoplasm of colon: Secondary | ICD-10-CM | POA: Insufficient documentation

## 2015-08-04 NOTE — Progress Notes (Signed)
Pre visit review using our clinic review tool, if applicable. No additional management support is needed unless otherwise documented below in the visit note. 

## 2015-08-04 NOTE — Progress Notes (Signed)
Subjective:    [name] is a 49 y.o. male who presents to the office today for a preoperative consultation at the request of surgeon Dr. Rolena Infante who plans on performing AVDE C5-7 on to be determined date. Planned anesthesia: general. The patient has the following known anesthesia issues: none.   Has never had issues with intubation or anesthesia in past.  The following portions of the patient's history were reviewed and updated as appropriate: allergies, current medications, past family history, past medical history, past social history, past surgical history and problem list.  Review of Systems Pertinent items are noted in HPI.    Objective:    BP 132/78 mmHg  Pulse 108  Temp(Src) 97.8 F (36.6 C) (Oral)  Wt 219 lb 12 oz (99.678 kg)  SpO2 99%  General Appearance:    Alert, cooperative, no distress, appears stated age  Head:    Normocephalic, without obvious abnormality, atraumatic  Eyes:    PERRL, conjunctiva/corneas clear, EOM's intact, fundi    benign, both eyes       Ears:    Normal TM's and external ear canals, both ears  Nose:   Nares normal, septum midline, mucosa normal, no drainage    or sinus tenderness  Throat:   Lips, mucosa, and tongue normal; teeth and gums normal  Neck:   Supple, symmetrical, trachea midline, no adenopathy;       thyroid:  No enlargement/tenderness/nodules; no carotid   bruit or JVD  Back:     Symmetric, no curvature, ROM normal, no CVA tenderness  Lungs:     Clear to auscultation bilaterally, respirations unlabored  Chest wall:    No tenderness or deformity  Heart:    Regular rate and rhythm, S1 and S2 normal, no murmur, rub   or gallop  Extremities:   Extremities normal, atraumatic, no cyanosis or edema  Pulses:   2+ and symmetric all extremities  Skin:   Skin color, texture, turgor normal, no rashes or lesions  Lymph nodes:   Cervical, supraclavicular, and axillary nodes normal  Neurologic:   CNII-XII intact. Normal strength, sensation  and reflexes      throughout     Cardiographics ECG: short PR, otherwise NSR  Appears relatively unchanged from prior- difficult to tell since prior EKG was done in ER and was tachycardic at the time.   Lab Review  Office Visit on 05/02/2015  Component Date Value  . Testosterone 05/02/2015 240.89*  . PSA 05/02/2015 1.07   . WBC 05/02/2015 8.1   . RBC 05/02/2015 5.07   . Hemoglobin 05/02/2015 15.5   . HCT 05/02/2015 46.4   . MCV 05/02/2015 91.6   . MCHC 05/02/2015 33.4   . RDW 05/02/2015 13.6   . Platelets 05/02/2015 224.0   . Neutrophils Relative % 05/02/2015 72.2   . Lymphocytes Relative 05/02/2015 20.0   . Monocytes Relative 05/02/2015 6.0   . Eosinophils Relative 05/02/2015 1.3   . Basophils Relative 05/02/2015 0.5   . Neutro Abs 05/02/2015 5.9   . Lymphs Abs 05/02/2015 1.6   . Monocytes Absolute 05/02/2015 0.5   . Eosinophils Absolute 05/02/2015 0.1   . Basophils Absolute 05/02/2015 0.0   . Sodium 05/02/2015 137   . Potassium 05/02/2015 4.0   . Chloride 05/02/2015 104   . CO2 05/02/2015 23   . Glucose, Bld 05/02/2015 132*  . BUN 05/02/2015 19   . Creatinine, Ser 05/02/2015 0.99   . Total Bilirubin 05/02/2015 0.3   . Alkaline Phosphatase  05/02/2015 84   . AST 05/02/2015 32   . ALT 05/02/2015 43   . Total Protein 05/02/2015 6.7   . Albumin 05/02/2015 4.2   . Calcium 05/02/2015 9.6   . GFR 05/02/2015 86.02       Assessment/Plan:      48 y.o. male with planned surgery as above.   Known risk factors for perioperative complications: None   Difficulty with intubation is not anticipated.  Cardiac Risk Estimation: low

## 2015-08-05 ENCOUNTER — Other Ambulatory Visit: Payer: Self-pay | Admitting: Family Medicine

## 2015-08-08 ENCOUNTER — Other Ambulatory Visit (HOSPITAL_COMMUNITY): Payer: Self-pay | Admitting: *Deleted

## 2015-08-09 ENCOUNTER — Other Ambulatory Visit: Payer: Self-pay | Admitting: *Deleted

## 2015-08-09 ENCOUNTER — Encounter (HOSPITAL_COMMUNITY)
Admission: RE | Admit: 2015-08-09 | Discharge: 2015-08-09 | Disposition: A | Discharge status: Home / Self Care | Payer: BLUE CROSS/BLUE SHIELD | Source: Ambulatory Visit | Attending: Orthopedic Surgery | Admitting: Orthopedic Surgery

## 2015-08-09 ENCOUNTER — Encounter (HOSPITAL_COMMUNITY): Payer: Self-pay

## 2015-08-09 DIAGNOSIS — I10 Essential (primary) hypertension: Secondary | ICD-10-CM | POA: Insufficient documentation

## 2015-08-09 DIAGNOSIS — Z01812 Encounter for preprocedural laboratory examination: Secondary | ICD-10-CM | POA: Diagnosis not present

## 2015-08-09 DIAGNOSIS — Z01818 Encounter for other preprocedural examination: Secondary | ICD-10-CM | POA: Insufficient documentation

## 2015-08-09 DIAGNOSIS — E785 Hyperlipidemia, unspecified: Secondary | ICD-10-CM | POA: Diagnosis not present

## 2015-08-09 DIAGNOSIS — Z79899 Other long term (current) drug therapy: Secondary | ICD-10-CM | POA: Diagnosis not present

## 2015-08-09 DIAGNOSIS — F319 Bipolar disorder, unspecified: Secondary | ICD-10-CM | POA: Diagnosis not present

## 2015-08-09 DIAGNOSIS — F419 Anxiety disorder, unspecified: Secondary | ICD-10-CM | POA: Diagnosis not present

## 2015-08-09 DIAGNOSIS — K219 Gastro-esophageal reflux disease without esophagitis: Secondary | ICD-10-CM | POA: Diagnosis not present

## 2015-08-09 DIAGNOSIS — M5412 Radiculopathy, cervical region: Secondary | ICD-10-CM | POA: Insufficient documentation

## 2015-08-09 HISTORY — DX: Reserved for inherently not codable concepts without codable children: IMO0001

## 2015-08-09 HISTORY — DX: Pneumonia, unspecified organism: J18.9

## 2015-08-09 HISTORY — DX: Personal history of other medical treatment: Z92.89

## 2015-08-09 HISTORY — DX: Unspecified osteoarthritis, unspecified site: M19.90

## 2015-08-09 LAB — COMPREHENSIVE METABOLIC PANEL
ALBUMIN: 3.8 g/dL (ref 3.5–5.0)
ALT: 26 U/L (ref 17–63)
ANION GAP: 7 (ref 5–15)
AST: 26 U/L (ref 15–41)
Alkaline Phosphatase: 79 U/L (ref 38–126)
BILIRUBIN TOTAL: 0.3 mg/dL (ref 0.3–1.2)
BUN: 12 mg/dL (ref 6–20)
CO2: 24 mmol/L (ref 22–32)
Calcium: 9.1 mg/dL (ref 8.9–10.3)
Chloride: 104 mmol/L (ref 101–111)
Creatinine, Ser: 1.08 mg/dL (ref 0.61–1.24)
GFR calc non Af Amer: >60 mL/min (ref >60)
GLUCOSE: 112 mg/dL — AB (ref 65–99)
POTASSIUM: 4.5 mmol/L (ref 3.5–5.1)
SODIUM: 135 mmol/L (ref 135–145)
TOTAL PROTEIN: 6.4 g/dL — AB (ref 6.5–8.1)

## 2015-08-09 LAB — CBC
HEMATOCRIT: 44.2 % (ref 39.0–52.0)
HEMOGLOBIN: 15.2 g/dL (ref 13.0–17.0)
MCH: 31.3 pg (ref 26.0–34.0)
MCHC: 34.4 g/dL (ref 30.0–36.0)
MCV: 90.9 fL (ref 78.0–100.0)
Platelets: 217 10*3/uL (ref 150–400)
RBC: 4.86 MIL/uL (ref 4.22–5.81)
RDW: 13 % (ref 11.5–15.5)
WBC: 7.9 10*3/uL (ref 4.0–10.5)

## 2015-08-09 LAB — SURGICAL PCR SCREEN
MRSA, PCR: NEGATIVE
Staphylococcus aureus: NEGATIVE

## 2015-08-09 MED ORDER — TESTOSTERONE 20.25 MG/1.25GM (1.62%) TD GEL: 2.0000 "application " | Freq: Every day | TRANSDERMAL | Status: DC

## 2015-08-09 NOTE — Progress Notes (Signed)
Pt. Denies chest complaints, no pain, change in breathing. Sent fax request to Dr. Deborra Medina for records regarding pt. report that he had sleep study & complete heart workup- about 2- yrs. Ago. States everything turned up OK, but states it was in [place] & is not sure of where, states Dr. Deborra Medina ordered the workup.

## 2015-08-09 NOTE — Telephone Encounter (Signed)
Last f/u 06/2015 

## 2015-08-09 NOTE — Telephone Encounter (Signed)
Rx called in to requested pharmacy 

## 2015-08-09 NOTE — Telephone Encounter (Signed)
Which rx? 

## 2015-08-09 NOTE — Pre-Procedure Instructions (Signed)
Smith Mattia Bartoli  08/09/2015      [facility] - [place], [place] [address] Phone: [phone] Fax: [phone]  [place], [address] [place] [address] Phone: [phone] Fax: [phone]    Your procedure is scheduled on 08/17/2015  Report to [facility] Admitting at 6:30 A.M.  Call this number if you have problems the morning of surgery:  [phone]   Remember:  Do not eat food or drink liquids after midnight.  On 08/16/2015- Tuesday  Take these medicines the morning of surgery with A SIP OF WATER : Alprazolam, use inhaler if needed, pain medicine if needed, ZOLOFT, PROTONIX   Do not wear jewelry   Do not wear lotions, powders, or perfumes.  You may wear deodorant.    Men may shave face and neck.   Do not bring valuables to the hospital.   [facility] is not responsible for any belongings or valuables.  Contacts, dentures or bridgework may not be worn into surgery.  Leave your suitcase in the car.  After surgery it may be brought to your room.  For patients admitted to the hospital, discharge time will be determined by your treatment team.  Patients discharged the day of surgery will not be allowed to drive home.   Name and phone number of your driver:   With wife   Special instructions:  Special Instructions: [place] - Preparing for Surgery  Before surgery, you can play an important role.  Because skin is not sterile, your skin needs to be as free of germs as possible.  You can reduce the number of germs on you skin by washing with CHG (chlorahexidine gluconate) soap before surgery.  CHG is an antiseptic cleaner which kills germs and bonds with the skin to continue killing germs even after washing.  Please DO NOT use if you have an allergy to CHG or antibacterial soaps.  If your skin becomes reddened/irritated  stop using the CHG and inform your nurse when you arrive at Short Stay.  Do not shave (including legs and underarms) for at least 48 hours prior to the first CHG shower.  You may shave your face.  Please follow these instructions carefully:   1.  Shower with CHG Soap the night before surgery and the  morning of Surgery.  2.  If you choose to wash your hair, wash your hair first as usual with your  normal shampoo.  3.  After you shampoo, rinse your hair and body thoroughly to remove the  Shampoo.  4.  Use CHG as you would any other liquid soap.  You can apply chg directly to the skin and wash gently with scrungie or a clean washcloth.  5.  Apply the CHG Soap to your body ONLY FROM THE NECK DOWN.    Do not use on open wounds or open sores.  Avoid contact with your eyes, ears, mouth and genitals (private parts).  Wash genitals (private parts)   with your normal soap.  6.  Wash thoroughly, paying special attention to the area where your surgery will be performed.  7.  Thoroughly rinse your body with warm water from the neck down.  8.  DO NOT shower/wash with your normal soap after using and rinsing off   the CHG Soap.  9.  Pat yourself dry with a  clean towel.            10.  Wear clean pajamas.            11.  Place clean sheets on your bed the night of your first shower and do not sleep with pets.  Day of Surgery  Do not apply any lotions/deodorants the morning of surgery.  Please wear clean clothes to the hospital/surgery center.  Please read over the following fact sheets that you were given. Pain Booklet, Coughing and Deep Breathing, MRSA Information and Surgical Site Infection Prevention

## 2015-08-10 NOTE — Progress Notes (Addendum)
Anesthesia Chart Review: Patient is a 49 year old male scheduled for C5-7 ACDF on 08/17/15 by Dr. Rolena Infante.  History includes smoking, HLD, HTN, GERD, Bipolar disorder, anxiety, arthritis, septoplasty. BMI is consistent with obesity. PCP is Dr. Tanja Port Aron--patient seen for pre-operative clearance on 08/04/15 with EKG. She did sign a note of surgical clearance (copy on chart).  Patient reports he was never told he needed CPAP or home nocturnal O2.   Meds include albuterol, Xanax, Flexeril, Benadryl, lisinopril, Percocet, Afrin, Protonix, Seruquel, Zoloft, Zocor, Flomax, Ambien.  08/04/15 EKG: SR with short PR.  12/21/13 Echo [facility]): Conclusions: Normal LVEF 55%. Normal valves. Diastolic dysfunction. (Findings stated structurally normal mitral and tricuspid valves with normal diastolic flow pattern.)  [id number] CXR: IMPRESSION: There is a stable appearance of the left lower lateral hemithorax where there is ongoing healing of lateral seventh and eighth ribs fractures. There is adjacent pleural thickening.  11/30/13 Sleep Study [facility]): Overnight polysomnogram demonstrates the absence of significant sleep disordered breathing. There was associated arterial oxygen desaturations noted with a minimum of 77%. Severe snoring. Recommendations: CPAP titration study is not indicated in this clinical setting. O2 at 2L/[place] may be started due to continued desaturations. Nasal decongestants and anti-histamines may be of help for increased upper airway resistance. Weight loss through dietary and lifestyle modification is recommended. Alternative treatment options to CPAP if the patient is not willing to use CPAP include oral appliances as well as surgical intervention which may help in the appropriate patient.   01/27/14 PFTs [facility]): FVC 3.92 (80%), FEV1 3.52 (88%), DLCO 17.85 (56%). No significant response to BD.  Preoperative labs noted.   With his history of nocturnal desaturations, I  think he would benefit from overnight stay which patient says is the plan. Consider overnight O2 saturation monitoring--will defer to surgeon or anesthesiologist depending on how he does in PACU. I have discussed with anesthesiologist Dr. Linna Caprice.  George Hugh [facility] Short Stay Center/Anesthesiology Phone [phone] 08/10/2015 3:46 PM

## 2015-08-16 MED ORDER — CEFAZOLIN SODIUM-DEXTROSE 2-3 GM-% IV SOLR
2.0000 g | INTRAVENOUS | Status: AC
  Administered 2015-08-17: 2 g via INTRAVENOUS
  Filled 2015-08-16: qty 50

## 2015-08-16 NOTE — Anesthesia Preprocedure Evaluation (Addendum)
Anesthesia Evaluation  Patient identified by MRN, date of birth, ID band Patient awake    Reviewed: Allergy & Precautions, NPO status , Patient's Chart, lab work & pertinent test results  Airway Mallampati: II  TM Distance: >3 FB Neck ROM: Limited    Dental  (+) Teeth Intact   Pulmonary sleep apnea , Current Smoker,    breath sounds clear to auscultation       Cardiovascular hypertension, Pt. on medications  Rhythm:Regular     Neuro/Psych PSYCHIATRIC DISORDERS Anxiety Bipolar Disorder    GI/Hepatic Neg liver ROS, GERD  Medicated,  Endo/Other  negative endocrine ROS  Renal/GU negative Renal ROS  negative genitourinary   Musculoskeletal  (+) Arthritis ,   Abdominal   Peds negative pediatric ROS (+)  Hematology negative hematology ROS (+)   Anesthesia Other Findings   Reproductive/Obstetrics negative OB ROS                            Lab Results  Component Value Date   WBC 7.9 08/09/2015   HGB 15.2 08/09/2015   HCT 44.2 08/09/2015   MCV 90.9 08/09/2015   PLT 217 08/09/2015   Lab Results  Component Value Date   CREATININE 1.08 08/09/2015   BUN 12 08/09/2015   NA 135 08/09/2015   K 4.5 08/09/2015   CL 104 08/09/2015   CO2 24 08/09/2015   No results found for: INR, PROTIME  EKG: normal sinus rhythm.  Echo 2004 - Overall left ventricular systolic function was normal. Left    ventricular ejection fraction was estimated , range being 55    % to 65 %. There were no left ventricular regional wall    motion abnormalities. - Aortic valve thickness was mildly increased. - There was mild mitral valvular regurgitation. - There was moderate tricuspid valvular regurgitation.  Anesthesia Physical Anesthesia Plan  ASA: II  Anesthesia Plan: General   Post-op Pain Management:    Induction: Intravenous  Airway Management Planned: Oral ETT  Additional Equipment:    Intra-op Plan:   Post-operative Plan: Extubation in OR  Informed Consent: I have reviewed the patients History and Physical, chart, labs and discussed the procedure including the risks, benefits and alternatives for the proposed anesthesia with the patient or authorized representative who has indicated his/her understanding and acceptance.   Dental advisory given  Plan Discussed with: CRNA  Anesthesia Plan Comments:         Anesthesia Quick Evaluation

## 2015-08-17 ENCOUNTER — Encounter (HOSPITAL_COMMUNITY)
Admission: RE | Disposition: A | Discharge status: Home / Self Care | Payer: Self-pay | Source: Ambulatory Visit | Attending: Orthopedic Surgery

## 2015-08-17 ENCOUNTER — Observation Stay (HOSPITAL_COMMUNITY)
Admission: RE | Admit: 2015-08-17 | Discharge: 2015-08-18 | Disposition: A | Discharge status: Home / Self Care | Payer: BLUE CROSS/BLUE SHIELD | Source: Ambulatory Visit | Attending: Orthopedic Surgery | Admitting: Orthopedic Surgery

## 2015-08-17 ENCOUNTER — Ambulatory Visit (HOSPITAL_COMMUNITY): Payer: BLUE CROSS/BLUE SHIELD | Admitting: Anesthesiology

## 2015-08-17 ENCOUNTER — Encounter (HOSPITAL_COMMUNITY): Payer: Self-pay | Admitting: Surgery

## 2015-08-17 ENCOUNTER — Observation Stay (HOSPITAL_COMMUNITY): Payer: BLUE CROSS/BLUE SHIELD

## 2015-08-17 ENCOUNTER — Ambulatory Visit (HOSPITAL_COMMUNITY): Payer: BLUE CROSS/BLUE SHIELD

## 2015-08-17 ENCOUNTER — Ambulatory Visit (HOSPITAL_COMMUNITY): Payer: BLUE CROSS/BLUE SHIELD | Admitting: Vascular Surgery

## 2015-08-17 DIAGNOSIS — M4802 Spinal stenosis, cervical region: Secondary | ICD-10-CM | POA: Diagnosis not present

## 2015-08-17 DIAGNOSIS — E785 Hyperlipidemia, unspecified: Secondary | ICD-10-CM | POA: Diagnosis not present

## 2015-08-17 DIAGNOSIS — K219 Gastro-esophageal reflux disease without esophagitis: Secondary | ICD-10-CM | POA: Insufficient documentation

## 2015-08-17 DIAGNOSIS — M4722 Other spondylosis with radiculopathy, cervical region: Secondary | ICD-10-CM | POA: Insufficient documentation

## 2015-08-17 DIAGNOSIS — I10 Essential (primary) hypertension: Secondary | ICD-10-CM | POA: Diagnosis not present

## 2015-08-17 DIAGNOSIS — F17210 Nicotine dependence, cigarettes, uncomplicated: Secondary | ICD-10-CM | POA: Diagnosis not present

## 2015-08-17 DIAGNOSIS — Z419 Encounter for procedure for purposes other than remedying health state, unspecified: Secondary | ICD-10-CM

## 2015-08-17 DIAGNOSIS — F419 Anxiety disorder, unspecified: Secondary | ICD-10-CM | POA: Insufficient documentation

## 2015-08-17 DIAGNOSIS — Z9889 Other specified postprocedural states: Secondary | ICD-10-CM

## 2015-08-17 DIAGNOSIS — M542 Cervicalgia: Secondary | ICD-10-CM | POA: Diagnosis present

## 2015-08-17 DIAGNOSIS — F319 Bipolar disorder, unspecified: Secondary | ICD-10-CM | POA: Diagnosis not present

## 2015-08-17 DIAGNOSIS — G4730 Sleep apnea, unspecified: Secondary | ICD-10-CM | POA: Diagnosis not present

## 2015-08-17 DIAGNOSIS — M1990 Unspecified osteoarthritis, unspecified site: Secondary | ICD-10-CM | POA: Diagnosis not present

## 2015-08-17 HISTORY — PX: ANTERIOR CERVICAL DECOMP/DISCECTOMY FUSION: SHX1161

## 2015-08-17 SURGERY — ANTERIOR CERVICAL DECOMPRESSION/DISCECTOMY FUSION 2 LEVELS
Anesthesia: General

## 2015-08-17 MED ORDER — SODIUM CHLORIDE 0.9 % IJ SOLN
INTRAMUSCULAR | Status: AC
  Filled 2015-08-17: qty 10

## 2015-08-17 MED ORDER — OXYCODONE HCL 5 MG PO TABS
10.0000 mg | ORAL_TABLET | ORAL | Status: DC | PRN
  Administered 2015-08-17 – 2015-08-18 (×5): 10 mg via ORAL
  Filled 2015-08-17 (×5): qty 2

## 2015-08-17 MED ORDER — METHOCARBAMOL 500 MG PO TABS: 500.0000 mg | ORAL_TABLET | Freq: Three times a day (TID) | ORAL | Status: DC | PRN

## 2015-08-17 MED ORDER — LIDOCAINE HCL (CARDIAC) 20 MG/ML IV SOLN
INTRAVENOUS | Status: AC
  Filled 2015-08-17: qty 10

## 2015-08-17 MED ORDER — GLYCOPYRROLATE 0.2 MG/ML IJ SOLN
INTRAMUSCULAR | Status: AC
  Filled 2015-08-17: qty 3

## 2015-08-17 MED ORDER — SODIUM CHLORIDE 0.9 % IJ SOLN: 3.0000 mL | INTRAMUSCULAR | Status: DC | PRN

## 2015-08-17 MED ORDER — ACETAMINOPHEN 10 MG/ML IV SOLN
INTRAVENOUS | Status: AC
  Filled 2015-08-17: qty 100

## 2015-08-17 MED ORDER — CEFAZOLIN SODIUM 1-5 GM-% IV SOLN
1.0000 g | Freq: Three times a day (TID) | INTRAVENOUS | Status: AC
  Administered 2015-08-17 (×2): 1 g via INTRAVENOUS
  Filled 2015-08-17 (×2): qty 50

## 2015-08-17 MED ORDER — HYDROMORPHONE HCL 1 MG/ML IJ SOLN
0.2500 mg | INTRAMUSCULAR | Status: DC | PRN
  Administered 2015-08-17 (×4): 0.5 mg via INTRAVENOUS

## 2015-08-17 MED ORDER — ACETAMINOPHEN 10 MG/ML IV SOLN
INTRAVENOUS | Status: DC | PRN
  Administered 2015-08-17: 1000 mg via INTRAVENOUS

## 2015-08-17 MED ORDER — MIDAZOLAM HCL 2 MG/2ML IJ SOLN
INTRAMUSCULAR | Status: AC
  Filled 2015-08-17: qty 2

## 2015-08-17 MED ORDER — METHOCARBAMOL 500 MG PO TABS
500.0000 mg | ORAL_TABLET | Freq: Four times a day (QID) | ORAL | Status: DC | PRN
  Administered 2015-08-17 – 2015-08-18 (×3): 500 mg via ORAL
  Filled 2015-08-17 (×3): qty 1

## 2015-08-17 MED ORDER — DEXAMETHASONE SODIUM PHOSPHATE 4 MG/ML IJ SOLN: 4.0000 mg | Freq: Four times a day (QID) | INTRAMUSCULAR | Status: AC

## 2015-08-17 MED ORDER — SERTRALINE HCL 50 MG PO TABS
100.0000 mg | ORAL_TABLET | Freq: Every day | ORAL | Status: DC
  Administered 2015-08-18: 100 mg via ORAL
  Filled 2015-08-17: qty 2

## 2015-08-17 MED ORDER — LACTATED RINGERS IV SOLN: INTRAVENOUS | Status: DC

## 2015-08-17 MED ORDER — FENTANYL CITRATE (PF) 250 MCG/5ML IJ SOLN
INTRAMUSCULAR | Status: AC
  Filled 2015-08-17: qty 5

## 2015-08-17 MED ORDER — ROCURONIUM BROMIDE 50 MG/5ML IV SOLN
INTRAVENOUS | Status: AC
  Filled 2015-08-17: qty 1

## 2015-08-17 MED ORDER — LIDOCAINE HCL (CARDIAC) 20 MG/ML IV SOLN
INTRAVENOUS | Status: DC | PRN
  Administered 2015-08-17: 100 mg via INTRATRACHEAL
  Administered 2015-08-17: 80 mg via INTRAVENOUS

## 2015-08-17 MED ORDER — LISINOPRIL 20 MG PO TABS
20.0000 mg | ORAL_TABLET | Freq: Every day | ORAL | Status: DC
  Administered 2015-08-17 – 2015-08-18 (×2): 20 mg via ORAL
  Filled 2015-08-17 (×2): qty 1

## 2015-08-17 MED ORDER — ALBUTEROL SULFATE HFA 108 (90 BASE) MCG/ACT IN AERS
INHALATION_SPRAY | RESPIRATORY_TRACT | Status: AC
Start: 2015-08-17 — End: 2015-08-17
  Filled 2015-08-17: qty 6.7

## 2015-08-17 MED ORDER — ROCURONIUM BROMIDE 100 MG/10ML IV SOLN
INTRAVENOUS | Status: DC | PRN
  Administered 2015-08-17: 10 mg via INTRAVENOUS
  Administered 2015-08-17: 50 mg via INTRAVENOUS

## 2015-08-17 MED ORDER — OXYCODONE-ACETAMINOPHEN 10-325 MG PO TABS: 1.0000 | ORAL_TABLET | ORAL | Status: AC | PRN

## 2015-08-17 MED ORDER — SODIUM CHLORIDE 0.9 % IJ SOLN
3.0000 mL | Freq: Two times a day (BID) | INTRAMUSCULAR | Status: DC
  Administered 2015-08-17 (×2): 3 mL via INTRAVENOUS

## 2015-08-17 MED ORDER — 0.9 % SODIUM CHLORIDE (POUR BTL) OPTIME
TOPICAL | Status: DC | PRN
  Administered 2015-08-17 (×3): 1000 mL

## 2015-08-17 MED ORDER — ALPRAZOLAM 0.5 MG PO TABS
1.0000 mg | ORAL_TABLET | Freq: Three times a day (TID) | ORAL | Status: DC
  Administered 2015-08-17 – 2015-08-18 (×3): 1 mg via ORAL
  Filled 2015-08-17 (×3): qty 2

## 2015-08-17 MED ORDER — BUPIVACAINE-EPINEPHRINE (PF) 0.25% -1:200000 IJ SOLN
INTRAMUSCULAR | Status: AC
  Filled 2015-08-17: qty 30

## 2015-08-17 MED ORDER — EPHEDRINE SULFATE 50 MG/ML IJ SOLN
INTRAMUSCULAR | Status: AC
  Filled 2015-08-17: qty 1

## 2015-08-17 MED ORDER — HYDROMORPHONE HCL 1 MG/ML IJ SOLN
INTRAMUSCULAR | Status: AC
  Filled 2015-08-17: qty 1

## 2015-08-17 MED ORDER — PROPOFOL 10 MG/ML IV BOLUS
INTRAVENOUS | Status: AC
  Filled 2015-08-17: qty 20

## 2015-08-17 MED ORDER — ONDANSETRON HCL 4 MG PO TABS: 4.0000 mg | ORAL_TABLET | Freq: Three times a day (TID) | ORAL | Status: DC | PRN

## 2015-08-17 MED ORDER — QUETIAPINE FUMARATE 300 MG PO TABS
600.0000 mg | ORAL_TABLET | Freq: Every day | ORAL | Status: DC
  Administered 2015-08-17: 600 mg via ORAL
  Filled 2015-08-17 (×2): qty 2

## 2015-08-17 MED ORDER — ALBUTEROL SULFATE HFA 108 (90 BASE) MCG/ACT IN AERS: 1.0000 | INHALATION_SPRAY | Freq: Four times a day (QID) | RESPIRATORY_TRACT | Status: DC | PRN

## 2015-08-17 MED ORDER — SIMVASTATIN 40 MG PO TABS
40.0000 mg | ORAL_TABLET | Freq: Every day | ORAL | Status: DC
  Administered 2015-08-17: 40 mg via ORAL
  Filled 2015-08-17 (×2): qty 1

## 2015-08-17 MED ORDER — BUPIVACAINE-EPINEPHRINE 0.25% -1:200000 IJ SOLN
INTRAMUSCULAR | Status: DC | PRN
  Administered 2015-08-17: 8 mL

## 2015-08-17 MED ORDER — METHOCARBAMOL 1000 MG/10ML IJ SOLN
500.0000 mg | INTRAVENOUS | Status: AC
  Administered 2015-08-17: 500 mg via INTRAVENOUS
  Filled 2015-08-17: qty 5

## 2015-08-17 MED ORDER — MENTHOL 3 MG MT LOZG: 1.0000 | LOZENGE | OROMUCOSAL | Status: DC | PRN

## 2015-08-17 MED ORDER — TAMSULOSIN HCL 0.4 MG PO CAPS
0.8000 mg | ORAL_CAPSULE | Freq: Every day | ORAL | Status: DC
  Administered 2015-08-17: 0.8 mg via ORAL
  Filled 2015-08-17: qty 2

## 2015-08-17 MED ORDER — ONDANSETRON HCL 4 MG/2ML IJ SOLN
INTRAMUSCULAR | Status: AC
  Filled 2015-08-17: qty 2

## 2015-08-17 MED ORDER — GLYCOPYRROLATE 0.2 MG/ML IJ SOLN
INTRAMUSCULAR | Status: DC | PRN
  Administered 2015-08-17: 0.6 mg via INTRAVENOUS

## 2015-08-17 MED ORDER — ONDANSETRON HCL 4 MG/2ML IJ SOLN
INTRAMUSCULAR | Status: DC | PRN
  Administered 2015-08-17: 4 mg via INTRAVENOUS

## 2015-08-17 MED ORDER — DEXAMETHASONE SODIUM PHOSPHATE 10 MG/ML IJ SOLN
INTRAMUSCULAR | Status: DC | PRN
  Administered 2015-08-17: 8 mg via INTRAVENOUS

## 2015-08-17 MED ORDER — THROMBIN 20000 UNITS EX SOLR
CUTANEOUS | Status: AC
Start: 2015-08-17 — End: 2015-08-17
  Filled 2015-08-17: qty 20000

## 2015-08-17 MED ORDER — PROPOFOL 10 MG/ML IV BOLUS
INTRAVENOUS | Status: DC | PRN
  Administered 2015-08-17: 160 mg via INTRAVENOUS

## 2015-08-17 MED ORDER — DEXAMETHASONE SODIUM PHOSPHATE 10 MG/ML IJ SOLN
INTRAMUSCULAR | Status: AC
  Filled 2015-08-17: qty 1

## 2015-08-17 MED ORDER — METHOCARBAMOL 1000 MG/10ML IJ SOLN
500.0000 mg | Freq: Four times a day (QID) | INTRAVENOUS | Status: DC | PRN
  Filled 2015-08-17: qty 5

## 2015-08-17 MED ORDER — LACTATED RINGERS IV SOLN
INTRAVENOUS | Status: DC | PRN
  Administered 2015-08-17 (×2): via INTRAVENOUS

## 2015-08-17 MED ORDER — DEXAMETHASONE 4 MG PO TABS
4.0000 mg | ORAL_TABLET | Freq: Four times a day (QID) | ORAL | Status: AC
  Administered 2015-08-17 (×3): 4 mg via ORAL
  Filled 2015-08-17 (×3): qty 1

## 2015-08-17 MED ORDER — MORPHINE SULFATE (PF) 2 MG/ML IV SOLN
1.0000 mg | INTRAVENOUS | Status: DC | PRN
  Administered 2015-08-17: 4 mg via INTRAVENOUS
  Filled 2015-08-17: qty 2

## 2015-08-17 MED ORDER — SURGIFOAM 100 EX MISC
CUTANEOUS | Status: DC | PRN
  Administered 2015-08-17: 20000 mL via TOPICAL

## 2015-08-17 MED ORDER — MEPERIDINE HCL 25 MG/ML IJ SOLN: 6.2500 mg | INTRAMUSCULAR | Status: DC | PRN

## 2015-08-17 MED ORDER — MIDAZOLAM HCL 5 MG/5ML IJ SOLN
INTRAMUSCULAR | Status: DC | PRN
  Administered 2015-08-17: 2 mg via INTRAVENOUS

## 2015-08-17 MED ORDER — ALBUTEROL SULFATE HFA 108 (90 BASE) MCG/ACT IN AERS
INHALATION_SPRAY | RESPIRATORY_TRACT | Status: DC | PRN
  Administered 2015-08-17 (×2): 2 via RESPIRATORY_TRACT

## 2015-08-17 MED ORDER — PHENOL 1.4 % MT LIQD
1.0000 | OROMUCOSAL | Status: DC | PRN
  Filled 2015-08-17: qty 177

## 2015-08-17 MED ORDER — FENTANYL CITRATE (PF) 250 MCG/5ML IJ SOLN
INTRAMUSCULAR | Status: DC | PRN
  Administered 2015-08-17 (×11): 50 ug via INTRAVENOUS

## 2015-08-17 MED ORDER — PROMETHAZINE HCL 25 MG/ML IJ SOLN: 6.2500 mg | INTRAMUSCULAR | Status: DC | PRN

## 2015-08-17 MED ORDER — ONDANSETRON HCL 4 MG/2ML IJ SOLN: 4.0000 mg | INTRAMUSCULAR | Status: DC | PRN

## 2015-08-17 MED ORDER — SUCCINYLCHOLINE CHLORIDE 20 MG/ML IJ SOLN
INTRAMUSCULAR | Status: AC
  Filled 2015-08-17: qty 1

## 2015-08-17 MED ORDER — NEOSTIGMINE METHYLSULFATE 10 MG/10ML IV SOLN
INTRAVENOUS | Status: DC | PRN
  Administered 2015-08-17: 4 mg via INTRAVENOUS

## 2015-08-17 MED ORDER — HEMOSTATIC AGENTS (NO CHARGE) OPTIME
TOPICAL | Status: DC | PRN
  Administered 2015-08-17: 1 via TOPICAL

## 2015-08-17 SURGICAL SUPPLY — 64 items
BLADE SURG ROTATE 9660 (MISCELLANEOUS) IMPLANT
BUR EGG ELITE 4.0 (BURR) IMPLANT
BUR MATCHSTICK NEURO 3.0 LAGG (BURR) IMPLANT
CANISTER SUCTION 2500CC (MISCELLANEOUS) ×2 IMPLANT
CLSR STERI-STRIP ANTIMIC 1/2X4 (GAUZE/BANDAGES/DRESSINGS) ×2 IMPLANT
CORDS BIPOLAR (ELECTRODE) ×2 IMPLANT
COVER SURGICAL LIGHT HANDLE (MISCELLANEOUS) ×3 IMPLANT
CRADLE DONUT ADULT HEAD (MISCELLANEOUS) ×2 IMPLANT
DEVICE ENDSKLTN TC MED 8MM (Orthopedic Implant) IMPLANT
DRAPE C-ARM 42X72 X-RAY (DRAPES) ×2 IMPLANT
DRAPE POUCH INSTRU U-SHP 10X18 (DRAPES) ×2 IMPLANT
DRAPE SURG 17X23 STRL (DRAPES) ×2 IMPLANT
DRAPE U-SHAPE 47X51 STRL (DRAPES) ×2 IMPLANT
DRSG MEPILEX BORDER 4X4 (GAUZE/BANDAGES/DRESSINGS) ×2 IMPLANT
DURAPREP 26ML APPLICATOR (WOUND CARE) ×2 IMPLANT
ELECT COATED BLADE 2.86 ST (ELECTRODE) ×2 IMPLANT
ELECT PENCIL ROCKER SW 15FT (MISCELLANEOUS) ×2 IMPLANT
ELECT REM PT RETURN 9FT ADLT (ELECTROSURGICAL) ×2
ELECTRODE REM PT RTRN 9FT ADLT (ELECTROSURGICAL) ×1 IMPLANT
ENDOSKELTON TC IMPLANT 8MM MED (Orthopedic Implant) ×4 IMPLANT
GLOVE BIOGEL PI IND STRL 8 (GLOVE) ×1 IMPLANT
GLOVE BIOGEL PI IND STRL 8.5 (GLOVE) ×1 IMPLANT
GLOVE BIOGEL PI INDICATOR 8 (GLOVE) ×1
GLOVE BIOGEL PI INDICATOR 8.5 (GLOVE) ×1
GLOVE ORTHO TXT STRL SZ7.5 (GLOVE) ×2 IMPLANT
GLOVE SS BIOGEL STRL SZ 8.5 (GLOVE) ×1 IMPLANT
GLOVE SUPERSENSE BIOGEL SZ 8.5 (GLOVE) ×1
GOWN STRL REUS W/ TWL XL LVL3 (GOWN DISPOSABLE) ×2 IMPLANT
GOWN STRL REUS W/TWL 2XL LVL3 (GOWN DISPOSABLE) ×4 IMPLANT
GOWN STRL REUS W/TWL XL LVL3 (GOWN DISPOSABLE) ×4
KIT BASIN OR (CUSTOM PROCEDURE TRAY) ×2 IMPLANT
KIT ROOM TURNOVER OR (KITS) ×2 IMPLANT
NDL SPNL 18GX3.5 QUINCKE PK (NEEDLE) ×1 IMPLANT
NEEDLE SPNL 18GX3.5 QUINCKE PK (NEEDLE) ×2 IMPLANT
NS IRRIG 1000ML POUR BTL (IV SOLUTION) ×2 IMPLANT
PACK ORTHO CERVICAL (CUSTOM PROCEDURE TRAY) ×2 IMPLANT
PACK UNIVERSAL I (CUSTOM PROCEDURE TRAY) ×2 IMPLANT
PAD ARMBOARD 7.5X6 YLW CONV (MISCELLANEOUS) ×4 IMPLANT
PATTIES SURGICAL .25X.25 (GAUZE/BANDAGES/DRESSINGS) ×2 IMPLANT
PIN DISTRACTION 14 (PIN) ×1 IMPLANT
PIN RETAINER PRODISC 14 MM (PIN) ×1 IMPLANT
PLATE SKYLINE 2 LEVEL 34MM (Plate) ×1 IMPLANT
PUTTY BONE DBX 5CC MIX (Putty) ×1 IMPLANT
RESTRAINT LIMB HOLDER UNIV (RESTRAINTS) ×2 IMPLANT
SCREW SKYLINE 14MM SD-VA (Screw) ×4 IMPLANT
SCREW SKYLINE 16MM (Screw) ×2 IMPLANT
SPONGE INTESTINAL PEANUT (DISPOSABLE) ×3 IMPLANT
SPONGE LAP 4X18 X RAY DECT (DISPOSABLE) ×4 IMPLANT
SPONGE SURGIFOAM ABS GEL 100 (HEMOSTASIS) ×2 IMPLANT
STRIP CLOSURE SKIN 1/2X4 (GAUZE/BANDAGES/DRESSINGS) ×1 IMPLANT
SURGIFLO W/THROMBIN 8M KIT (HEMOSTASIS) ×1 IMPLANT
SUT BONE WAX W31G (SUTURE) ×2 IMPLANT
SUT MON AB 3-0 SH 27 (SUTURE) ×2
SUT MON AB 3-0 SH27 (SUTURE) ×1 IMPLANT
SUT SILK 2 0 (SUTURE)
SUT SILK 2-0 18XBRD TIE 12 (SUTURE) IMPLANT
SUT VIC AB 2-0 CT1 18 (SUTURE) ×2 IMPLANT
SYR BULB IRRIGATION 50ML (SYRINGE) ×2 IMPLANT
SYR CONTROL 10ML LL (SYRINGE) ×2 IMPLANT
TAPE CLOTH 4X10 WHT NS (GAUZE/BANDAGES/DRESSINGS) ×2 IMPLANT
TAPE UMBILICAL COTTON 1/8X30 (MISCELLANEOUS) ×2 IMPLANT
TOWEL OR 17X24 6PK STRL BLUE (TOWEL DISPOSABLE) ×2 IMPLANT
TOWEL OR 17X26 10 PK STRL BLUE (TOWEL DISPOSABLE) ×2 IMPLANT
WATER STERILE IRR 1000ML POUR (IV SOLUTION) ×1 IMPLANT

## 2015-08-17 NOTE — Brief Op Note (Signed)
08/17/2015  11:26 AM  PATIENT:  [name]  49 y.o. male  PRE-OPERATIVE DIAGNOSIS:  cervical spondylitic radiculopathy  POST-OPERATIVE DIAGNOSIS:  cervical spondylitic radiculopathy  PROCEDURE:  Procedure(s): ANTERIOR CERVICAL DECOMPRESSION/DISCECTOMY FUSION Cervical five-cervical seven. Two LEVELS (N/A)  SURGEON:  Surgeon(s) and Role:    * Melina Schools, MD - Primary  PHYSICIAN ASSISTANT:   ASSISTANTS: Carmen Mayo   ANESTHESIA:   general  EBL:  Total I/O In: 1500 [I.V.:1500] Out: -   BLOOD ADMINISTERED:none  DRAINS: none   LOCAL MEDICATIONS USED:  MARCAINE     SPECIMEN:  No Specimen  DISPOSITION OF SPECIMEN:  N/A  COUNTS:  YES  TOURNIQUET:  * No tourniquets in log *  DICTATION: .Other Dictation: Dictation Number [id number]  PLAN OF CARE: Admit for overnight observation  PATIENT DISPOSITION:  PACU - hemodynamically stable.

## 2015-08-17 NOTE — Discharge Instructions (Signed)

## 2015-08-17 NOTE — Op Note (Signed)
NAMELONALD, LUMPKIN                ACCOUNT NO.:  [id number]  MEDICAL RECORD NO.:  [id number]  LOCATION:  [facility]                         FACILITY:  [place]  PHYSICIAN:  Malala Trenkamp D. Rolena Infante, M.D. 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  OPERATIVE PROCEDURE:  Anterior cervical diskectomy and fusion C5-6, C6- 7.  COMPLICATIONS:  None.  INSTRUMENTATION:  System used was a Titan titanium intervertebral spacers size 8 medium lordotic cages packed with DBX mix with a 34 mm anterior cervical DePuy Skyline plate affixed with 16 mm screws into the body of C5, 14 mm screws into the bodies of 6 and 7.  COMPLICATIONS:  No complications.  FIRST ASSISTANT:  Ronette Deter, [place].  HISTORY:  This is a very pleasant 49 year old gentleman who has been having progressive debilitating neuropathic left arm pain and neck pain. Attempts at conservative management have failed to alleviate symptoms, so we elected to proceed with surgery.  All appropriate risks, benefits, and alternatives were discussed with the patient and consent was obtained.  OPERATIVE NOTE:  The patient was brought to the operating room, placed supine on the operating table.  After successful induction of general anesthesia and endotracheal intubation, TEDs, SCDs were applied.  He was moved over to the surgical table.  Inflatable bag was placed between the shoulder blades and the arms were secured down at the side and the anterior cervical spine was prepped and draped in standard fashion. Time-out was then taken to confirm the  patient, procedure, and all other pertinent important data.  Once this was completed, I identified the cricoid cartilage in the C6 vertebral body.  I mapped out my incision site and infiltrated with 0.25% Marcaine.  I made a transverse incision starting at the midline proceeding to the left.  Sharp dissection was carried out down to the platysma.  Platysma was sharply incised in line with the skin incision and I continued to sharply dissect along the medial border of the sternocleidomastoid proceeding through the deep cervical and prevertebral fascia.  This is a standard Smith-Robinson approach to the anterior cervical spine.  I identified the omohyoid, released it from its sling, but did not sacrifice.  I now retracted and protected the esophagus on the right side and identified and protected the carotid sheath with a finger on the left.  Using my Engineering geologist, I removed the remaining prevertebral fascia to expose the ALL.  I placed a needle into the C5-6 disk space and took an intraoperative x-ray.  I confirmed I was at the appropriate level.  Once this was done, I used bipolar electrocautery to mobilize the longus colli muscles from the midbody of C5 to the midbody of C7 bilaterally.  Self-retaining retractors were placed underneath the longus colli muscle.  The endotracheal cuff was deflated.  I expanded  the retractors and then reinflated the cuff.  I incised this C6-7 anulus and then used a combination of pituitary rongeurs, and curettes to remove all of the disk material.  I then placed distraction pins into the body of 6 and the body of 5 and distracted the space.  I continued my posterior dissection using my fine nerve hook to develop a plane between the PLL and the thecal sac.  Using the 1 mm Kerrison rongeur, I resected the PLL.  I then undercut the uncovertebral joint bilaterally, but especially on the left as this was the more painful of the 2 radicular side.  Once I had  an adequate decompression and diskectomy and foraminal decompression, I rasped the endplates until I had bleeding subchondral bone.  I trialed intervertebral spacers and elected to use a size 8 Titan cage packed with DBX mix.  This had excellent purchase and fit.  I then repositioned my retractors to expose the C5-6 disk space.  Using the exact same technique, I removed all of the disk material at this level.  Rasped the endplates, placed the same size implant.  At this point with the diskectomy and fusions complete, I then placed an anterior cervical  plate and secured it with the appropriate self- drilling screws.  All screws had excellent purchase.  I then applied the final locking mechanism as prescribed by the manufacturer.  At this point, the ACDF was complete.  I then tracked the esophagus to ensure that it did not become entrapped beneath the plate, which I copiously irrigated the wound with normal saline, and to ensure I had hemostasis. Bipolar electrocautery and Floseal was used to obtain and maintain hemostasis.  I then returned the trachea and esophagus back to midline and then closed the platysma with interrupted 2-0 Vicryl sutures and a 3- 0 Monocryl for the skin.  Steri-strips and dry dressing were applied and the patient was ultimately extubated, transferred to PACU without incident.     Dmario Russom D. Rolena Infante, M.D.     DDB/MEDQ  D:  08/17/2015  T:  08/17/2015  Job:  [id number]

## 2015-08-17 NOTE — Evaluation (Signed)
Physical Therapy Evaluation and discharge Patient Details Name: [name] MRN: [id number] DOB: 10-20-1965 Today's Date: 08/17/2015   History of Present Illness  Pt s/p elective ACDF C5-7 11/23.  Clinical Impression  Patient is s/p above surgery resulting in the deficits listed below (see PT Problem List). Pt functioning at supervision level and will have 24/7 assist at home. Patient will benefit from skilled PT to increase their independence and safety with mobility (while adhering to their precautions) to allow discharge to the venue listed below. nPt with no further acute PT needs at this time. PT SIGNING OFF.     Follow Up Recommendations No PT follow up;Supervision - Intermittent    Equipment Recommendations  None recommended by PT    Recommendations for Other Services       Precautions / Restrictions Precautions Precautions: None Precaution Comments: handout provided pt and spouse with good understanding of precautions Restrictions Weight Bearing Restrictions: No      Mobility  Bed Mobility Overal bed mobility: Needs Assistance Bed Mobility: Rolling;Sidelying to Sit;Sit to Sidelying Rolling: Supervision Sidelying to sit: Supervision     Sit to sidelying: Supervision General bed mobility comments: v/c's for technique, pt demod good technique  Transfers Overall transfer level: Needs assistance Equipment used: None Transfers: Sit to/from Stand Sit to Stand: Supervision         General transfer comment: good technique  Ambulation/Gait Ambulation/Gait assistance: Supervision Ambulation Distance (Feet): 200 Feet Assistive device: None Gait Pattern/deviations: WFL(Within Functional Limits)     General Gait Details: no episodes of LOB  Stairs Stairs: Yes Stairs assistance: Min guard Stair Management: One rail Right;Step to pattern Number of Stairs: 5 General stair comments: no episodes of LOB  Wheelchair Mobility    Modified Rankin (Stroke  Patients Only)       Balance Overall balance assessment: No apparent balance deficits (not formally assessed)                                           Pertinent Vitals/Pain Pain Assessment: 0-10 Pain Score: 10-Worst pain ever Pain Location: surgical site Pain Descriptors / Indicators: Sore Pain Intervention(s): Premedicated before session    Home Living Family/patient expects to be discharged to:: Private residence Living Arrangements: Spouse/significant other Available Help at Discharge: Family;Available 24 hours/day Type of Home: House Home Access: Stairs to enter Entrance Stairs-Rails: Psychiatric nurse of Steps: 3 Home Layout: One level Home Equipment: None      Prior Function Level of Independence: Independent         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     Hand Dominance        Extremity/Trunk Assessment   Upper Extremity Assessment: Overall WFL for tasks assessed           Lower Extremity Assessment: Overall WFL for tasks assessed      Cervical / Trunk Assessment:  (recent surgery)  Communication   Communication: No difficulties  Cognition Arousal/Alertness: Awake/alert Behavior During Therapy: WFL for tasks assessed/performed Overall Cognitive Status: Within Functional Limits for tasks assessed                      General Comments      Exercises        Assessment/Plan    PT Assessment Patent does not need any further PT services  PT Diagnosis Acute pain  PT Problem List    PT Treatment Interventions     PT Goals (Current goals can be found in the Care Plan section) Acute Rehab PT Goals Patient Stated Goal: home PT Goal Formulation: All assessment and education complete, DC therapy    Frequency     Barriers to discharge        Co-evaluation               End of Session Equipment Utilized During Treatment: Cervical collar Activity Tolerance: Patient tolerated treatment  well Patient left: in bed;with call bell/phone within reach;with family/visitor present Nurse Communication: Mobility status    Functional Assessment Tool Used: clinical judgement Functional Limitation: Mobility: Walking and moving around Mobility: Walking and Moving Around Current Status [id number]): At least 1 percent but less than 20 percent impaired, limited or restricted Mobility: Walking and Moving Around Goal Status [phone]): At least 1 percent but less than 20 percent impaired, limited or restricted Mobility: Walking and Moving Around Discharge Status [phone]): At least 1 percent but less than 20 percent impaired, limited or restricted    Time: [id number] PT Time Calculation (min) (ACUTE ONLY): 21 min   Charges:   PT Evaluation $Initial PT Evaluation Tier I: 1 Procedure     PT G Codes:   PT G-Codes **NOT FOR INPATIENT CLASS** Functional Assessment Tool Used: clinical judgement Functional Limitation: Mobility: Walking and moving around Mobility: Walking and Moving Around Current Status [id number]): At least 1 percent but less than 20 percent impaired, limited or restricted Mobility: Walking and Moving Around Goal Status [phone]): At least 1 percent but less than 20 percent impaired, limited or restricted Mobility: Walking and Moving Around Discharge Status [phone]): At least 1 percent but less than 20 percent impaired, limited or restricted    Kingsley Callander 08/17/2015, 4:24 PM  Kittie Plater, PT, DPT Pager #: [phone] Office #: [phone]

## 2015-08-17 NOTE — H&P (Signed)
History of Present Illness  The patient is a 49 year old male who comes in today for a preoperative History and Physical. The patient is scheduled for a ACDF C5-7 to be performed by Dr. Duane Lope D. Rolena Infante, MD at [facility] on 08-17-15 . Please see the hospital record for complete dictated history and physical. He reports severe neck pain and b/l arm pain and numbness L>R. The pt smokes less than a pack a day. We discussed complete cessation the pt is going down to PT to get a Aspen collar and Bone stim. He understands to bring to collar to the hospital.  Allergies No Known Drug Allergies08/26/2016  Family History  Cancer Mother. Cerebrovascular Accident Maternal Grandfather. Chronic Obstructive Lung Disease Father. Depression Father. Diabetes Mellitus Father, Paternal Jon Gills, Paternal Grandmother. First Degree Relatives reported Heart Disease Maternal Grandfather. Heart disease in male family member before age 34 Hypertension Father, Maternal Grandfather.  Social History  Marital status married Number of flights of stairs before winded less than 1 Living situation live with spouse No history of drug/alcohol rehab Not under pain contract Tobacco / smoke exposure 05/20/2015: no Children 1 Tobacco use Current every day smoker. 05/20/2015: smoke(d) 1 pack(s) per day Exercise Exercises never; does other Former drinker 05/20/2015: In the past drank Current work status working full time  Medication History  ProAir HFA (108 (90 Base)MCG/ACT Aerosol Soln, Inhalation) Active. (prn) ALPRAZolam (1MG  Tablet, Oral) Active. (tid) Benadryl (25MG  Capsule, 1 (one) Oral) Active. (qhs) Lisinopril (20MG  Tablet, Oral) Active. (qd) Oxycodone-Acetaminophen (5-325MG  Tablet, Oral) Active. (tid Rx'd by PCP) Pantoprazole Sodium (40MG  Tablet DR, Oral) Active. (bid) QUEtiapine Fumarate (300MG  Tablet, Oral) Active. (seraquil #2 qhs) Sertraline HCl (100MG  Tablet,  Oral) Active. (qd) Simvastatin (40MG  Tablet, Oral) Active. (qd) Tamsulosin HCl (0.4MG  Capsule, Oral) Active. (#2 qd) Zolpidem Tartrate (10MG  Tablet, Oral) Active. (qhs) AndroGel Pump (20.25 MG/ACT(1.62%) Gel, Transdermal) Active. 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 Orientation-Oriented X3. Build & Nutrition-Well nourished and Well developed.  Integumentary General Characteristics Surgical Scars - no surgical scar evidence of previous cervical surgery. Cervical Spine-Skin examination of the cervical spine is without deformity, skin lesions, lacerations or abrasions.  Chest and Lung Exam Auscultation Breath sounds - Normal and Clear.  Abdomen Palpation/Percussion Palpation and Percussion of the abdomen reveal - Soft and Non Tender.  Peripheral Vascular Upper Extremity Palpation - Radial pulse - Bilateral - 2+.  Neurologic Sensation Upper Extremity - Bilateral - sensation is diminished in the upper extremity. Reflexes Biceps Reflex - Bilateral - 2+. Brachioradialis Reflex - Bilateral - 2+. Triceps Reflex - Bilateral - 2+. Hoffman's Sign - Bilateral - Hoffman's sign not present.  Musculoskeletal Spine/Ribs/Pelvis  Cervical Spine : Inspection and Palpation - Tenderness - right upper trapezius area  tender to palpation, left upper trapezius area tender to palpation, right cervical paraspinals tender to palpation, left cervical paraspinals tender to palpation, left deltoid tender to palpation and right deltoid tender to palpation. Strength and Tone: Strength - Deltoid - Bilateral - 5/5.  Biceps - Bilateral - 5/5. Triceps - Bilateral - 5/5. Wrist Extension - Bilateral - 5/5. Hand Grip - Bilateral - 5/5. Heel walk - Bilateral - able to heel walk without difficulty. Toe Walk - Bilateral - able to walk on toes without difficulty. Heel-Toe Walk - Bilateral - able to heel-toe walk without difficulty. ROM - Flexion - Full and painful. Extension - Moderately Decreased and painful. Left Lateral Flexion - Moderately Decreased and painful. Right Lateral Flexion - Moderately Decreased and painful. Left Rotation - Moderately Decreased and painful. Right Rotation - Moderately Decreased and painful. Pain - extension is more painful than flexion. Cervical Spine - Special Testing - axial compression test negative, cross chest impingement test negative. Non-Anatomic Signs - No non-anatomic signs present. Upper Extremity Range of Motion - No truesholder pain with IR/ER of the shoulders. Note: positive Lhermitte sign on the left  RADIOGRAPHS Oblique radiographs obtained today demonstrates neural foraminal stenosis secondary to facet hypertrophy bilaterally, left greater than right at C6-C7.  MRI demonstrates severe stenosis, C6-C7 and a moderate osteophyte disc protrusion, C5-C6. Facet arthrosis noted as well with moderate neural foraminal narrowing.   We have taken a long time to discuss surgery and I do believe a two level ACDF is probably his best option. We have talked about how the nicotine will adversely affect his healing and because this is a multilevel procedure, and so I am going to keep him in an Aspen collar for six weeks after surgery instead of two and we are going to use an external bone stimulator for  about nine months to improve the overall fusion rate. All of his questions were addressed. We reviewed the risks which include infection, bleeding, nerve damage, death, stroke, paralysis, failure to heal, need for further surgery, ongoing or worse pain, loss of bowel and bladder control, throat pain, swallowing difficulties, hoarseness in the voice, need for further surgery. All of his questions were addressed. We will plan on moving forward in a very timely fashion.  Anterior cervical fusion:Risks of surgery include, but are not limited to: Throat pain, swallowing difficulty, hoarseness or change in voice, death, stroke, paralysis, nerve root damage/injury, bleeding, blood clots, loss of bowel/bladder control, hardware failure, or mal-position, spinal fluid leak, adjacent segment disease, non-union, need for further surgery, ongoing or worse pain, infection. Post-operative bleeding or swelling that could require emergent surgery.  Goal Of Surgery:Discussed that goal of surgery is to reduce pain and improve function and quality of life. Patient is aware that despite all appropriate treatment that there pain and function could be the same, worse, or different.

## 2015-08-17 NOTE — Anesthesia Postprocedure Evaluation (Signed)
Anesthesia Post Note  Patient: [name]  Procedure(s) Performed: Procedure(s) (LRB): ANTERIOR CERVICAL DECOMPRESSION/DISCECTOMY FUSION Cervical five-cervical seven. Two LEVELS (N/A)  Patient location during evaluation: PACU Anesthesia Type: General Level of consciousness: awake and alert Pain management: pain level controlled Vital Signs Assessment: post-procedure vital signs reviewed and stable Respiratory status: spontaneous breathing, nonlabored ventilation, respiratory function stable and patient connected to nasal cannula oxygen Cardiovascular status: blood pressure returned to baseline and stable Postop Assessment: No signs of nausea or vomiting Anesthetic complications: no    Last Vitals:  Filed Vitals:   08/17/15 1230 08/17/15 1248  BP:  137/90  Pulse: 98 100  Temp: 36.9 C 36.7 C  Resp: 17 16    Last Pain:  Filed Vitals:   08/17/15 1313  PainSc: 8       LLE Sensation: Full sensation   RLE Sensation: Full sensation      Effie Berkshire

## 2015-08-17 NOTE — Anesthesia Procedure Notes (Signed)
Procedure Name: Intubation Date/Time: 08/17/2015 8:41 AM Performed by: Merrilyn Puma B Pre-anesthesia Checklist: Patient identified, Timeout performed, Emergency Drugs available, Suction available and Patient being monitored Patient Re-evaluated:Patient Re-evaluated prior to inductionOxygen Delivery Method: Circle system utilized Preoxygenation: Pre-oxygenation with 100% oxygen Intubation Type: IV induction Ventilation: Mask ventilation without difficulty Grade View: Grade I Tube type: Oral Tube size: 7.5 mm Number of attempts: 1 Airway Equipment and Method: Stylet,  Video-laryngoscopy and LTA kit utilized Placement Confirmation: CO2 detector,  positive ETCO2,  ETT inserted through vocal cords under direct vision and breath sounds checked- equal and bilateral Secured at: 22 cm Tube secured with: Tape Dental Injury: Teeth and Oropharynx as per pre-operative assessment

## 2015-08-17 NOTE — Transfer of Care (Signed)
Immediate Anesthesia Transfer of Care Note  Patient: [name]  Procedure(s) Performed: Procedure(s): ANTERIOR CERVICAL DECOMPRESSION/DISCECTOMY FUSION Cervical five-cervical seven. Two LEVELS (N/A)  Patient Location: PACU  Anesthesia Type:General  Level of Consciousness: awake, alert , oriented and patient cooperative  Airway & Oxygen Therapy: Patient Spontanous Breathing and Patient connected to nasal cannula oxygen  Post-op Assessment: Report given to RN and Post -op Vital signs reviewed and stable  Post vital signs: Reviewed and stable  Last Vitals:  Filed Vitals:   08/17/15 0654  BP: 123/83  Pulse: 91  Temp: 36.3 C  Resp: 18    Complications: No apparent anesthesia complications

## 2015-08-18 DIAGNOSIS — M4802 Spinal stenosis, cervical region: Secondary | ICD-10-CM | POA: Diagnosis not present

## 2015-08-18 NOTE — Progress Notes (Signed)
Pt given D/C instructions with Rx's, verbal understanding was provided. Pt's IV was removed prior to D/C. Pt's incision is clean and dry with no sign of infection. Pt D/C'd home via wheelchair @1015 per MD order. Pt is stable @ D/C and has no other needs at this time. Alizah Sills, RN  

## 2015-08-18 NOTE — Progress Notes (Signed)
Occupational Therapy Evaluation Patient Details Name: [name] MRN: [id number] DOB: 21-May-1966 Today's Date: 08/18/2015    History of Present Illness Pt s/p elective ACDF C5-7 11/23.   Clinical Impression   Completed all education regarding cervical precautions and compensatory techniques for  ADL. Pt demonstrated understanding. Pt ready for D/C when medically stable.     Follow Up Recommendations  No OT follow up;Supervision - Intermittent    Equipment Recommendations  None recommended by OT    Recommendations for Other Services       Precautions / Restrictions Precautions Precautions: None Precaution Comments: handout provided pt and spouse with good understanding of precautions Restrictions Weight Bearing Restrictions: No      Mobility Bed Mobility Overal bed mobility: Needs Assistance Bed Mobility: Rolling;Sidelying to Sit;Sit to Sidelying Rolling: Supervision Sidelying to sit: Supervision     Sit to sidelying: Supervision General bed mobility comments: v/c's for technique, pt demod good technique  Transfers Overall transfer level: Modified independent                    Balance Overall balance assessment: No apparent balance deficits (not formally assessed)                                          ADL                                         General ADL Comments: Completed education regarding cervical precautions and compensatory techniques for ADL and functional mobility for ADL. Written handout reviewed.  Educatedon use of reacher to retrieve items from floor. Educatedon techique to [facility].doff cervical collar. Pt abel to return demonstrate.      Vision     Perception     Praxis      Pertinent Vitals/Pain Pain Assessment: 0-10 Pain Score: 6  Pain Location: neck/throat Pain Descriptors / Indicators: Guarding;Sore;Squeezing Pain Intervention(s): Limited activity within patient's tolerance;RN gave  pain meds during session     Hand Dominance     Extremity/Trunk Assessment Upper Extremity Assessment Upper Extremity Assessment: Overall WFL for tasks assessed   Lower Extremity Assessment Lower Extremity Assessment: Overall WFL for tasks assessed   Cervical / Trunk Assessment Cervical / Trunk Assessment: Other exceptions (recent surgery)   Communication Communication Communication: No difficulties   Cognition Arousal/Alertness: Awake/alert Behavior During Therapy: WFL for tasks assessed/performed Overall Cognitive Status: Within Functional Limits for tasks assessed                     General Comments       Exercises       Shoulder Instructions      Home Living Family/patient expects to be discharged to:: Private residence Living Arrangements: Spouse/significant other Available Help at Discharge: Family;Available 24 hours/day Type of Home: House Home Access: Stairs to enter [employer] of Steps: 3 Entrance Stairs-Rails: Right;Left Home Layout: One level     Bathroom Shower/Tub: Teacher, early years/pre: Standard Bathroom Accessibility: Yes How Accessible: Accessible via walker Home Equipment: None          Prior Functioning/Environment Level of Independence: Independent        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    OT Diagnosis: Generalized weakness;Acute pain   OT Problem  List: Decreased activity tolerance;Decreased range of motion;Decreased knowledge of use of DME or AE;Decreased knowledge of precautions;Pain   OT Treatment/Interventions:      OT Goals(Current goals can be found in the care plan section) Acute Rehab OT Goals Patient Stated Goal: home OT Goal Formulation: All assessment and education complete, DC therapy  OT Frequency:     Barriers to D/C:            Co-evaluation              End of Session Equipment Utilized During Treatment: Cervical collar Nurse Communication: Mobility status;Patient  requests pain meds  Activity Tolerance: Patient tolerated treatment well Patient left: in chair;with call bell/phone within reach   Time: 0820-0845 OT Time Calculation (min): 25 min Charges:  OT General Charges $OT Visit: 1 Procedure OT Evaluation $Initial OT Evaluation Tier I: 1 Procedure OT Treatments $Self Care/Home Management : 8-22 mins G-Codes: OT G-codes **NOT FOR INPATIENT CLASS** Functional Assessment Tool Used: clinical judgement Functional Limitation: Self care Self Care Current Status [id number]): At least 1 percent but less than 20 percent impaired, limited or restricted Self Care Goal Status [id number]): At least 1 percent but less than 20 percent impaired, limited or restricted Self Care Discharge Status [phone]): At least 1 percent but less than 20 percent impaired, limited or restricted  [facility] 08/18/2015, 9:19 AM   [facility], OTR/L  [phone] 08/18/2015

## 2015-08-18 NOTE — Progress Notes (Signed)
Subjective: 1 Day Post-Op Procedure(s) (LRB): ANTERIOR CERVICAL DECOMPRESSION/DISCECTOMY FUSION Cervical five-cervical seven. Two LEVELS (N/A) Patient reports pain as moderate.  Relieved with oral pain meds.  Urinating without difficulty.  TOlerating a regular diet.  OOB to walk with PT yesterday and this morning.  Objective: Vital signs in last 24 hours: Temp:  [97.5 F (36.4 C)-98.5 F (36.9 C)] 98.2 F (36.8 C) (11/24 0747) Pulse Rate:  [83-109] 101 (11/24 0747) Resp:  [10-20] 18 (11/24 0747) BP: (111-142)/(68-94) 124/73 mmHg (11/24 0747) SpO2:  [95 %-100 %] 95 % (11/24 0747)  Intake/Output from previous day: 11/23 0701 - 11/24 0700 In: 2275 [P.O.:720; I.V.:1500; IV Piggyback:55] Out: 240 [Urine:240] Intake/Output this shift:    No results for input(s): HGB in the last 72 hours. No results for input(s): WBC, RBC, HCT, PLT in the last 72 hours. No results for input(s): NA, K, CL, CO2, BUN, CREATININE, GLUCOSE, CALCIUM in the last 72 hours. No results for input(s): LABPT, INR in the last 72 hours.  PE:  wn wd male in nad.  C collar in place.  Incision dressed and dry.  B UEs with intact sens to LT.  5/5 strength throughout b UEs.  Assessment/Plan: 1 Day Post-Op Procedure(s) (LRB): ANTERIOR CERVICAL DECOMPRESSION/DISCECTOMY FUSION Cervical five-cervical seven. Two LEVELS (N/A) D/c home this morning.  Wylene Simmer 08/18/2015, 8:55 AM

## 2015-08-22 ENCOUNTER — Encounter (HOSPITAL_COMMUNITY): Payer: Self-pay | Admitting: Orthopedic Surgery

## 2015-08-22 ENCOUNTER — Other Ambulatory Visit: Payer: Self-pay | Admitting: Family Medicine

## 2015-08-22 NOTE — Telephone Encounter (Signed)
Last labs abnormal. pls advise 

## 2015-08-24 NOTE — Discharge Summary (Signed)
Physician Discharge Summary  Patient ID: [name] MRN: [id number] DOB/AGE: 1965/10/22 49 y.o.  Admit date: 08/17/2015 Discharge date: 08/24/2015  Admission Diagnoses:  Cervical disc disorder with radiculopathy  Discharge Diagnoses:  Active Problems:   Neck pain   Past Medical History  Diagnosis Date  . Hyperlipidemia   . Hypertension   . GERD (gastroesophageal reflux disease)   . Bipolar disorder ([place])   . Anxiety   . Sleep apnea     test 3/15-waiting results-was told oxygen did drop- did additional study done at home, told that he doesn't have sleep apnea   . H/O echocardiogram ?2015  . Shortness of breath dyspnea   . Pneumonia 2002    [place] admission   . Arthritis     degeneration of spine & scoliosis    Surgeries: Procedure(s): ANTERIOR CERVICAL DECOMPRESSION/DISCECTOMY FUSION Cervical five-cervical seven. Two LEVELS on 08/17/2015   Consultants (if any):    Discharged Condition: Improved  Hospital Course: [name] is an 49 y.o. male who was admitted 08/17/2015 with a diagnosis of Cervical disc disorder with radiculopathy and went to the operating room on 08/17/2015 and underwent the above named procedures.  Pt was discharged on 08/18/15.   He was given perioperative antibiotics:  Anti-infectives    Start     Dose/Rate Route Frequency Ordered Stop   08/17/15 1700  ceFAZolin (ANCEF) IVPB 1 g/50 mL premix     1 g 100 mL/hr over 30 Minutes Intravenous Every 8 hours 08/17/15 1253 08/18/15 0010   08/17/15 0800  ceFAZolin (ANCEF) IVPB 2 g/50 mL premix     2 g 100 mL/hr over 30 Minutes Intravenous To [facility] 08/16/15 1321 08/17/15 0845    .  He was given sequential compression devices, early ambulation, and TED for DVT prophylaxis.  He benefited maximally from the hospital stay and there were no complications.    Recent vital signs:  Filed Vitals:   08/18/15 0400 08/18/15 0747  BP: 116/68 124/73  Pulse: 90 101  Temp: 97.7 F (36.5  C) 98.2 F (36.8 C)  Resp: 20 18    Recent laboratory studies:  Lab Results  Component Value Date   HGB 15.2 08/09/2015   HGB 15.5 05/02/2015   HGB 16.3 01/03/2015   Lab Results  Component Value Date   WBC 7.9 08/09/2015   PLT 217 08/09/2015   No results found for: INR Lab Results  Component Value Date   NA 135 08/09/2015   K 4.5 08/09/2015   CL 104 08/09/2015   CO2 24 08/09/2015   BUN 12 08/09/2015   CREATININE 1.08 08/09/2015   GLUCOSE 112* 08/09/2015    Discharge Medications:     Medication List    STOP taking these medications        cyclobenzaprine 10 MG tablet  Commonly known as:  FLEXERIL     oxyCODONE-acetaminophen 5-325 MG tablet  Commonly known as:  PERCOCET/ROXICET  Replaced by:  oxyCODONE-acetaminophen 10-325 MG tablet     polyethylene glycol powder powder  Commonly known as:  GLYCOLAX/MIRALAX     simvastatin 40 MG tablet  Commonly known as:  ZOCOR     zolpidem 10 MG tablet  Commonly known as:  AMBIEN      TAKE these medications        albuterol 108 (90 BASE) MCG/ACT inhaler  Commonly known as:  PROAIR HFA  Inhale 1-2 puffs into the lungs every 6 (six) hours as needed for wheezing or shortness  of breath.     ALPRAZolam 1 MG tablet  Commonly known as:  XANAX  TAKE 1 TABLET BY MOUTH 3 TIMES A DAY AS NEEDED FOR ANXIETY.     diphenhydrAMINE 25 MG tablet  Commonly known as:  BENADRYL  Take 50 mg by mouth at bedtime.     lisinopril 20 MG tablet  Commonly known as:  PRINIVIL,ZESTRIL  TAKE 1 TABLET BY MOUTH DAILY.     methocarbamol 500 MG tablet  Commonly known as:  ROBAXIN  Take 1 tablet (500 mg total) by mouth 3 (three) times daily as needed for muscle spasms.     ondansetron 4 MG tablet  Commonly known as:  ZOFRAN  Take 1 tablet (4 mg total) by mouth every 8 (eight) hours as needed for nausea or vomiting.     oxyCODONE-acetaminophen 10-325 MG tablet  Commonly known as:  PERCOCET  Take 1 tablet by mouth every 4 (four) hours as  needed for pain.     oxymetazoline 0.05 % nasal spray  Commonly known as:  AFRIN  Place 2 sprays into both nostrils at bedtime. 2-3 sprays in each nostril     pantoprazole 40 MG tablet  Commonly known as:  PROTONIX  Take 1 tablet (40 mg total) by mouth daily.     QUEtiapine 300 MG tablet  Commonly known as:  SEROQUEL  Take 600 mg by mouth at bedtime.     sertraline 100 MG tablet  Commonly known as:  ZOLOFT  Take 100 mg by mouth daily before breakfast.     tamsulosin 0.4 MG Caps capsule  Commonly known as:  FLOMAX  Take 0.8 mg by mouth at bedtime.     Testosterone 20.25 MG/1.25GM (1.62%) Gel  Commonly known as:  ANDROGEL  Place 2 application onto the skin daily.        Diagnostic Studies: Dg Cervical Spine 2 Or 3 Views  08/17/2015  CLINICAL DATA:  Status post cervical discectomy EXAM: CERVICAL SPINE - 2-3 VIEW COMPARISON:  Intraoperative imaging 08/17/2015. FINDINGS: Changes of anterior fusion from C5-C7. C6 and C7 vertebral bodies are difficult to visualize due to overlying shoulders. No visible hardware complicating feature. IMPRESSION: C5-C7 ACDF. C6 and C7 difficult to visualize due to overlying shoulders. Electronically Signed   By: [name] M.D.   On: 08/17/2015 13:02   Dg Cervical Spine 2-3 Views  08/17/2015  CLINICAL DATA:  Operative imaging for anterior cervical disc fusion. EXAM: DG C-ARM 61-120 MIN; CERVICAL SPINE - 2-3 VIEW COMPARISON:  None. FINDINGS: Three submitted images show placement of an anterior fusion plate and fixation screws from C5 through C7. There are metal disc spacers maintaining disc height at diffuse levels, well centered. The orthopedic hardware is well-seated. There is no acute fracture or evidence of an operative complication. IMPRESSION: Operative imaging during C5-C7 anterior cervical disc fusion. Electronically Signed   By: [name] M.D.   On: 08/17/2015 11:30   Dg C-arm 61-120 Min  08/17/2015  CLINICAL DATA:  Operative imaging for  anterior cervical disc fusion. EXAM: DG C-ARM 61-120 MIN; CERVICAL SPINE - 2-3 VIEW COMPARISON:  None. FINDINGS: Three submitted images show placement of an anterior fusion plate and fixation screws from C5 through C7. There are metal disc spacers maintaining disc height at diffuse levels, well centered. The orthopedic hardware is well-seated. There is no acute fracture or evidence of an operative complication. IMPRESSION: Operative imaging during C5-C7 anterior cervical disc fusion. Electronically Signed   By: [name]  M.D.   On: 08/17/2015 11:30    Disposition: 01-Home or Self Care      Discharge Instructions    Call MD / Call 911    Complete by:  As directed   If you experience chest pain or shortness of breath, CALL 911 and be transported to the hospital emergency room.  If you develope a fever above 101 F, pus (white drainage) or increased drainage or redness at the wound, or calf pain, call your surgeon's office.     Constipation Prevention    Complete by:  As directed   Drink plenty of fluids.  Prune juice may be helpful.  You may use a stool softener, such as Colace (over the counter) 100 mg twice a day.  Use MiraLax (over the counter) for constipation as needed.     Diet - low sodium heart healthy    Complete by:  As directed      Increase activity slowly as tolerated    Complete by:  As directed            Follow-up Information    Follow up with Dahlia Bailiff, MD In 2 weeks.   Specialty:  Orthopedic Surgery   Why:  For suture removal, For wound re-check, If symptoms worsen   Contact information:   [address] [place] [address] [id number]        Signed: Valinda Hoar 08/24/2015, 1:41 PM

## 2015-09-08 ENCOUNTER — Other Ambulatory Visit: Payer: Self-pay | Admitting: Family Medicine

## 2015-09-08 NOTE — Telephone Encounter (Signed)
Rx called in to requested pharmacy 

## 2015-09-08 NOTE — Telephone Encounter (Signed)
Last f/u 12/2014 

## 2015-11-02 IMAGING — CR DG CHEST 2V
2 series · 2 of 2 positions shown · non-contrast
Comparison: 11/19/2003

CLINICAL DATA: Wheezing for 2 months.

EXAM:
CHEST  2 VIEW

[view not recorded (1 of 2)]
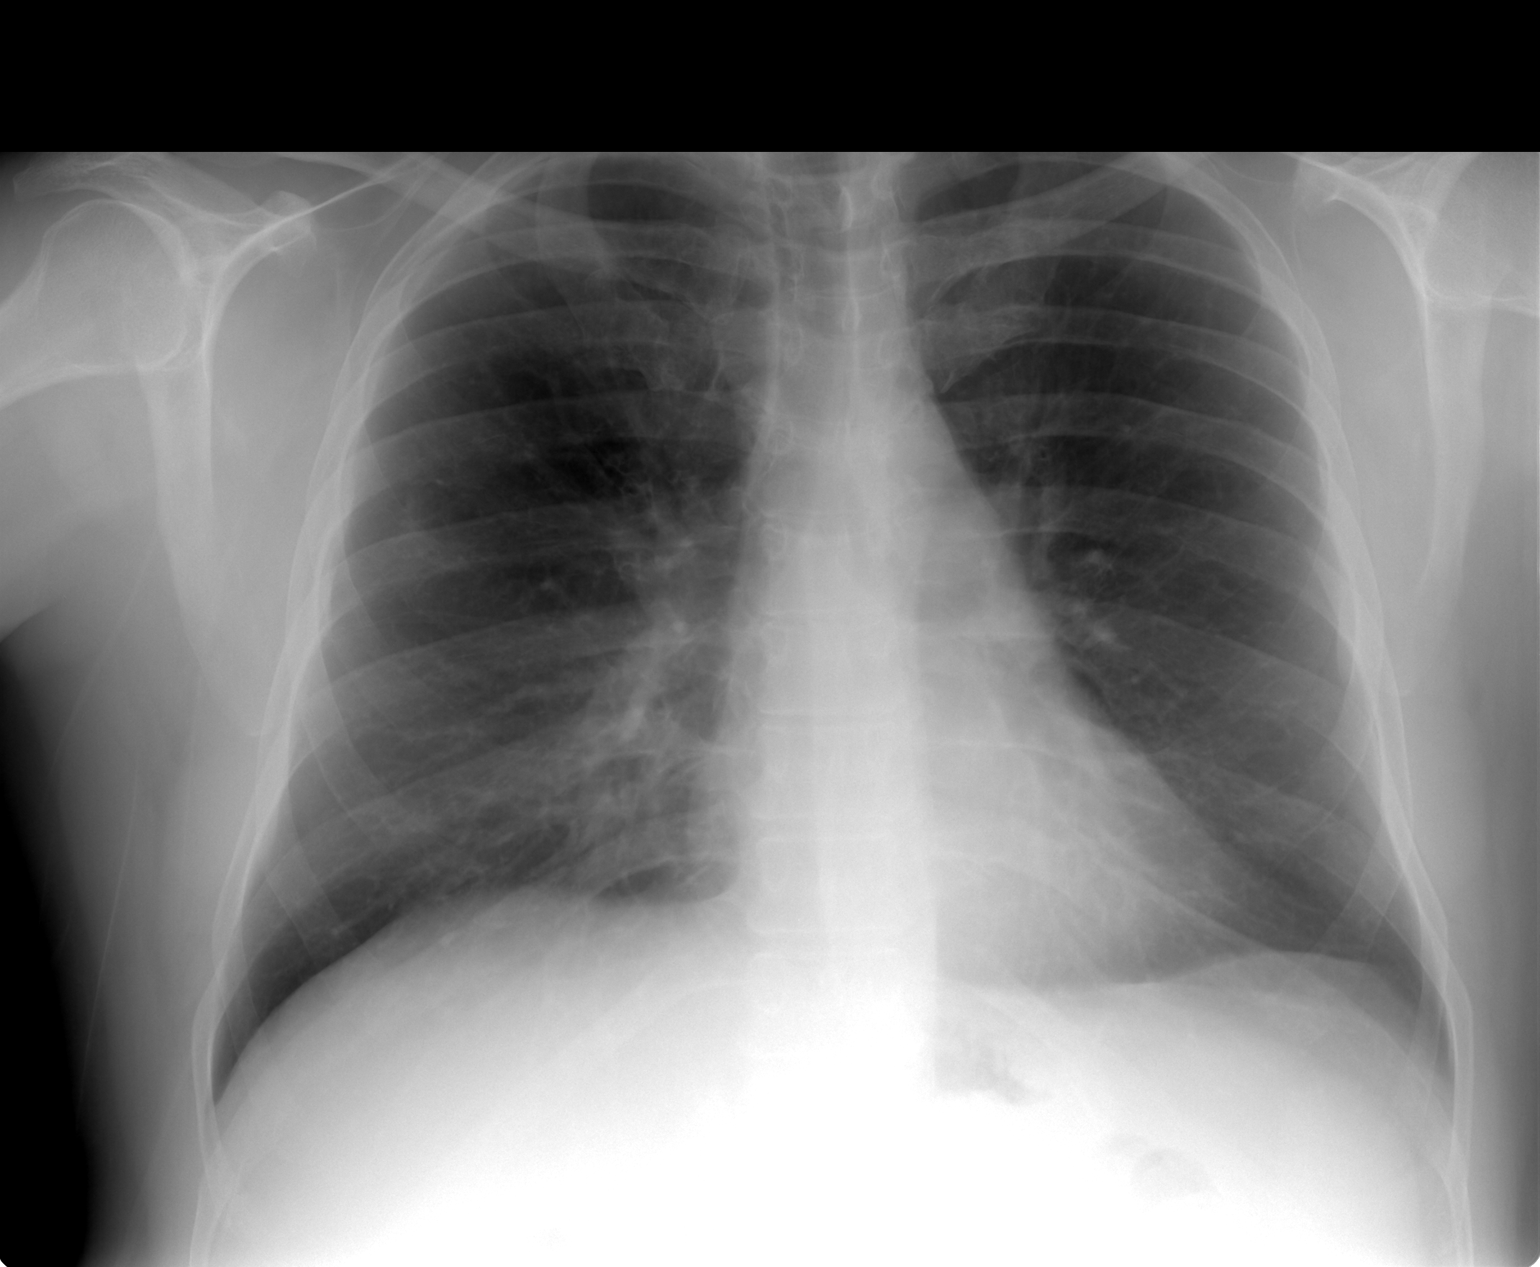

[view not recorded (2 of 2)]
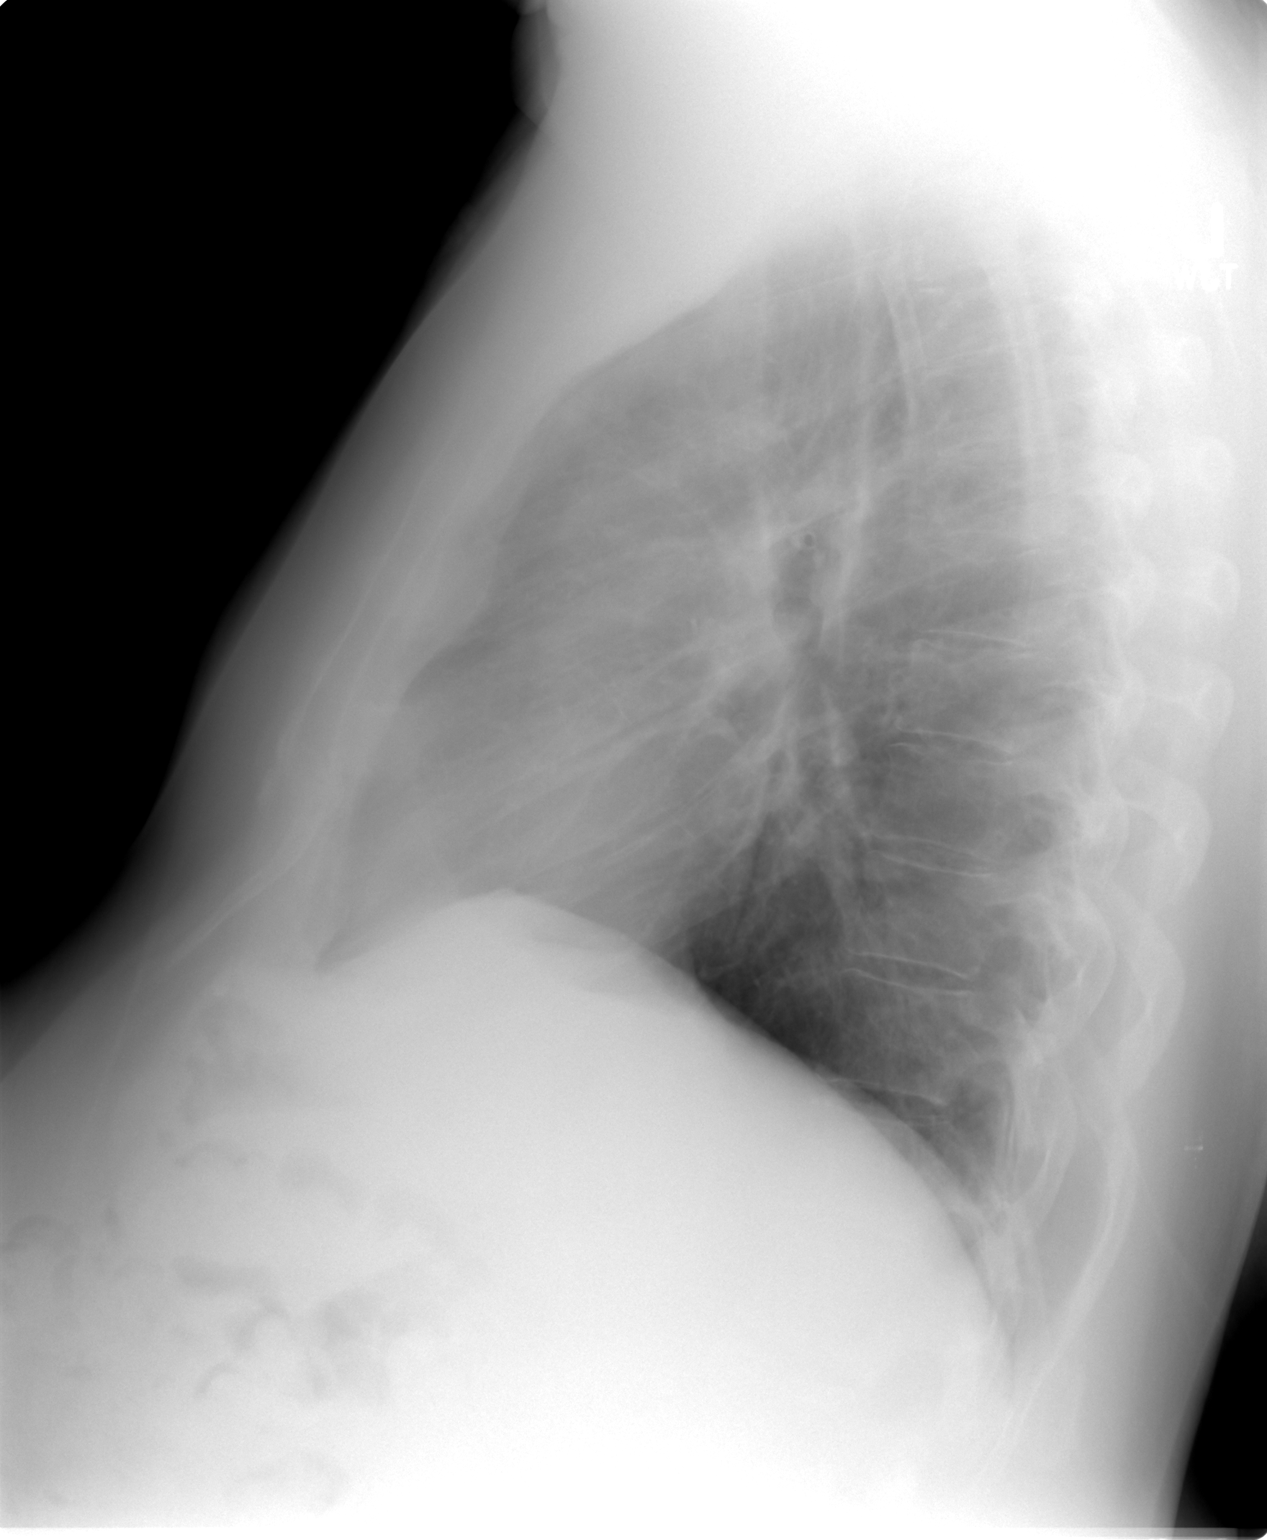

[2 of 2 positions shown; findings below may reference images not displayed]

FINDINGS: The cardiomediastinal silhouette is within normal limits. The lungs
are well inflated and clear. There is no evidence of pleural
effusion or pneumothorax. No acute osseous abnormality is
identified.
IMPRESSION: No active cardiopulmonary disease.

## 2015-12-31 DIAGNOSIS — M7541 Impingement syndrome of right shoulder: Secondary | ICD-10-CM | POA: Diagnosis not present

## 2016-02-11 DIAGNOSIS — Z9889 Other specified postprocedural states: Secondary | ICD-10-CM | POA: Diagnosis not present

## 2016-02-11 DIAGNOSIS — M5136 Other intervertebral disc degeneration, lumbar region: Secondary | ICD-10-CM | POA: Diagnosis not present

## 2016-04-13 DIAGNOSIS — Z9889 Other specified postprocedural states: Secondary | ICD-10-CM | POA: Diagnosis not present

## 2016-04-30 DIAGNOSIS — Z9889 Other specified postprocedural states: Secondary | ICD-10-CM | POA: Diagnosis not present

## 2016-04-30 DIAGNOSIS — M545 Low back pain: Secondary | ICD-10-CM | POA: Diagnosis not present

## 2016-04-30 DIAGNOSIS — M25511 Pain in right shoulder: Secondary | ICD-10-CM | POA: Diagnosis not present

## 2016-05-02 DIAGNOSIS — M5416 Radiculopathy, lumbar region: Secondary | ICD-10-CM | POA: Diagnosis not present

## 2016-05-02 DIAGNOSIS — M50222 Other cervical disc displacement at C5-C6 level: Secondary | ICD-10-CM | POA: Diagnosis not present

## 2016-05-15 ENCOUNTER — Other Ambulatory Visit: Payer: Self-pay | Admitting: Family Medicine

## 2016-06-07 DIAGNOSIS — F3174 Bipolar disorder, in full remission, most recent episode manic: Secondary | ICD-10-CM | POA: Diagnosis not present

## 2016-06-07 DIAGNOSIS — F410 Panic disorder [episodic paroxysmal anxiety] without agoraphobia: Secondary | ICD-10-CM | POA: Diagnosis not present

## 2016-06-07 DIAGNOSIS — F3176 Bipolar disorder, in full remission, most recent episode depressed: Secondary | ICD-10-CM | POA: Diagnosis not present

## 2016-06-11 ENCOUNTER — Other Ambulatory Visit: Payer: Self-pay | Admitting: Family Medicine

## 2016-07-10 ENCOUNTER — Other Ambulatory Visit: Payer: Self-pay | Admitting: Family Medicine

## 2016-07-13 DIAGNOSIS — Z9889 Other specified postprocedural states: Secondary | ICD-10-CM | POA: Diagnosis not present

## 2016-08-06 ENCOUNTER — Ambulatory Visit (INDEPENDENT_AMBULATORY_CARE_PROVIDER_SITE_OTHER): Payer: BLUE CROSS/BLUE SHIELD | Admitting: Family Medicine

## 2016-08-06 ENCOUNTER — Encounter: Payer: Self-pay | Admitting: Family Medicine

## 2016-08-06 VITALS — BP 112/74 | HR 89 | Temp 97.5°F | Wt 209.0 lb

## 2016-08-06 DIAGNOSIS — Z23 Encounter for immunization: Secondary | ICD-10-CM

## 2016-08-06 DIAGNOSIS — E785 Hyperlipidemia, unspecified: Secondary | ICD-10-CM

## 2016-08-06 DIAGNOSIS — F3170 Bipolar disorder, currently in remission, most recent episode unspecified: Secondary | ICD-10-CM | POA: Diagnosis not present

## 2016-08-06 LAB — COMPREHENSIVE METABOLIC PANEL
ALBUMIN: 4.6 g/dL (ref 3.5–5.2)
ALT: 21 U/L (ref 0–53)
AST: 20 U/L (ref 0–37)
Alkaline Phosphatase: 78 U/L (ref 39–117)
BUN: 15 mg/dL (ref 6–23)
CHLORIDE: 101 meq/L (ref 96–112)
CO2: 28 meq/L (ref 19–32)
CREATININE: 1.2 mg/dL (ref 0.40–1.50)
Calcium: 9.7 mg/dL (ref 8.4–10.5)
GFR: 68.53 mL/min (ref >60.00)
Glucose, Bld: 74 mg/dL (ref 70–99)
POTASSIUM: 4.4 meq/L (ref 3.5–5.1)
SODIUM: 137 meq/L (ref 135–145)
Total Bilirubin: 0.3 mg/dL (ref 0.2–1.2)
Total Protein: 7.2 g/dL (ref 6.0–8.3)

## 2016-08-06 LAB — LIPID PANEL
CHOL/HDL RATIO: 4
CHOLESTEROL: 216 mg/dL — AB (ref 0–200)
HDL: 56.8 mg/dL (ref >39.00)
LDL CALC: 138 mg/dL — AB (ref 0–99)
NONHDL: 159.42
Triglycerides: 107 mg/dL (ref 0.0–149.0)
VLDL: 21.4 mg/dL (ref 0.0–40.0)

## 2016-08-06 MED ORDER — SIMVASTATIN 40 MG PO TABS: ORAL_TABLET | ORAL | 2 refills | Status: DC

## 2016-08-06 NOTE — Assessment & Plan Note (Signed)
Due for labs. eRx refills sent.

## 2016-08-06 NOTE — Progress Notes (Signed)
Subjective:   Patient ID: [name], male    DOB: Nov 02, 1965, 50 y.o.   MRN: [id number]  [name] is a pleasant 50 y.o. year old male who presents to clinic today with Follow-up (lipid)  on 08/06/2016  HPI:  HLD- restarted zocor in August.  Due for labs. Has no other complaints today.  Lab Results  Component Value Date   CHOL 239 (H) 01/03/2015   HDL 42.80 01/03/2015   LDLCALC 157 (H) 01/03/2015   LDLDIRECT 170.0 10/25/2010   TRIG 198.0 (H) 01/03/2015   CHOLHDL 6 01/03/2015   Lab Results  Component Value Date   ALT 26 08/09/2015   AST 26 08/09/2015   ALKPHOS 79 08/09/2015   BILITOT 0.3 08/09/2015    Current Outpatient Prescriptions on File Prior to Visit  Medication Sig Dispense Refill  . albuterol (PROAIR HFA) 108 (90 BASE) MCG/ACT inhaler Inhale 1-2 puffs into the lungs every 6 (six) hours as needed for wheezing or shortness of breath. 1 Inhaler 1  . ALPRAZolam (XANAX) 1 MG tablet TAKE 1 TABLET BY MOUTH 3 TIMES A DAY AS NEEDED FOR ANXIETY. (Patient taking differently: TAKE 1 TABLET BY MOUTH 3 TIMES A DAY) 90 tablet 0  . ANDROGEL PUMP 20.25 MG/ACT (1.62%) GEL APPLY 2 PUMPS ONTO THE SKIN DAILY 75 g 0  . diphenhydrAMINE (BENADRYL) 25 MG tablet Take 50 mg by mouth at bedtime.     Marland Kitchen lisinopril (PRINIVIL,ZESTRIL) 20 MG tablet TAKE 1 TABLET BY MOUTH DAILY. COMPLETE PHYSICAL EXAM REQUIRED FOR ADDITIONAL REFILLS 30 tablet 0  . methocarbamol (ROBAXIN) 500 MG tablet Take 1 tablet (500 mg total) by mouth 3 (three) times daily as needed for muscle spasms. 60 tablet 0  . ondansetron (ZOFRAN) 4 MG tablet Take 1 tablet (4 mg total) by mouth every 8 (eight) hours as needed for nausea or vomiting. 20 tablet 0  . oxyCODONE-acetaminophen (PERCOCET) 10-325 MG tablet Take 1 tablet by mouth every 4 (four) hours as needed for pain. 60 tablet 0  . oxymetazoline (AFRIN) 0.05 % nasal spray Place 2 sprays into both nostrils at bedtime. 2-3 sprays in each nostril    . pantoprazole  (PROTONIX) 40 MG tablet Take 1 tablet (40 mg total) by mouth daily. (Patient taking differently: Take 40 mg by mouth 2 (two) times daily. ) 30 tablet 11  . QUEtiapine (SEROQUEL) 300 MG tablet Take 600 mg by mouth at bedtime.     . sertraline (ZOLOFT) 100 MG tablet Take 100 mg by mouth daily before breakfast.     . tamsulosin (FLOMAX) 0.4 MG CAPS capsule Take 0.8 mg by mouth at bedtime.     . Testosterone (ANDROGEL) 20.25 MG/1.25GM (1.62%) GEL Place 2 application onto the skin daily. 75 g 0   No current facility-administered medications on file prior to visit.     No Known Allergies  Past Medical History:  Diagnosis Date  . Anxiety   . Arthritis    degeneration of spine & scoliosis  . Bipolar disorder ([place])   . GERD (gastroesophageal reflux disease)   . H/O echocardiogram ?2015  . Hyperlipidemia   . Hypertension   . Pneumonia 2002   [place] admission   . Shortness of breath dyspnea   . Sleep apnea    test 3/15-waiting results-was told oxygen did drop- did additional study done at home, told that he doesn't have sleep apnea     Past Surgical History:  Procedure Laterality Date  . ANTERIOR CERVICAL DECOMP/DISCECTOMY  FUSION N/A 08/17/2015   Procedure: ANTERIOR CERVICAL DECOMPRESSION/DISCECTOMY FUSION Cervical five-cervical seven. Two LEVELS;  Surgeon: Melina Schools, MD;  Location: [facility];  Service: Orthopedics;  Laterality: N/A;  . BRONCHOSCOPY    . MASS EXCISION Left 12/17/2013   Procedure: EXCISION MASS LEFT WRIST;  Surgeon: Tennis Must, MD;  Location: [facility];  Service: Orthopedics;  Laterality: Left;  . SEPTOPLASTY  1990    Family History  Problem Relation Age of Onset  . Cancer Mother     melanoma  . Depression Other   . Cancer Other     skin    Social History   Social History  . Marital status: Married    Spouse name: N/A  . Number of children: N/A  . Years of education: N/A   Occupational History  . Not on file.   Social History Main  Topics  . Smoking status: Current Every Day Smoker    Packs/day: 0.50  . Smokeless tobacco: Not on file  . Alcohol use No     Comment: quit 2007  . Drug use: No     Comment: history cocaine-last 2006  . Sexual activity: Not on file   Other Topics Concern  . Not on file   Social History Narrative  . No narrative on file   The PMH, PSH, Social History, Family History, Medications, and allergies have been reviewed in [facility], and have been updated if relevant.   Review of Systems  Constitutional: Negative.   HENT: Negative.   Respiratory: Negative.   Gastrointestinal: Negative.   Musculoskeletal: Negative.   Hematological: Negative.   Psychiatric/Behavioral: Negative.   All other systems reviewed and are negative.      Objective:    BP 112/74   Pulse 89   Temp 97.5 F (36.4 C) (Oral)   Wt 209 lb (94.8 kg)   SpO2 97%   BMI 30.86 kg/m    Physical Exam  Constitutional: He is oriented to person, place, and time. He appears well-developed and well-nourished. No distress.  HENT:  Head: Normocephalic.  Eyes: Conjunctivae are normal.  Cardiovascular: Normal rate and regular rhythm.   Pulmonary/Chest: Effort normal.  Musculoskeletal: Normal range of motion.  Neurological: He is alert and oriented to person, place, and time. No cranial nerve deficit.  Skin: Skin is warm and dry. He is not diaphoretic.  Psychiatric: He has a normal mood and affect. His behavior is normal. Judgment and thought content normal.  Nursing note and vitals reviewed.         Assessment & Plan:   Hyperlipidemia, unspecified hyperlipidemia type - Plan: Lipid panel, Comprehensive metabolic panel  Bipolar disorder in full remission, most recent episode unspecified type ([place])  Need for influenza vaccination - Plan: Flu Vaccine QUAD 36+ mos PF IM (Fluarix & Fluzone Quad PF) No Follow-up on file.

## 2016-08-07 ENCOUNTER — Ambulatory Visit: Payer: Self-pay | Admitting: Family Medicine

## 2016-08-11 ENCOUNTER — Other Ambulatory Visit: Payer: Self-pay | Admitting: Family Medicine

## 2016-09-01 IMAGING — CR DG CHEST 2V
2 series · 2 of 2 positions shown · non-contrast
Comparison: 11/20/2013

CLINICAL DATA: Chest wall pain, fall.

EXAM:
CHEST  2 VIEW

[view not recorded (1 of 2)]
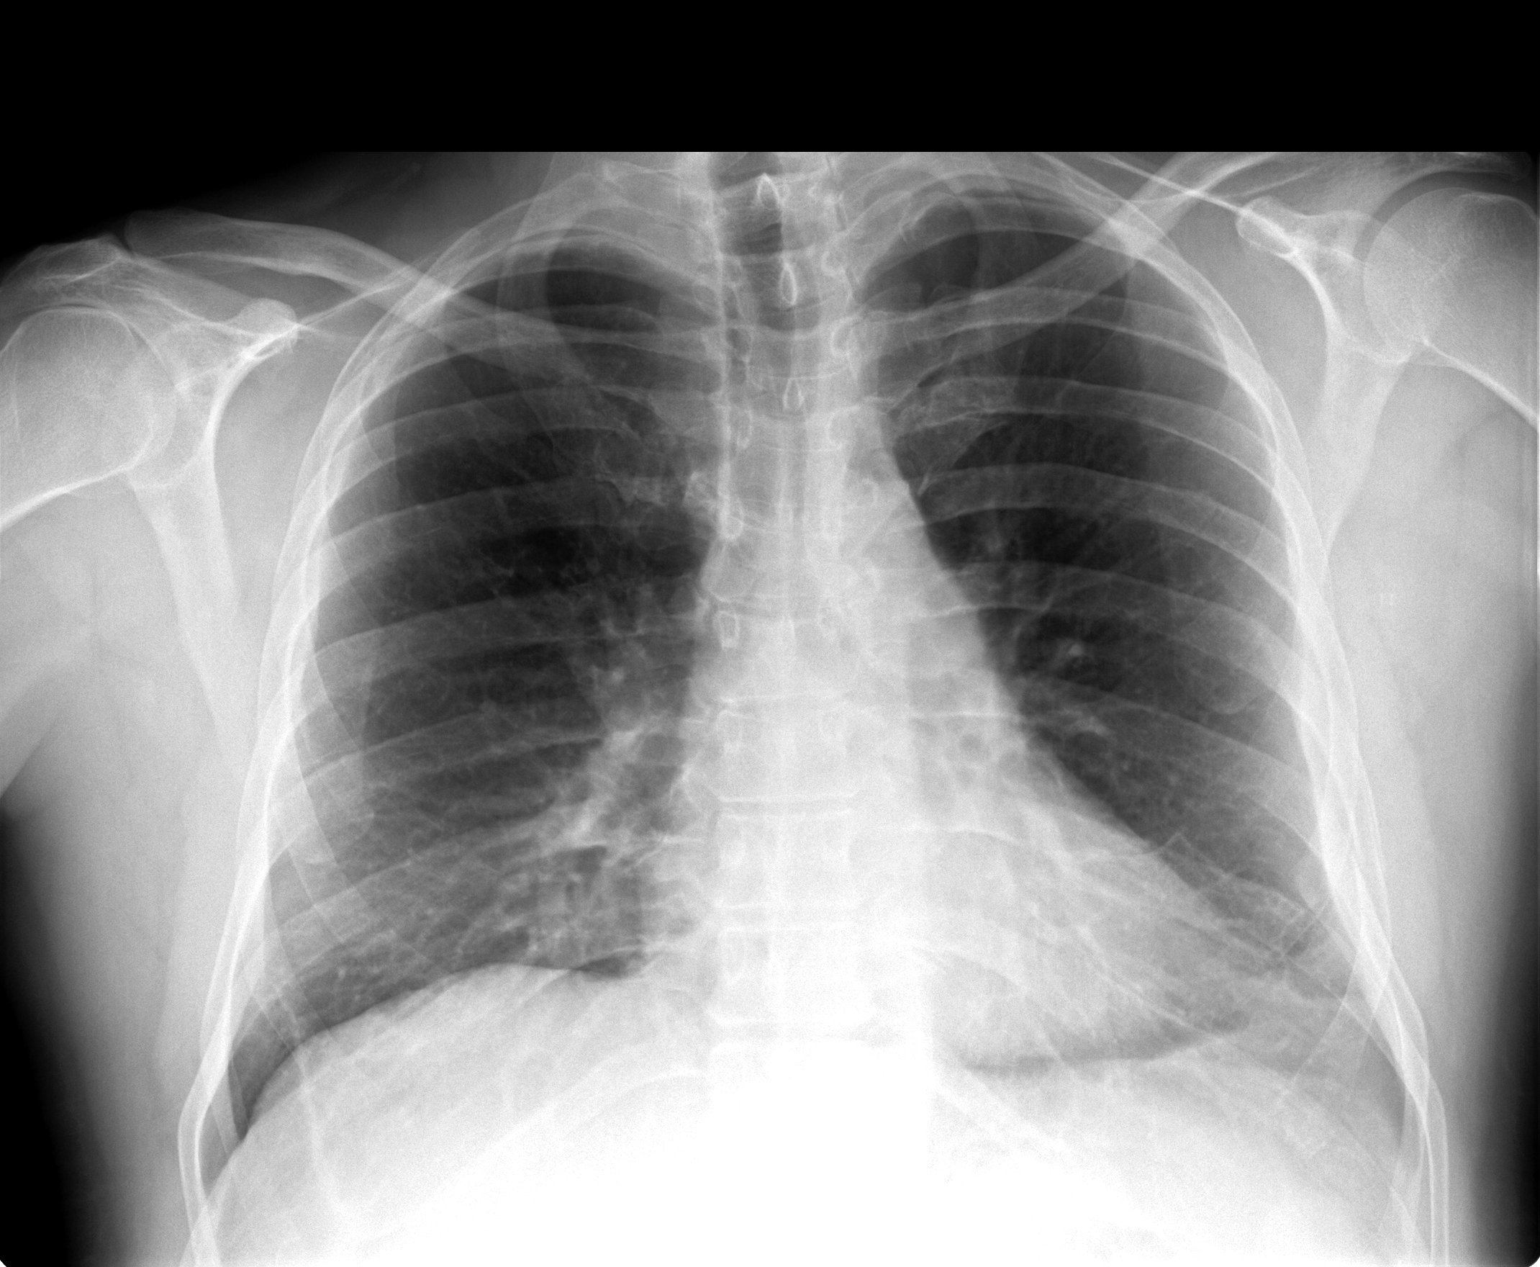

[view not recorded (2 of 2)]
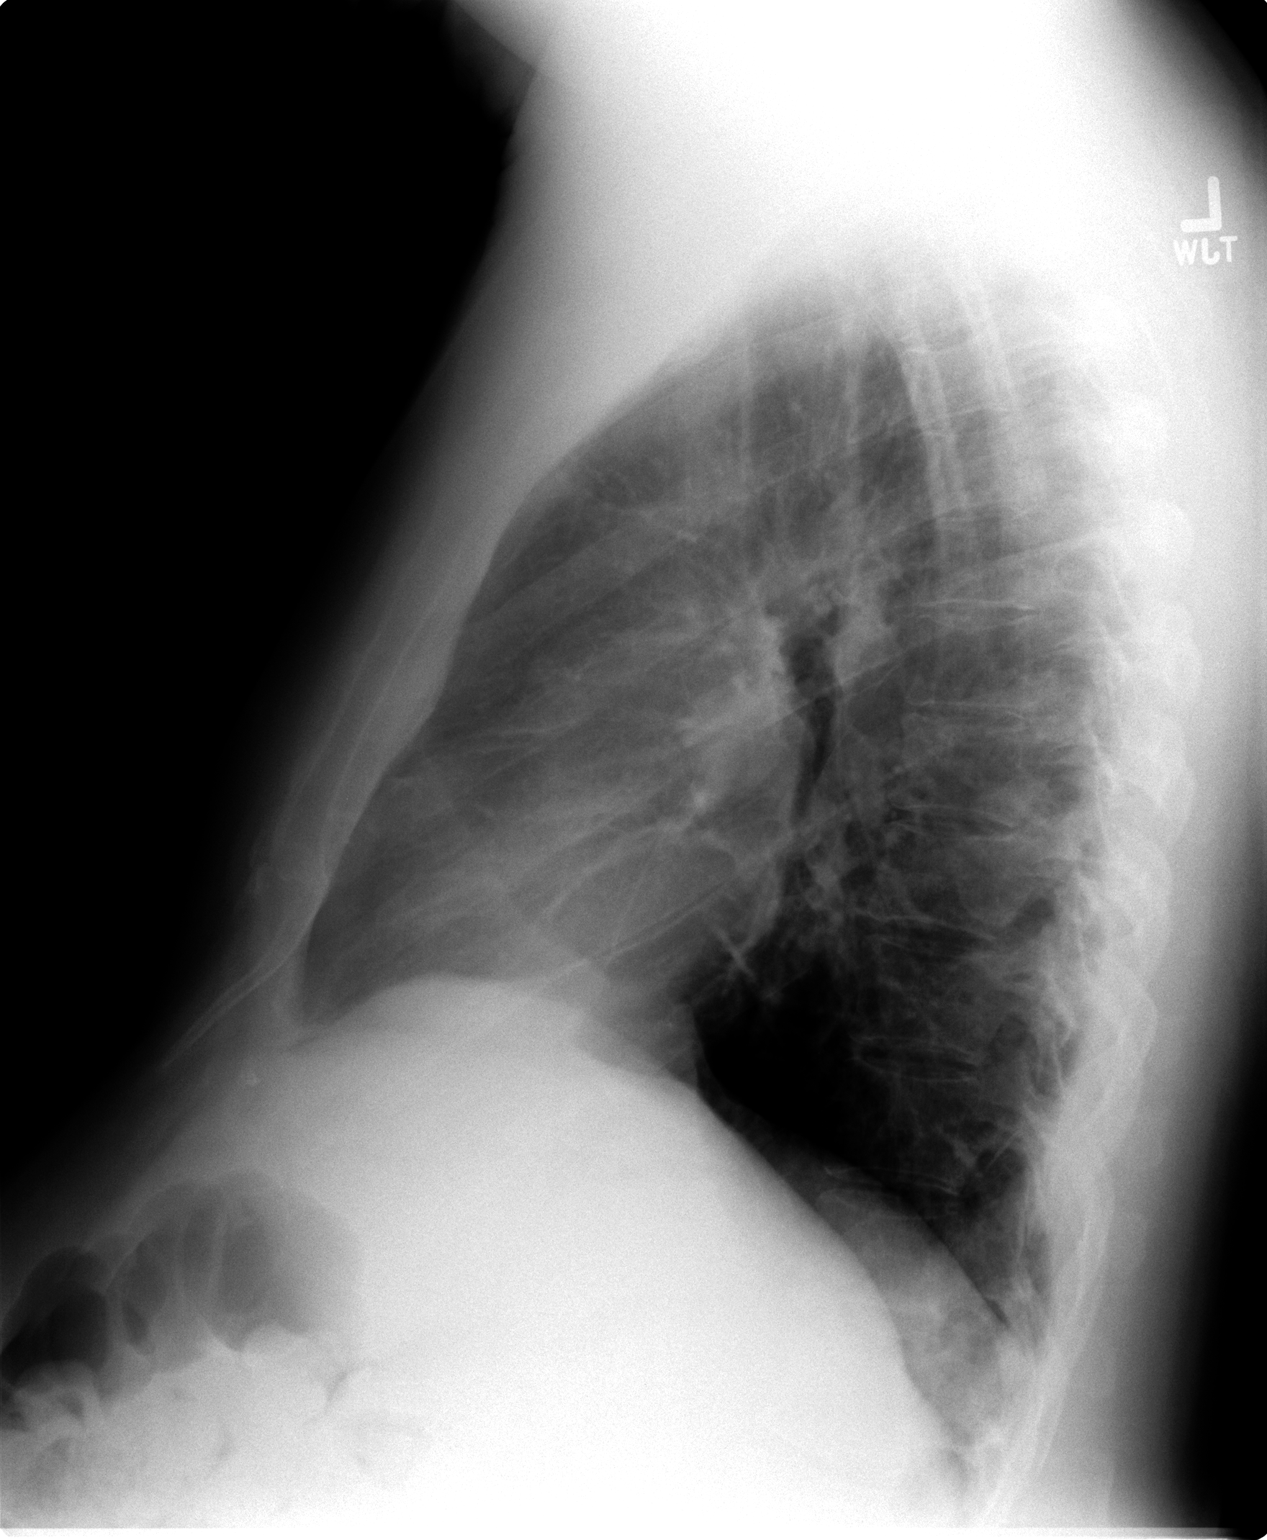

[2 of 2 positions shown; findings below may reference images not displayed]

FINDINGS: Probable acute fracture of left lateral eighth rib. No pneumothorax.
Ill-defined linear opacities at the left lung base likely
atelectasis. There is no pleural effusion. The right lung is clear.
The cardiomediastinal contours are normal.
IMPRESSION: Probable acute left lateral eighth rib fracture. No pneumothorax.
Suspect atelectasis of the left lung base.

## 2016-10-05 ENCOUNTER — Other Ambulatory Visit: Payer: Self-pay | Admitting: Family Medicine

## 2016-10-05 NOTE — Telephone Encounter (Signed)
Pt had f/u in 07/2016 but HTN not discussed, per chart. pls advise

## 2016-11-21 DIAGNOSIS — M5012 Mid-cervical disc disorder, unspecified level: Secondary | ICD-10-CM | POA: Diagnosis not present

## 2016-11-21 DIAGNOSIS — M545 Low back pain: Secondary | ICD-10-CM | POA: Diagnosis not present

## 2016-11-21 DIAGNOSIS — M542 Cervicalgia: Secondary | ICD-10-CM | POA: Diagnosis not present

## 2016-11-30 DIAGNOSIS — F3176 Bipolar disorder, in full remission, most recent episode depressed: Secondary | ICD-10-CM | POA: Diagnosis not present

## 2016-11-30 DIAGNOSIS — F3174 Bipolar disorder, in full remission, most recent episode manic: Secondary | ICD-10-CM | POA: Diagnosis not present

## 2016-11-30 DIAGNOSIS — F410 Panic disorder [episodic paroxysmal anxiety] without agoraphobia: Secondary | ICD-10-CM | POA: Diagnosis not present

## 2016-11-30 DIAGNOSIS — F4322 Adjustment disorder with anxiety: Secondary | ICD-10-CM | POA: Diagnosis not present

## 2016-12-04 DIAGNOSIS — M5012 Mid-cervical disc disorder, unspecified level: Secondary | ICD-10-CM | POA: Diagnosis not present

## 2016-12-04 DIAGNOSIS — M545 Low back pain: Secondary | ICD-10-CM | POA: Diagnosis not present

## 2016-12-10 DIAGNOSIS — N5201 Erectile dysfunction due to arterial insufficiency: Secondary | ICD-10-CM | POA: Diagnosis not present

## 2016-12-10 DIAGNOSIS — R351 Nocturia: Secondary | ICD-10-CM | POA: Diagnosis not present

## 2016-12-10 DIAGNOSIS — N401 Enlarged prostate with lower urinary tract symptoms: Secondary | ICD-10-CM | POA: Diagnosis not present

## 2017-01-15 ENCOUNTER — Other Ambulatory Visit: Payer: Self-pay | Admitting: Family Medicine

## 2017-02-28 DIAGNOSIS — Z9889 Other specified postprocedural states: Secondary | ICD-10-CM | POA: Diagnosis not present

## 2017-02-28 DIAGNOSIS — M5012 Mid-cervical disc disorder, unspecified level: Secondary | ICD-10-CM | POA: Diagnosis not present

## 2017-02-28 DIAGNOSIS — M62838 Other muscle spasm: Secondary | ICD-10-CM | POA: Diagnosis not present

## 2017-02-28 DIAGNOSIS — M545 Low back pain: Secondary | ICD-10-CM | POA: Diagnosis not present

## 2017-03-19 DIAGNOSIS — E291 Testicular hypofunction: Secondary | ICD-10-CM | POA: Diagnosis not present

## 2017-05-14 ENCOUNTER — Other Ambulatory Visit: Payer: Self-pay | Admitting: Family Medicine

## 2017-05-16 NOTE — Telephone Encounter (Signed)
Pantoprazole 40 mg sent to pharmacy. Patient must schedule appt for further refills

## 2017-05-31 DIAGNOSIS — M5136 Other intervertebral disc degeneration, lumbar region: Secondary | ICD-10-CM | POA: Diagnosis not present

## 2017-06-06 DIAGNOSIS — F3132 Bipolar disorder, current episode depressed, moderate: Secondary | ICD-10-CM | POA: Diagnosis not present

## 2017-06-06 DIAGNOSIS — F3111 Bipolar disorder, current episode manic without psychotic features, mild: Secondary | ICD-10-CM | POA: Diagnosis not present

## 2017-06-06 DIAGNOSIS — F410 Panic disorder [episodic paroxysmal anxiety] without agoraphobia: Secondary | ICD-10-CM | POA: Diagnosis not present

## 2017-07-04 ENCOUNTER — Other Ambulatory Visit: Payer: Self-pay | Admitting: Family Medicine

## 2017-07-27 DIAGNOSIS — M5136 Other intervertebral disc degeneration, lumbar region: Secondary | ICD-10-CM | POA: Diagnosis not present

## 2017-07-30 DIAGNOSIS — F3174 Bipolar disorder, in full remission, most recent episode manic: Secondary | ICD-10-CM | POA: Diagnosis not present

## 2017-07-30 DIAGNOSIS — F3131 Bipolar disorder, current episode depressed, mild: Secondary | ICD-10-CM | POA: Diagnosis not present

## 2017-08-29 ENCOUNTER — Other Ambulatory Visit: Payer: Self-pay | Admitting: Family Medicine

## 2017-08-30 NOTE — Telephone Encounter (Signed)
Advised in Oct that needs appt in Nov/must sched for refills/thx dmf

## 2017-09-06 ENCOUNTER — Telehealth: Payer: Self-pay | Admitting: Family Medicine

## 2017-09-06 MED ORDER — LISINOPRIL 20 MG PO TABS: 20.0000 mg | ORAL_TABLET | Freq: Every day | ORAL | 1 refills | Status: DC

## 2017-09-06 NOTE — Telephone Encounter (Signed)
Pt was seen Dr. Deborra Medina, pt would like to know if he could establish care with Dr. Damita Dunnings?   Pt also stated that he need a refill on his Lisinopril.     Please advise

## 2017-09-06 NOTE — Telephone Encounter (Signed)
Rx sent in for 30 tablets with 1 refill to get patient through until he establishes with another provider.

## 2017-09-06 NOTE — Telephone Encounter (Signed)
I will send the note to front desk to schedule new pt to establish but until establishes care at [facility] will fwd note also to [facility] flow coordinator about refill of med.

## 2017-09-06 NOTE — Telephone Encounter (Signed)
Appointment 1/16 with Debbie Pt aware

## 2017-10-09 ENCOUNTER — Encounter: Payer: Self-pay | Admitting: Family Medicine

## 2017-10-09 ENCOUNTER — Ambulatory Visit: Payer: BLUE CROSS/BLUE SHIELD | Admitting: Family Medicine

## 2017-10-09 VITALS — BP 128/88 | HR 99 | Temp 97.6°F | Wt 240.0 lb

## 2017-10-09 DIAGNOSIS — Z23 Encounter for immunization: Secondary | ICD-10-CM | POA: Diagnosis not present

## 2017-10-09 DIAGNOSIS — E785 Hyperlipidemia, unspecified: Secondary | ICD-10-CM

## 2017-10-09 DIAGNOSIS — B351 Tinea unguium: Secondary | ICD-10-CM | POA: Diagnosis not present

## 2017-10-09 DIAGNOSIS — R21 Rash and other nonspecific skin eruption: Secondary | ICD-10-CM | POA: Diagnosis not present

## 2017-10-09 DIAGNOSIS — Z7689 Persons encountering health services in other specified circumstances: Secondary | ICD-10-CM | POA: Diagnosis not present

## 2017-10-09 DIAGNOSIS — K219 Gastro-esophageal reflux disease without esophagitis: Secondary | ICD-10-CM

## 2017-10-09 DIAGNOSIS — R635 Abnormal weight gain: Secondary | ICD-10-CM | POA: Diagnosis not present

## 2017-10-09 DIAGNOSIS — F329 Major depressive disorder, single episode, unspecified: Secondary | ICD-10-CM

## 2017-10-09 DIAGNOSIS — Z79899 Other long term (current) drug therapy: Secondary | ICD-10-CM

## 2017-10-09 DIAGNOSIS — I10 Essential (primary) hypertension: Secondary | ICD-10-CM

## 2017-10-09 LAB — COMPREHENSIVE METABOLIC PANEL
ALBUMIN: 4.3 g/dL (ref 3.5–5.2)
ALK PHOS: 75 U/L (ref 39–117)
ALT: 20 U/L (ref 0–53)
AST: 20 U/L (ref 0–37)
BUN: 10 mg/dL (ref 6–23)
CO2: 27 mEq/L (ref 19–32)
CREATININE: 1.11 mg/dL (ref 0.40–1.50)
Calcium: 9.4 mg/dL (ref 8.4–10.5)
Chloride: 102 mEq/L (ref 96–112)
GFR: 74.62 mL/min (ref >60.00)
Glucose, Bld: 83 mg/dL (ref 70–99)
POTASSIUM: 4.4 meq/L (ref 3.5–5.1)
SODIUM: 136 meq/L (ref 135–145)
TOTAL PROTEIN: 7.1 g/dL (ref 6.0–8.3)
Total Bilirubin: 0.4 mg/dL (ref 0.2–1.2)

## 2017-10-09 LAB — LIPID PANEL
Cholesterol: 159 mg/dL (ref 0–200)
HDL: 48.6 mg/dL (ref >39.00)
LDL Cholesterol: 82 mg/dL (ref 0–99)
NONHDL: 110.13
Total CHOL/HDL Ratio: 3
Triglycerides: 141 mg/dL (ref 0.0–149.0)
VLDL: 28.2 mg/dL (ref 0.0–40.0)

## 2017-10-09 LAB — CBC WITH DIFFERENTIAL/PLATELET
Basophils Absolute: 0.1 10*3/uL (ref 0.0–0.1)
Basophils Relative: 0.7 % (ref 0.0–3.0)
EOS PCT: 1.2 % (ref 0.0–5.0)
Eosinophils Absolute: 0.1 10*3/uL (ref 0.0–0.7)
HEMATOCRIT: 40.6 % (ref 39.0–52.0)
HEMOGLOBIN: 13.6 g/dL (ref 13.0–17.0)
LYMPHS PCT: 24.4 % (ref 12.0–46.0)
Lymphs Abs: 1.9 10*3/uL (ref 0.7–4.0)
MCHC: 33.5 g/dL (ref 30.0–36.0)
MCV: 91.9 fl (ref 78.0–100.0)
MONO ABS: 0.6 10*3/uL (ref 0.1–1.0)
MONOS PCT: 7.5 % (ref 3.0–12.0)
Neutro Abs: 5.1 10*3/uL (ref 1.4–7.7)
Neutrophils Relative %: 66.2 % (ref 43.0–77.0)
Platelets: 261 10*3/uL (ref 150.0–400.0)
RBC: 4.42 Mil/uL (ref 4.22–5.81)
RDW: 13.4 % (ref 11.5–15.5)
WBC: 7.7 10*3/uL (ref 4.0–10.5)

## 2017-10-09 LAB — TSH: TSH: 2.48 u[IU]/mL (ref 0.35–4.50)

## 2017-10-09 MED ORDER — SIMVASTATIN 40 MG PO TABS: ORAL_TABLET | ORAL | 1 refills | Status: DC

## 2017-10-09 MED ORDER — LISINOPRIL 20 MG PO TABS: 20.0000 mg | ORAL_TABLET | Freq: Every day | ORAL | 1 refills | Status: DC

## 2017-10-09 MED ORDER — PANTOPRAZOLE SODIUM 40 MG PO TBEC: 40.0000 mg | DELAYED_RELEASE_TABLET | Freq: Every day | ORAL | 1 refills | Status: DC

## 2017-10-09 MED ORDER — TERBINAFINE HCL 250 MG PO TABS: 250.0000 mg | ORAL_TABLET | Freq: Every day | ORAL | 0 refills | Status: DC

## 2017-10-09 NOTE — Patient Instructions (Addendum)
I think you have a fungal skin infection on your left arm- try over the counter lotrimin daily for up to 2 weeks,   Please schedule a complete physical for 6 months  Please bring in sample of toenail to check for fungus Fungal Nail Infection Fungal nail infection is a common fungal infection of the toenails or fingernails. This condition affects toenails more often than fingernails. More than one nail may be infected. The condition can be passed from person to person (is contagious). What are the causes? This condition is caused by a fungus. Several types of funguses can cause the infection. These funguses are common in moist and warm areas. If your hands or feet come into contact with the fungus, it may get into a crack in your fingernail or toenail and cause the infection. What increases the risk? The following factors may make you more likely to develop this condition:  Being male.  Having diabetes.  Being of older age.  Living with someone who has the fungus.  Walking barefoot in areas where the fungus thrives, such as showers or locker rooms.  Having poor circulation.  Wearing shoes and socks that cause your feet to sweat.  Having athlete's foot.  Having a nail injury or history of a recent nail surgery.  Having psoriasis.  Having a weak body defense system (immune system).  What are the signs or symptoms? Symptoms of this condition include:  A pale spot on the nail.  Thickening of the nail.  A nail that becomes yellow or brown.  A brittle or ragged nail edge.  A crumbling nail.  A nail that has lifted away from the nail bed.  How is this diagnosed? This condition is diagnosed with a physical exam. Your health care provider may take a scraping or clipping from your nail to test for the fungus. How is this treated? Mild infections do not need treatment. If you have significant nail changes, treatment may include:  Oral antifungal medicines. You may need to  take the medicine for several weeks or several months, and you may not see the results for a long time. These medicines can cause side effects. Ask your health care provider what problems to watch for.  Antifungal nail polish and nail cream. These may be used along with oral antifungal medicines.  Laser treatment of the nail.  Surgery to remove the nail. This may be needed for the most severe infections.  Treatment takes a long time, and the infection may come back. Follow these instructions at home: Medicines  Take or apply over-the-counter and prescription medicines only as told by your health care provider.  Ask your health care provider about using over-the-counter mentholated ointment on your nails. Lifestyle   Do not share personal items, such as towels or nail clippers.  Trim your nails often.  Wash and dry your hands and feet every day.  Wear absorbent socks, and change your socks frequently.  Wear shoes that allow air to circulate, such as sandals or canvas tennis shoes. Throw out old shoes.  Wear rubber gloves if you are working with your hands in wet areas.  Do not walk barefoot in shower rooms or locker rooms.  Do not use a nail salon that does not use clean instruments.  Do not use artificial nails. General instructions  Keep all follow-up visits as told by your health care provider. This is important.  Use antifungal foot powder on your feet and in your shoes. Contact a health  care provider if: Your infection is not getting better or it is getting worse after several months. This information is not intended to replace advice given to you by your health care provider. Make sure you discuss any questions you have with your health care provider. Document Released: 09/07/2000 Document Revised: 02/16/2016 Document Reviewed: 03/14/2015 Elsevier Interactive Patient Education  2018 [employer].

## 2017-10-09 NOTE — Progress Notes (Signed)
Subjective:    Patient ID: [name], male    DOB: Apr 14, 1966, 52 y.o.   MRN: [id number]  HPI This is a 52 yo male who presents today to establish care. Is transferring from Dr. Deborra Medina.  Has a 46 year old daughter, enjoys spending time with family, watching TV. Lives with wife and daughter.  Owns a car business, was working as a Dealer. [place].   Sees pain management at [facility]. Sees psychiatry. Is currently trying to get disability.  Sees Dr. Toy Care (psychiatry)- "getting by." Doesn't go to therapy, "I tried that but it didn't seem to work for me." Tired of hurting.   Today he has concerns for toenail fungus and a rash on his left upper arm. Several toes on his left foot have been thickened and yellow, seems to be spreading, started on great toe, now on great and 5th toe. Has red, itchy area on left upper arm. Near the place where he applies Andro-gel. Doesn't use daily (too much of a hassle). No rash on other arm which he also uses for Andro Gel application.   Last CPE- not in several years PSA- followed by urology for low testosterone Colonoscopy- NA Tetanus- 11/11/2007 Flu- will have today Dental- over due Eye-about 2 years Exercise- is limited by his back/neck, can walk and has some exercises from PT    Past Medical History:  Diagnosis Date  . Anxiety   . Arthritis    degeneration of spine & scoliosis  . Bipolar disorder ([place])   . GERD (gastroesophageal reflux disease)   . H/O echocardiogram ?2015  . Hyperlipidemia   . Hypertension   . Pneumonia 2002   [place] admission   . Shortness of breath dyspnea   . Sleep apnea    test 3/15-waiting results-was told oxygen did drop- did additional study done at home, told that he doesn't have sleep apnea    Past Surgical History:  Procedure Laterality Date  . ANTERIOR CERVICAL DECOMP/DISCECTOMY FUSION N/A 08/17/2015   Procedure: ANTERIOR CERVICAL DECOMPRESSION/DISCECTOMY FUSION Cervical  five-cervical seven. Two LEVELS;  Surgeon: Melina Schools, MD;  Location: [facility];  Service: Orthopedics;  Laterality: N/A;  . BRONCHOSCOPY    . MASS EXCISION Left 12/17/2013   Procedure: EXCISION MASS LEFT WRIST;  Surgeon: Tennis Must, MD;  Location: [facility];  Service: Orthopedics;  Laterality: Left;  . SEPTOPLASTY  1990   Family History  Problem Relation Age of Onset  . Cancer Mother        melanoma  . Depression Other   . Cancer Other        skin   Social History   Tobacco Use  . Smoking status: Current Every Day Smoker    Packs/day: 0.50  . Smokeless tobacco: Former Network engineer Use Topics  . Alcohol use: No    Alcohol/week: 0.0 oz    Comment: quit 2007  . Drug use: No    Comment: history cocaine-last 2006      Review of Systems  Constitutional: Positive for unexpected weight change (increased, he thinks from psych meds).  Respiratory: Negative for cough, chest tightness and shortness of breath.   Cardiovascular: Negative for chest pain, palpitations and leg swelling.  Musculoskeletal: Positive for arthralgias, gait problem and neck pain.  Skin: Positive for rash (left upper arm).       Thickened, discolored toenails.   Psychiatric/Behavioral: Positive for dysphoric mood and sleep disturbance.  Objective:   Physical Exam  Constitutional: He is oriented to person, place, and time. He appears well-developed and well-nourished. No distress.  Obese.   HENT:  Head: Normocephalic and atraumatic.  Eyes: Conjunctivae are normal.  Cardiovascular: Normal rate, regular rhythm and normal heart sounds.  Pulmonary/Chest: Effort normal and breath sounds normal.  Musculoskeletal: He exhibits no edema.  Neurological: He is alert and oriented to person, place, and time.  Skin: Skin is warm and dry. Rash (Approximately 4 cm area erythema and mild scaling on upper left arm. Border slightly darker. ) noted. He is not diaphoretic.  Left great toenail and 5th  toenail thickened and yellowed.   Vitals reviewed.     BP 128/88 (BP Location: Right Arm, Patient Position: Sitting, Cuff Size: Large)   Pulse 99   Temp 97.6 F (36.4 C) (Oral)   Wt 240 lb (108.9 kg)   SpO2 96%   BMI 35.44 kg/m  Wt Readings from Last 3 Encounters:  10/09/17 240 lb (108.9 kg)  08/06/16 209 lb (94.8 kg)  08/09/15 222 lb 6.4 oz (100.9 kg)       Assessment & Plan:  1. Encounter to establish care - will check labs today and follow up in 6 months  2. Need for influenza vaccination - Flu Vaccine QUAD 6+ mos PF IM (Fluarix Quad PF)  3. Onychomycosis - unable to get a nail sample in office today, patient will collect at home and bring in for confirmation - Provided written and verbal information regarding diagnosis and treatment. - discussed length of treatment and that results can take months to see - terbinafine (LAMISIL) 250 MG tablet; Take 1 tablet (250 mg total) by mouth daily.  Dispense: 84 tablet; Refill: 0 - Culture, fungus without smear; Future  4. Hyperlipidemia, unspecified hyperlipidemia type - Lipid Panel - simvastatin (ZOCOR) 40 MG tablet; TAKE 1 TABLET BY MOUTH DAILY AT 6 PM.  Dispense: 90 tablet; Refill: 1  5. High risk medication use - CBC with Differential - Comprehensive metabolic panel - TSH  6. Weight gain - encouraged him to decrease carbs (discussed increased cravings on psych meds) and try to walk daily - CBC with Differential - Comprehensive metabolic panel - TSH  7. Essential hypertension - well controlled - lisinopril (PRINIVIL,ZESTRIL) 20 MG tablet; Take 1 tablet (20 mg total) by mouth daily.  Dispense: 30 tablet; Refill: 1  8. Gastroesophageal reflux disease, esophagitis presence not specified - well controlled on daily pantoprazole - pantoprazole (PROTONIX) 40 MG tablet; Take 1 tablet (40 mg total) by mouth daily.  Dispense: 90 tablet; Refill: 1  9. Reactive depression - Good family support, is grappling with physical  pain and limitation to daily activities - continue follow up with psychiatry  10. Rash - looks fungal, he will try OTC anit fungal and let me know if not improved in 1-14 days  Clarene Reamer, FNP-BC  [facility] at [facility], [place]  10/11/2017 7:42 AM

## 2017-10-11 ENCOUNTER — Encounter: Payer: Self-pay | Admitting: Family Medicine

## 2017-11-06 DIAGNOSIS — F3174 Bipolar disorder, in full remission, most recent episode manic: Secondary | ICD-10-CM | POA: Diagnosis not present

## 2017-11-06 DIAGNOSIS — F3132 Bipolar disorder, current episode depressed, moderate: Secondary | ICD-10-CM | POA: Diagnosis not present

## 2017-11-07 DIAGNOSIS — G894 Chronic pain syndrome: Secondary | ICD-10-CM | POA: Diagnosis not present

## 2017-11-07 DIAGNOSIS — Z79891 Long term (current) use of opiate analgesic: Secondary | ICD-10-CM | POA: Diagnosis not present

## 2017-12-17 DIAGNOSIS — M5416 Radiculopathy, lumbar region: Secondary | ICD-10-CM | POA: Insufficient documentation

## 2017-12-17 DIAGNOSIS — M5030 Other cervical disc degeneration, unspecified cervical region: Secondary | ICD-10-CM | POA: Insufficient documentation

## 2018-01-03 ENCOUNTER — Other Ambulatory Visit: Payer: Self-pay | Admitting: Family Medicine

## 2018-01-03 DIAGNOSIS — B351 Tinea unguium: Secondary | ICD-10-CM

## 2018-02-06 DIAGNOSIS — G894 Chronic pain syndrome: Secondary | ICD-10-CM | POA: Diagnosis not present

## 2018-02-06 DIAGNOSIS — Z79891 Long term (current) use of opiate analgesic: Secondary | ICD-10-CM | POA: Diagnosis not present

## 2018-02-24 DIAGNOSIS — F410 Panic disorder [episodic paroxysmal anxiety] without agoraphobia: Secondary | ICD-10-CM | POA: Diagnosis not present

## 2018-02-24 DIAGNOSIS — F3111 Bipolar disorder, current episode manic without psychotic features, mild: Secondary | ICD-10-CM | POA: Diagnosis not present

## 2018-02-24 DIAGNOSIS — F3131 Bipolar disorder, current episode depressed, mild: Secondary | ICD-10-CM | POA: Diagnosis not present

## 2018-03-05 ENCOUNTER — Other Ambulatory Visit: Payer: Self-pay | Admitting: Family Medicine

## 2018-03-05 DIAGNOSIS — I10 Essential (primary) hypertension: Secondary | ICD-10-CM

## 2018-03-11 ENCOUNTER — Other Ambulatory Visit: Payer: Self-pay | Admitting: Family Medicine

## 2018-03-11 DIAGNOSIS — N5201 Erectile dysfunction due to arterial insufficiency: Secondary | ICD-10-CM | POA: Diagnosis not present

## 2018-03-11 DIAGNOSIS — K219 Gastro-esophageal reflux disease without esophagitis: Secondary | ICD-10-CM

## 2018-03-11 DIAGNOSIS — E291 Testicular hypofunction: Secondary | ICD-10-CM | POA: Diagnosis not present

## 2018-03-20 DIAGNOSIS — E291 Testicular hypofunction: Secondary | ICD-10-CM | POA: Diagnosis not present

## 2018-04-07 ENCOUNTER — Other Ambulatory Visit: Payer: Self-pay | Admitting: Family Medicine

## 2018-04-07 DIAGNOSIS — I10 Essential (primary) hypertension: Secondary | ICD-10-CM

## 2018-04-07 DIAGNOSIS — E785 Hyperlipidemia, unspecified: Secondary | ICD-10-CM

## 2018-04-09 ENCOUNTER — Encounter: Payer: Self-pay | Admitting: Family Medicine

## 2018-04-09 ENCOUNTER — Encounter: Payer: Self-pay | Admitting: Gastroenterology

## 2018-04-09 ENCOUNTER — Ambulatory Visit: Payer: BLUE CROSS/BLUE SHIELD | Admitting: Family Medicine

## 2018-04-09 VITALS — BP 90/64 | HR 108 | Temp 98.2°F | Ht 69.0 in | Wt 226.0 lb

## 2018-04-09 DIAGNOSIS — Z1211 Encounter for screening for malignant neoplasm of colon: Secondary | ICD-10-CM

## 2018-04-09 DIAGNOSIS — K219 Gastro-esophageal reflux disease without esophagitis: Secondary | ICD-10-CM | POA: Diagnosis not present

## 2018-04-09 DIAGNOSIS — E785 Hyperlipidemia, unspecified: Secondary | ICD-10-CM | POA: Diagnosis not present

## 2018-04-09 DIAGNOSIS — Z79899 Other long term (current) drug therapy: Secondary | ICD-10-CM

## 2018-04-09 DIAGNOSIS — I10 Essential (primary) hypertension: Secondary | ICD-10-CM | POA: Diagnosis not present

## 2018-04-09 DIAGNOSIS — B351 Tinea unguium: Secondary | ICD-10-CM | POA: Diagnosis not present

## 2018-04-09 DIAGNOSIS — J4520 Mild intermittent asthma, uncomplicated: Secondary | ICD-10-CM

## 2018-04-09 LAB — TSH: TSH: 1.14 u[IU]/mL (ref 0.35–4.50)

## 2018-04-09 LAB — CBC WITH DIFFERENTIAL/PLATELET
BASOS ABS: 0 10*3/uL (ref 0.0–0.1)
BASOS PCT: 0.6 % (ref 0.0–3.0)
Eosinophils Absolute: 0 10*3/uL (ref 0.0–0.7)
Eosinophils Relative: 0.4 % (ref 0.0–5.0)
HEMATOCRIT: 44.1 % (ref 39.0–52.0)
HEMOGLOBIN: 14.9 g/dL (ref 13.0–17.0)
Lymphocytes Relative: 18.4 % (ref 12.0–46.0)
Lymphs Abs: 1.5 10*3/uL (ref 0.7–4.0)
MCHC: 33.9 g/dL (ref 30.0–36.0)
MCV: 91.9 fl (ref 78.0–100.0)
Monocytes Absolute: 0.5 10*3/uL (ref 0.1–1.0)
Monocytes Relative: 5.7 % (ref 3.0–12.0)
Neutro Abs: 6 10*3/uL (ref 1.4–7.7)
Neutrophils Relative %: 74.9 % (ref 43.0–77.0)
Platelets: 268 10*3/uL (ref 150.0–400.0)
RBC: 4.79 Mil/uL (ref 4.22–5.81)
RDW: 13.1 % (ref 11.5–15.5)
WBC: 8 10*3/uL (ref 4.0–10.5)

## 2018-04-09 LAB — COMPREHENSIVE METABOLIC PANEL
ALT: 24 U/L (ref 0–53)
AST: 20 U/L (ref 0–37)
Albumin: 4.8 g/dL (ref 3.5–5.2)
Alkaline Phosphatase: 63 U/L (ref 39–117)
BILIRUBIN TOTAL: 0.5 mg/dL (ref 0.2–1.2)
BUN: 8 mg/dL (ref 6–23)
CO2: 28 meq/L (ref 19–32)
CREATININE: 1.29 mg/dL (ref 0.40–1.50)
Calcium: 9.8 mg/dL (ref 8.4–10.5)
Chloride: 98 mEq/L (ref 96–112)
GFR: 62.61 mL/min (ref >60.00)
GLUCOSE: 94 mg/dL (ref 70–99)
Potassium: 4.2 mEq/L (ref 3.5–5.1)
Sodium: 135 mEq/L (ref 135–145)
TOTAL PROTEIN: 7.7 g/dL (ref 6.0–8.3)

## 2018-04-09 LAB — LIPID PANEL
Cholesterol: 134 mg/dL (ref 0–200)
HDL: 45.8 mg/dL (ref >39.00)
LDL CALC: 67 mg/dL (ref 0–99)
NONHDL: 88.13
Total CHOL/HDL Ratio: 3
Triglycerides: 104 mg/dL (ref 0.0–149.0)
VLDL: 20.8 mg/dL (ref 0.0–40.0)

## 2018-04-09 MED ORDER — SIMVASTATIN 40 MG PO TABS: ORAL_TABLET | ORAL | 1 refills | Status: DC

## 2018-04-09 MED ORDER — PANTOPRAZOLE SODIUM 40 MG PO TBEC: 40.0000 mg | DELAYED_RELEASE_TABLET | Freq: Every day | ORAL | 1 refills | Status: DC

## 2018-04-09 MED ORDER — LISINOPRIL 20 MG PO TABS: 20.0000 mg | ORAL_TABLET | Freq: Every day | ORAL | 1 refills | Status: DC

## 2018-04-09 MED ORDER — ALBUTEROL SULFATE HFA 108 (90 BASE) MCG/ACT IN AERS: 1.0000 | INHALATION_SPRAY | Freq: Four times a day (QID) | RESPIRATORY_TRACT | 1 refills | Status: DC | PRN

## 2018-04-09 MED ORDER — TERBINAFINE HCL 250 MG PO TABS: 250.0000 mg | ORAL_TABLET | Freq: Every day | ORAL | 0 refills | Status: DC

## 2018-04-09 NOTE — Patient Instructions (Addendum)
Look at Sleep training for your 52 year old  If you are having any lightheadedness or dizziness, decrease your lisinopril to 1/2 tablet daily ,drink enough water to make your urine light yellow  Please ask [facility] to fax me office notes and labs- [phone]  Schedule follow up for 6 months for complete physical exam  I have put in referral to gastroenterology for a colonoscopy, they will call you

## 2018-04-09 NOTE — Progress Notes (Signed)
Subjective:    Patient ID: [name], male    DOB: 1966/07/21, 52 y.o.   MRN: [id number]  HPI This is a 53 yo male who presents today for follow up of onychomycosis, hyperlipidemia, htn, GERD.   Onychomycosis- did not take terbinafine, was concerned about side effects. Continued to be concerned about appearance of nails. No pain.   Hyperlipidemia- has been watching diet and has lost weight  HTN- tolerating lisinopril, occasional positional lightheadedness with standing up from bending over.   GERD- well controlled with pantoprazole  Sees urology and has switched from andro gel to injection, some improvement.   Still working on getting approved for his disability. Pain is about the same. Continues to be managed by pain management.  Sleep- psychiatrist increased clonazepam to 2 mg at night. Some improvement. Has 5 yo daughter and sleep disrupted frequently by daughter.   Past Medical History:  Diagnosis Date  . Anxiety   . Arthritis    degeneration of spine & scoliosis  . Bipolar disorder ([place])   . GERD (gastroesophageal reflux disease)   . H/O echocardiogram ?2015  . Hyperlipidemia   . Hypertension   . Pneumonia 2002   [place] admission   . Shortness of breath dyspnea   . Sleep apnea    test 3/15-waiting results-was told oxygen did drop- did additional study done at home, told that he doesn't have sleep apnea    Past Surgical History:  Procedure Laterality Date  . ANTERIOR CERVICAL DECOMP/DISCECTOMY FUSION N/A 08/17/2015   Procedure: ANTERIOR CERVICAL DECOMPRESSION/DISCECTOMY FUSION Cervical five-cervical seven. Two LEVELS;  Surgeon: Melina Schools, MD;  Location: [facility];  Service: Orthopedics;  Laterality: N/A;  . BRONCHOSCOPY    . MASS EXCISION Left 12/17/2013   Procedure: EXCISION MASS LEFT WRIST;  Surgeon: Tennis Must, MD;  Location: [facility];  Service: Orthopedics;  Laterality: Left;  . SEPTOPLASTY  1990   Family History  Problem Relation  Age of Onset  . Cancer Mother        melanoma  . Depression Other   . Cancer Other        skin   Social History   Tobacco Use  . Smoking status: Current Every Day Smoker    Packs/day: 0.50  . Smokeless tobacco: Former Network engineer Use Topics  . Alcohol use: No    Alcohol/week: 0.0 oz    Comment: quit 2007  . Drug use: No    Comment: history cocaine-last 2006      Review of Systems Per HPI    Objective:   Physical Exam Physical Exam  Constitutional: Oriented to person, place, and time. He appears well-developed and well-nourished.  HENT:  Head: Normocephalic and atraumatic.  Eyes: Conjunctivae are normal.  Neck: Normal range of motion. Neck supple.  Cardiovascular: Normal rate, regular rhythm and normal heart sounds.   Pulmonary/Chest: Effort normal and breath sounds normal.  Musculoskeletal: Normal range of motion.  Neurological: Alert and oriented to person, place, and time.  Skin: Skin is warm and dry.  Psychiatric: Normal mood and affect. Behavior is normal. Judgment and thought content normal.  Vitals reviewed.  BP 90/64 (BP Location: Right Arm, Patient Position: Sitting, Cuff Size: Large)   Pulse (!) 108   Temp 98.2 F (36.8 C) (Oral)   Ht 5\' 9"  (1.753 m)   Wt 226 lb (102.5 kg)   SpO2 96%   BMI 33.37 kg/m  Wt Readings from Last 3 Encounters:  04/09/18  226 lb (102.5 kg)  10/09/17 240 lb (108.9 kg)  08/06/16 209 lb (94.8 kg)  Recheck BP- 92/60 BP Readings from Last 3 Encounters:  04/09/18 90/64  10/09/17 128/88  08/06/16 112/74      Assessment & Plan:  1. Onychomycosis - discussed potential side effects and normal LFTs - terbinafine (LAMISIL) 250 MG tablet; Take 1 tablet (250 mg total) by mouth daily.  Dispense: 84 tablet; Refill: 0  2. Hyperlipidemia, unspecified hyperlipidemia type - Comprehensive metabolic panel - TSH - Lipid Panel - simvastatin (ZOCOR) 40 MG tablet; TAKE 1 TABLET BY MOUTH DAILY AT 6 PM.  Dispense: 90 tablet; Refill:  1  3. High risk medication use - Comprehensive metabolic panel - TSH - CBC with Differential - Lipid Panel  4. Essential hypertension - Blood pressure a little low today, discussed decreasing lisinopril but patient concerned about HTN. Discussed increasing fluid intake, he will try to have checked outside of office. If any dizziness or lightheadedness he will reduce lisinopril to 1/2 tab - Comprehensive metabolic panel - lisinopril (PRINIVIL,ZESTRIL) 20 MG tablet; Take 1 tablet (20 mg total) by mouth daily.  Dispense: 90 tablet; Refill: 1  5. Screening for colon cancer - Ambulatory referral to Gastroenterology  6. Gastroesophageal reflux disease, esophagitis presence not specified - pantoprazole (PROTONIX) 40 MG tablet; Take 1 tablet (40 mg total) by mouth daily.  Dispense: 90 tablet; Refill: 1  7. Mild intermittent reactive airway disease without complication - requests refill, has been out, occasionally needs for wheeze/cough - albuterol (PROAIR HFA) 108 (90 Base) MCG/ACT inhaler; Inhale 1-2 puffs into the lungs every 6 (six) hours as needed for wheezing or shortness of breath.  Dispense: 1 Inhaler; Refill: [place], FNP-BC  [facility] at [facility], [facility]  04/09/2018 10:34 PM

## 2018-04-22 DIAGNOSIS — N401 Enlarged prostate with lower urinary tract symptoms: Secondary | ICD-10-CM | POA: Diagnosis not present

## 2018-04-22 DIAGNOSIS — E291 Testicular hypofunction: Secondary | ICD-10-CM | POA: Diagnosis not present

## 2018-04-22 DIAGNOSIS — R351 Nocturia: Secondary | ICD-10-CM | POA: Diagnosis not present

## 2018-05-08 DIAGNOSIS — Z79891 Long term (current) use of opiate analgesic: Secondary | ICD-10-CM | POA: Diagnosis not present

## 2018-05-08 DIAGNOSIS — G894 Chronic pain syndrome: Secondary | ICD-10-CM | POA: Diagnosis not present

## 2018-05-29 ENCOUNTER — Other Ambulatory Visit: Payer: Self-pay | Admitting: *Deleted

## 2018-05-29 ENCOUNTER — Ambulatory Visit (AMBULATORY_SURGERY_CENTER): Payer: Self-pay | Admitting: *Deleted

## 2018-05-29 VITALS — Ht 69.0 in | Wt 223.0 lb

## 2018-05-29 DIAGNOSIS — Z1211 Encounter for screening for malignant neoplasm of colon: Secondary | ICD-10-CM

## 2018-05-29 MED ORDER — PEG-KCL-NACL-NASULF-NA ASC-C 140 G PO SOLR: 1.0000 | Freq: Once | ORAL | 0 refills | Status: AC

## 2018-05-29 NOTE — Progress Notes (Signed)
Denies allergies to eggs or soy products. Denies complications with sedation or anesthesia. Denies O2 use. Denies use of diet or weight loss medications.  Emmi instructions given for colonoscopy.  Pt with hx of cervical fusion. Elizabeth Palau, CRNA evaluated patient for appropriateness of completing colonoscopy at [facility]. Prior anesthesia records reviewed and pt evaluated. Pt will need to be done at [facility]. Screening colonoscopy scheduled at [facility] 07/15/18 at 1045 am by Marisue Humble.

## 2018-06-06 DIAGNOSIS — L918 Other hypertrophic disorders of the skin: Secondary | ICD-10-CM | POA: Diagnosis not present

## 2018-06-06 DIAGNOSIS — D2372 Other benign neoplasm of skin of left lower limb, including hip: Secondary | ICD-10-CM | POA: Diagnosis not present

## 2018-06-06 DIAGNOSIS — D2272 Melanocytic nevi of left lower limb, including hip: Secondary | ICD-10-CM | POA: Diagnosis not present

## 2018-06-06 DIAGNOSIS — D485 Neoplasm of uncertain behavior of skin: Secondary | ICD-10-CM | POA: Diagnosis not present

## 2018-06-06 DIAGNOSIS — D225 Melanocytic nevi of trunk: Secondary | ICD-10-CM | POA: Diagnosis not present

## 2018-06-06 DIAGNOSIS — L814 Other melanin hyperpigmentation: Secondary | ICD-10-CM | POA: Diagnosis not present

## 2018-06-11 DIAGNOSIS — M546 Pain in thoracic spine: Secondary | ICD-10-CM | POA: Diagnosis not present

## 2018-06-11 DIAGNOSIS — M9902 Segmental and somatic dysfunction of thoracic region: Secondary | ICD-10-CM | POA: Diagnosis not present

## 2018-06-11 DIAGNOSIS — M5414 Radiculopathy, thoracic region: Secondary | ICD-10-CM | POA: Diagnosis not present

## 2018-06-12 ENCOUNTER — Encounter: Payer: BLUE CROSS/BLUE SHIELD | Admitting: Gastroenterology

## 2018-06-13 DIAGNOSIS — M5414 Radiculopathy, thoracic region: Secondary | ICD-10-CM | POA: Diagnosis not present

## 2018-06-13 DIAGNOSIS — M9902 Segmental and somatic dysfunction of thoracic region: Secondary | ICD-10-CM | POA: Diagnosis not present

## 2018-06-13 DIAGNOSIS — M546 Pain in thoracic spine: Secondary | ICD-10-CM | POA: Diagnosis not present

## 2018-07-09 ENCOUNTER — Telehealth: Payer: Self-pay | Admitting: Internal Medicine

## 2018-07-09 NOTE — Telephone Encounter (Signed)
Spoke with pt regarding getting the Plenvu prep. The pt will come by the office tomorrow to pick up a sample of Plenvu, (lot #[id number],Exp 06/2019) which will be left at the front desk on the 3rd floor. Reviewed patients instructions and answered his questions. He will call back if he has any other questions or problems. Gwyndolyn Saxon

## 2018-07-15 ENCOUNTER — Encounter (HOSPITAL_COMMUNITY)
Admission: RE | Disposition: A | Discharge status: Home / Self Care | Payer: Self-pay | Source: Ambulatory Visit | Attending: Internal Medicine

## 2018-07-15 ENCOUNTER — Encounter (HOSPITAL_COMMUNITY): Payer: Self-pay | Admitting: *Deleted

## 2018-07-15 ENCOUNTER — Ambulatory Visit (HOSPITAL_COMMUNITY)
Admission: RE | Admit: 2018-07-15 | Discharge: 2018-07-15 | Disposition: A | Discharge status: Home / Self Care | Payer: BLUE CROSS/BLUE SHIELD | Source: Ambulatory Visit | Attending: Internal Medicine | Admitting: Internal Medicine

## 2018-07-15 ENCOUNTER — Ambulatory Visit (HOSPITAL_COMMUNITY): Payer: BLUE CROSS/BLUE SHIELD | Admitting: Anesthesiology

## 2018-07-15 ENCOUNTER — Other Ambulatory Visit: Payer: Self-pay

## 2018-07-15 DIAGNOSIS — D122 Benign neoplasm of ascending colon: Secondary | ICD-10-CM | POA: Diagnosis not present

## 2018-07-15 DIAGNOSIS — M1990 Unspecified osteoarthritis, unspecified site: Secondary | ICD-10-CM | POA: Insufficient documentation

## 2018-07-15 DIAGNOSIS — K219 Gastro-esophageal reflux disease without esophagitis: Secondary | ICD-10-CM | POA: Insufficient documentation

## 2018-07-15 DIAGNOSIS — G4730 Sleep apnea, unspecified: Secondary | ICD-10-CM | POA: Diagnosis not present

## 2018-07-15 DIAGNOSIS — K635 Polyp of colon: Secondary | ICD-10-CM | POA: Diagnosis not present

## 2018-07-15 DIAGNOSIS — E785 Hyperlipidemia, unspecified: Secondary | ICD-10-CM | POA: Diagnosis not present

## 2018-07-15 DIAGNOSIS — F319 Bipolar disorder, unspecified: Secondary | ICD-10-CM | POA: Diagnosis not present

## 2018-07-15 DIAGNOSIS — I10 Essential (primary) hypertension: Secondary | ICD-10-CM | POA: Diagnosis not present

## 2018-07-15 DIAGNOSIS — F419 Anxiety disorder, unspecified: Secondary | ICD-10-CM | POA: Diagnosis not present

## 2018-07-15 DIAGNOSIS — Z1211 Encounter for screening for malignant neoplasm of colon: Secondary | ICD-10-CM

## 2018-07-15 DIAGNOSIS — K644 Residual hemorrhoidal skin tags: Secondary | ICD-10-CM | POA: Insufficient documentation

## 2018-07-15 DIAGNOSIS — F17290 Nicotine dependence, other tobacco product, uncomplicated: Secondary | ICD-10-CM | POA: Diagnosis not present

## 2018-07-15 DIAGNOSIS — Z79899 Other long term (current) drug therapy: Secondary | ICD-10-CM | POA: Insufficient documentation

## 2018-07-15 HISTORY — PX: COLONOSCOPY WITH PROPOFOL: SHX5780

## 2018-07-15 HISTORY — PX: BIOPSY: SHX5522

## 2018-07-15 SURGERY — COLONOSCOPY WITH PROPOFOL
Anesthesia: Monitor Anesthesia Care

## 2018-07-15 MED ORDER — PROPOFOL 10 MG/ML IV BOLUS
INTRAVENOUS | Status: DC | PRN
  Administered 2018-07-15 (×6): 50 mg via INTRAVENOUS

## 2018-07-15 MED ORDER — SODIUM CHLORIDE 0.9 % IV SOLN: INTRAVENOUS | Status: DC

## 2018-07-15 MED ORDER — LACTATED RINGERS IV SOLN
INTRAVENOUS | Status: DC
  Administered 2018-07-15: 10:00:00 via INTRAVENOUS

## 2018-07-15 MED ORDER — LIDOCAINE 2% (20 MG/ML) 5 ML SYRINGE
INTRAMUSCULAR | Status: DC | PRN
  Administered 2018-07-15: 60 mg via INTRAVENOUS

## 2018-07-15 SURGICAL SUPPLY — 22 items

## 2018-07-15 NOTE — H&P (Signed)
[facility] History and Physical   Primary Care Physician:  Elby Beck, FNP   Reason for Procedure:   colon cancer screening   Plan:    Colonoscopy The risks and benefits as well as alternatives of endoscopic procedure(s) have been discussed and reviewed. All questions answered. The patient agrees to proceed.   HPI: [name] is a 52 y.o. male here for a screening colonoscopy No active GIsxs except does have sxs of anal skin tags, hemorrhoids swelling at times   Past Medical History:  Diagnosis Date  . Anxiety   . Arthritis    degeneration of spine & scoliosis  . Bipolar disorder ([place])   . GERD (gastroesophageal reflux disease)   . H/O echocardiogram ?2015  . Hyperlipidemia   . Hypertension   . Pneumonia 2002   [place] admission   . Shortness of breath dyspnea   . Sleep apnea    test 3/15-waiting results-was told oxygen did drop- did additional study done at home, told that he doesn't have sleep apnea     Past Surgical History:  Procedure Laterality Date  . ANTERIOR CERVICAL DECOMP/DISCECTOMY FUSION N/A 08/17/2015   Procedure: ANTERIOR CERVICAL DECOMPRESSION/DISCECTOMY FUSION Cervical five-cervical seven. Two LEVELS;  Surgeon: Melina Schools, MD;  Location: [facility];  Service: Orthopedics;  Laterality: N/A;  . BRONCHOSCOPY    . MASS EXCISION Left 12/17/2013   Procedure: EXCISION MASS LEFT WRIST;  Surgeon: Tennis Must, MD;  Location: [facility];  Service: Orthopedics;  Laterality: Left;  . SEPTOPLASTY  1990    Prior to Admission medications   Medication Sig Start Date End Date Taking? Authorizing Provider  ALPRAZolam Duanne Moron) 1 MG tablet Take 1 mg by mouth 3 (three) times daily as needed for anxiety.    Yes [provider]  clonazePAM (KLONOPIN) 2 MG tablet Take 2 mg by mouth at bedtime.    Yes [provider]  diphenhydrAMINE (BENADRYL) 25 MG tablet Take 50 mg by mouth at bedtime.    Yes [provider]  LATUDA 120 MG TABS Take 120 mg by mouth daily after supper.  08/30/17  Yes [provider]  lisinopril (PRINIVIL,ZESTRIL) 20 MG tablet Take 1 tablet (20 mg total) by mouth daily. 04/09/18  Yes Elby Beck, FNP  methocarbamol (ROBAXIN) 500 MG tablet Take 1 tablet (500 mg total) by mouth 3 (three) times daily as needed for muscle spasms. Patient taking differently: Take 500 mg by mouth at bedtime.  08/17/15  Yes Melina Schools, MD  oxyCODONE-acetaminophen (PERCOCET) 10-325 MG tablet Take 1 tablet by mouth every 4 (four) hours as needed for pain. Patient taking differently: Take 1 tablet by mouth every 8 (eight) hours as needed for pain.  08/17/15  Yes Melina Schools, MD  pantoprazole (PROTONIX) 40 MG tablet Take 1 tablet (40 mg total) by mouth daily. Patient taking differently: Take 40 mg by mouth daily as needed (acid reflux/indigestion).  04/09/18  Yes Elby Beck, FNP  QUEtiapine (SEROQUEL) 300 MG tablet Take 450 mg by mouth at bedtime.    Yes [provider]  sertraline (ZOLOFT) 100 MG tablet Take 100 mg by mouth daily.    Yes [provider]  sildenafil (REVATIO) 20 MG tablet Take 40-100 mg by mouth daily as needed (for erectile dysfunction.).    Yes [provider]  simvastatin (ZOCOR) 40 MG tablet TAKE 1 TABLET BY MOUTH DAILY AT 6 PM. Patient taking differently: Take 40 mg by mouth every evening.  04/09/18  Yes Elby Beck, FNP  tamsulosin (FLOMAX) 0.4 MG CAPS capsule Take 0.8 mg by mouth at bedtime.    Yes [provider]  terbinafine (LAMISIL) 250 MG tablet Take 1 tablet (250 mg total) by mouth daily. Patient taking differently: Take 250 mg by mouth every morning.  04/09/18  Yes Elby Beck, FNP  zolpidem (AMBIEN) 10 MG tablet Take 10 mg by mouth at bedtime.  09/16/17  Yes [provider]  albuterol (PROAIR HFA) 108 (90 Base) MCG/ACT inhaler Inhale 1-2 puffs into the lungs every 6 (six) hours as needed for  wheezing or shortness of breath. 04/09/18   Elby Beck, FNP    Current Facility-Administered Medications  Medication Dose Route Frequency Provider Last Rate Last Dose  . lactated ringers infusion   Intravenous Continuous Gatha Mayer, MD 20 mL/hr at 07/15/18 1696      Allergies as of 05/29/2018  . (No Known Allergies)    Family History  Problem Relation Age of Onset  . Cancer Mother        melanoma  . Depression Other   . Cancer Other        skin  . Colon polyps Neg Hx   . Esophageal cancer Neg Hx   . Rectal cancer Neg Hx   . Stomach cancer Neg Hx     Social History   Social History Narrative   Married, self-employed   Vapes   No EtOH since 2007, no drug use     Review of Systems:  All other review of systems negative except as mentioned in the HPI.  Physical Exam: Vital signs in last 24 hours: Temp:  [97.7 F (36.5 C)] 97.7 F (36.5 C) (10/22 0940) Pulse Rate:  [99] 99 (10/22 0940) Resp:  [14] 14 (10/22 0940) BP: (123)/(85) 123/85 (10/22 0940) SpO2:  [98 %] 98 % (10/22 0940) Weight:  [99.8 kg] 99.8 kg (10/22 0940)   General:   Alert,  Well-developed, well-nourished, pleasant and cooperative in NAD Lungs:  Clear throughout to auscultation.   Heart:  Regular rate and rhythm; no murmurs, clicks, rubs,  or gallops. Abdomen:  Soft, nontender and nondistended. Normal bowel sounds.   Neuro/Psych:  Alert and cooperative. Normal mood and affect. A and O x 3   @Rodriquez Thorner  Simonne Maffucci, MD, [facility] [phone] (pager) 07/15/2018 10:08 AM@

## 2018-07-15 NOTE — Anesthesia Preprocedure Evaluation (Addendum)
Anesthesia Evaluation  Patient identified by MRN, date of birth, ID band Patient awake    Reviewed: Allergy & Precautions, NPO status , Patient's Chart, lab work & pertinent test results  Airway Mallampati: I  TM Distance: >3 FB Neck ROM: Full    Dental no notable dental hx. (+) Teeth Intact, Dental Advisory Given   Pulmonary Current Smoker,    Pulmonary exam normal breath sounds clear to auscultation       Cardiovascular hypertension, Normal cardiovascular exam Rhythm:Regular Rate:Normal     Neuro/Psych PSYCHIATRIC DISORDERS Anxiety Bipolar Disorder    GI/Hepatic Neg liver ROS, GERD  Medicated and Controlled,  Endo/Other  negative endocrine ROS  Renal/GU negative Renal ROS     Musculoskeletal  (+) Arthritis , Osteoarthritis,    Abdominal   Peds  Hematology negative hematology ROS (+)   Anesthesia Other Findings CRC screening  Reproductive/Obstetrics                           Anesthesia Physical Anesthesia Plan  ASA: II  Anesthesia Plan: MAC   Post-op Pain Management:    Induction: Intravenous  PONV Risk Score and Plan: 0 and Treatment may vary due to age or medical condition and Propofol infusion  Airway Management Planned: Natural Airway and Simple Face Mask  Additional Equipment:   Intra-op Plan:   Post-operative Plan:   Informed Consent: I have reviewed the patients History and Physical, chart, labs and discussed the procedure including the risks, benefits and alternatives for the proposed anesthesia with the patient or authorized representative who has indicated his/her understanding and acceptance.   Dental advisory given  Plan Discussed with: CRNA  Anesthesia Plan Comments:       Anesthesia Quick Evaluation

## 2018-07-15 NOTE — Discharge Instructions (Signed)
° °  I found and removed one tiny polyp. I will let you know pathology results and when to have another routine colonoscopy by mail and/or My Chart.  All else ok.  You do sound like you have symptomatic hemorrhoids so I recommend you come see me in the office about fixing them.  Try looking at [facility] hemorrhoid banding on line and you can see what I do.  [email or web address]   I appreciate the opportunity to care for you. Gatha Mayer, MD, [facility]

## 2018-07-15 NOTE — Op Note (Addendum)
[facility] Patient Name: [name] Procedure Date: 07/15/2018 MRN: [id number] Attending MD: Gatha Mayer , MD Date of Birth: November 20, 1965 CSN: [id number] Age: 52 Admit Type: Inpatient Procedure:                Colonoscopy Indications:              Screening for colorectal malignant neoplasm, This                            is the patient's first colonoscopy Providers:                Gatha Mayer, MD, Cleda Daub, RN, Alan Mulder, Technician, Glenis Smoker, CRNA Referring MD:              Medicines:                Propofol per Anesthesia, Monitored Anesthesia Care Complications:            No immediate complications. Estimated Blood Loss:     Estimated blood loss was minimal. Procedure:                Pre-Anesthesia Assessment:                           - Prior to the procedure, a History and Physical                            was performed, and patient medications and                            allergies were reviewed. The patient's tolerance of                            previous anesthesia was also reviewed. The risks                            and benefits of the procedure and the sedation                            options and risks were discussed with the patient.                            All questions were answered, and informed consent                            was obtained. Prior Anticoagulants: The patient has                            taken no previous anticoagulant or antiplatelet                            agents. ASA Grade Assessment: II - A patient with  mild systemic disease. After reviewing the risks                            and benefits, the patient was deemed in                            satisfactory condition to undergo the procedure.                           After obtaining informed consent, the colonoscope                            was passed under direct vision. Throughout  the                            procedure, the patient's blood pressure, Carroll, and                            oxygen saturations were monitored continuously. The                            CF-HQ190L ([id number]) Olympus adult colonoscope was                            introduced through the anus and advanced to the the                            cecum, identified by appendiceal orifice and                            ileocecal valve. The colonoscopy was performed                            without difficulty. The patient tolerated the                            procedure well. The quality of the bowel                            preparation was good. The ileocecal valve,                            appendiceal orifice, and rectum were photographed. Scope In: 10:22:20 AM Scope Out: 10:37:54 AM Scope Withdrawal Time: 0 hours 14 minutes 34 seconds  Total Procedure Duration: 0 hours 15 minutes 34 seconds  Findings:      A 1 to 2 mm polyp was found in the ascending colon. The polyp was       sessile. The polyp was removed with a cold biopsy forceps. Resection and       retrieval were complete. Verification of patient identification for the       specimen was done. Estimated blood loss was minimal.      The exam was otherwise without abnormality on direct and retroflexion       views. Impression:               -  One 1 to 2 mm polyp in the ascending colon,                            removed with a cold biopsy forceps. Resected and                            retrieved.                           - The examination was otherwise normal on direct                            and retroflexion views. Moderate Sedation:      N/A- Per Anesthesia Care Recommendation:           - Patient has a contact number available for                            emergencies. The signs and symptoms of potential                            delayed complications were discussed with the                            patient. Return  to normal activities tomorrow.                            Written discharge instructions were provided to the                            patient.                           - Resume previous diet.                           - Continue present medications.                           - Repeat colonoscopy is recommended. The                            colonoscopy date will be determined after pathology                            results from today's exam become available for                            review.                           He requires hospital setting due to prior C spine                            surgery and airway changes.  He will call the office for appointment for what                            sounds like prolapsing internal hemorrhoids (not                            prominent on this exam with insufflation but hx                            very consistent) Procedure Code(s):        --- Professional ---                           [phone], Colonoscopy, flexible; with biopsy, single                            or multiple Diagnosis Code(s):        --- Professional ---                           Z12.11, Encounter for screening for malignant                            neoplasm of colon                           D12.2, Benign neoplasm of ascending colon CPT copyright 2018 American Medical Association. All rights reserved. The codes documented in this report are preliminary and upon coder review may  be revised to meet current compliance requirements. Gatha Mayer, MD 07/15/2018 10:42:32 AM This report has been signed electronically. Number of Addenda: 0

## 2018-07-15 NOTE — Anesthesia Postprocedure Evaluation (Signed)
Anesthesia Post Note  Patient: [name]  Procedure(s) Performed: COLONOSCOPY WITH PROPOFOL (N/A ) BIOPSY     Patient location during evaluation: Endoscopy Anesthesia Type: MAC Level of consciousness: awake and alert Pain management: pain level controlled Vital Signs Assessment: post-procedure vital signs reviewed and stable Respiratory status: spontaneous breathing, nonlabored ventilation, respiratory function stable and patient connected to nasal cannula oxygen Cardiovascular status: blood pressure returned to baseline and stable Postop Assessment: no apparent nausea or vomiting Anesthetic complications: no    Last Vitals:  Vitals:   07/15/18 1050 07/15/18 1100  BP: 121/85 123/83  Pulse: 92 85  Resp: (!) 25 (!) 22  Temp:    SpO2: 100% 99%    Last Pain:  Vitals:   07/15/18 0940  TempSrc: Oral  PainSc: 7                  Amisha Pospisil L Kearie Mennen

## 2018-07-15 NOTE — Anesthesia Procedure Notes (Signed)
Procedure Name: MAC Date/Time: 07/15/2018 10:15 AM Performed by: Cynda Familia, CRNA Pre-anesthesia Checklist: Patient identified, Emergency Drugs available, Suction available, Patient being monitored and Timeout performed Patient Re-evaluated:Patient Re-evaluated prior to induction Oxygen Delivery Method: Simple face mask Placement Confirmation: positive ETCO2 and breath sounds checked- equal and bilateral Dental Injury: Teeth and Oropharynx as per pre-operative assessment

## 2018-07-15 NOTE — Transfer of Care (Signed)
Immediate Anesthesia Transfer of Care Note  Patient: [name]  Procedure(s) Performed: COLONOSCOPY WITH PROPOFOL (N/A ) BIOPSY  Patient Location: PACU and Endoscopy Unit  Anesthesia Type:MAC  Level of Consciousness: awake and alert   Airway & Oxygen Therapy: Patient Spontanous Breathing and Patient connected to face mask oxygen  Post-op Assessment: Report given to RN and Post -op Vital signs reviewed and stable  Post vital signs: Reviewed and stable  Last Vitals:  Vitals Value Taken Time  BP    Temp    Pulse 93 07/15/2018 10:42 AM  Resp 23 07/15/2018 10:42 AM  SpO2 100 % 07/15/2018 10:42 AM  Vitals shown include unvalidated device data.  Last Pain:  Vitals:   07/15/18 0940  TempSrc: Oral  PainSc: 7          Complications: No apparent anesthesia complications

## 2018-07-16 ENCOUNTER — Encounter (HOSPITAL_COMMUNITY): Payer: Self-pay | Admitting: Internal Medicine

## 2018-07-21 ENCOUNTER — Encounter: Payer: Self-pay | Admitting: Internal Medicine

## 2018-07-21 NOTE — Progress Notes (Signed)
Polyp not pre-cancerous 10 year 2029 colon recall  My Chart letter sent

## 2018-08-19 DIAGNOSIS — F3174 Bipolar disorder, in full remission, most recent episode manic: Secondary | ICD-10-CM | POA: Diagnosis not present

## 2018-08-19 DIAGNOSIS — F3131 Bipolar disorder, current episode depressed, mild: Secondary | ICD-10-CM | POA: Diagnosis not present

## 2018-08-19 DIAGNOSIS — F410 Panic disorder [episodic paroxysmal anxiety] without agoraphobia: Secondary | ICD-10-CM | POA: Diagnosis not present

## 2018-09-03 DIAGNOSIS — M546 Pain in thoracic spine: Secondary | ICD-10-CM | POA: Diagnosis not present

## 2018-09-03 DIAGNOSIS — Z79891 Long term (current) use of opiate analgesic: Secondary | ICD-10-CM | POA: Diagnosis not present

## 2018-09-03 DIAGNOSIS — M5030 Other cervical disc degeneration, unspecified cervical region: Secondary | ICD-10-CM | POA: Diagnosis not present

## 2018-09-03 DIAGNOSIS — M545 Low back pain: Secondary | ICD-10-CM | POA: Diagnosis not present

## 2018-09-08 DIAGNOSIS — E291 Testicular hypofunction: Secondary | ICD-10-CM | POA: Diagnosis not present

## 2018-10-05 ENCOUNTER — Other Ambulatory Visit: Payer: Self-pay | Admitting: Family Medicine

## 2018-10-05 DIAGNOSIS — I10 Essential (primary) hypertension: Secondary | ICD-10-CM

## 2018-10-05 NOTE — Progress Notes (Signed)
Labs entered for cpe

## 2018-10-09 ENCOUNTER — Other Ambulatory Visit: Payer: BLUE CROSS/BLUE SHIELD

## 2018-10-10 ENCOUNTER — Other Ambulatory Visit (INDEPENDENT_AMBULATORY_CARE_PROVIDER_SITE_OTHER): Payer: BLUE CROSS/BLUE SHIELD

## 2018-10-10 DIAGNOSIS — I10 Essential (primary) hypertension: Secondary | ICD-10-CM | POA: Diagnosis not present

## 2018-10-10 LAB — BASIC METABOLIC PANEL
BUN: 10 mg/dL (ref 6–23)
CHLORIDE: 99 meq/L (ref 96–112)
CO2: 27 mEq/L (ref 19–32)
CREATININE: 1.19 mg/dL (ref 0.40–1.50)
Calcium: 9.5 mg/dL (ref 8.4–10.5)
GFR: 64.53 mL/min (ref >60.00)
Glucose, Bld: 121 mg/dL — ABNORMAL HIGH (ref 70–99)
POTASSIUM: 4.2 meq/L (ref 3.5–5.1)
Sodium: 136 mEq/L (ref 135–145)

## 2018-10-13 ENCOUNTER — Other Ambulatory Visit: Payer: Self-pay | Admitting: Family Medicine

## 2018-10-13 ENCOUNTER — Encounter: Payer: BLUE CROSS/BLUE SHIELD | Admitting: Family Medicine

## 2018-10-13 DIAGNOSIS — E785 Hyperlipidemia, unspecified: Secondary | ICD-10-CM

## 2018-10-18 ENCOUNTER — Other Ambulatory Visit: Payer: Self-pay | Admitting: Family Medicine

## 2018-10-18 DIAGNOSIS — I10 Essential (primary) hypertension: Secondary | ICD-10-CM

## 2018-10-18 DIAGNOSIS — K219 Gastro-esophageal reflux disease without esophagitis: Secondary | ICD-10-CM

## 2018-10-20 DIAGNOSIS — E291 Testicular hypofunction: Secondary | ICD-10-CM | POA: Diagnosis not present

## 2018-10-21 DIAGNOSIS — G8929 Other chronic pain: Secondary | ICD-10-CM | POA: Diagnosis not present

## 2018-10-21 DIAGNOSIS — M546 Pain in thoracic spine: Secondary | ICD-10-CM | POA: Diagnosis not present

## 2018-10-24 ENCOUNTER — Encounter: Payer: Self-pay | Admitting: Family Medicine

## 2018-10-24 ENCOUNTER — Ambulatory Visit (INDEPENDENT_AMBULATORY_CARE_PROVIDER_SITE_OTHER): Payer: BLUE CROSS/BLUE SHIELD | Admitting: Family Medicine

## 2018-10-24 VITALS — BP 112/76 | HR 97 | Temp 97.6°F | Ht 68.75 in | Wt 224.0 lb

## 2018-10-24 DIAGNOSIS — Z0000 Encounter for general adult medical examination without abnormal findings: Secondary | ICD-10-CM | POA: Diagnosis not present

## 2018-10-24 DIAGNOSIS — Z23 Encounter for immunization: Secondary | ICD-10-CM | POA: Diagnosis not present

## 2018-10-24 DIAGNOSIS — R7309 Other abnormal glucose: Secondary | ICD-10-CM

## 2018-10-24 DIAGNOSIS — Z720 Tobacco use: Secondary | ICD-10-CM

## 2018-10-24 DIAGNOSIS — G4701 Insomnia due to medical condition: Secondary | ICD-10-CM | POA: Diagnosis not present

## 2018-10-24 LAB — POCT GLYCOSYLATED HEMOGLOBIN (HGB A1C): HEMOGLOBIN A1C: 5.4 % (ref 4.0–5.6)

## 2018-10-24 NOTE — Progress Notes (Signed)
Subjective:    Patient ID: [name], male    DOB: 1966-08-13, 53 y.o.   MRN: [id number]  HPI This is a 53 yo male who presents today for CPE. Has been doing ok. No progress on disability claim.   Last CPE- last year PSA- followed by urology Colonoscopy- 2019 Tdap- due, will have today Flu- today Dental- overdue Eye- unsure Exercise- some walking Sleep- poor, shoulder pain worse at night. Had sleep study in past, negative.   Chronic back pain, seeing neurosurgery for second opinion.   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   Past Medical History:  Diagnosis Date  . Anxiety   . Arthritis    degeneration of spine & scoliosis  . Bipolar disorder ([place])   . GERD (gastroesophageal reflux disease)   . H/O echocardiogram ?2015  . Hyperlipidemia   . Hypertension   . Pneumonia 2002   [place] admission   . Shortness of breath dyspnea   . Sleep apnea    test 3/15-waiting results-was told oxygen did drop- did additional study done at home, told that he doesn't have sleep apnea    Past Surgical History:  Procedure Laterality Date  . ANTERIOR CERVICAL DECOMP/DISCECTOMY FUSION N/A 08/17/2015   Procedure: ANTERIOR CERVICAL DECOMPRESSION/DISCECTOMY FUSION Cervical five-cervical seven. Two LEVELS;  Surgeon: Melina Schools, MD;  Location: [place];  Service: Orthopedics;  Laterality: N/A;  . BIOPSY  07/15/2018   Procedure: BIOPSY;  Surgeon: Gatha Mayer, MD;  Location: [facility];  Service: Endoscopy;;  . BRONCHOSCOPY    . COLONOSCOPY WITH PROPOFOL N/A 07/15/2018   Procedure: COLONOSCOPY WITH PROPOFOL;  Surgeon: Gatha Mayer, MD;  Location: [facility];  Service: Endoscopy;  Laterality: N/A;  . MASS EXCISION Left 12/17/2013   Procedure: EXCISION MASS LEFT WRIST;  Surgeon: Tennis Must, MD;  Location: [place];  Service: Orthopedics;  Laterality: Left;  . SEPTOPLASTY  1990   Family History  Problem Relation  Age of Onset  . Cancer Mother        melanoma  . Depression Other   . Cancer Other        skin  . Colon polyps Neg Hx   . Esophageal cancer Neg Hx   . Rectal cancer Neg Hx   . Stomach cancer Neg Hx    Social History   Tobacco Use  . Smoking status: Current Every Day Smoker    Packs/day: 0.50  . Smokeless tobacco: Former Systems developer  . Tobacco comment: vaping  Substance Use Topics  . Alcohol use: No    Alcohol/week: 0.0 standard drinks    Comment: quit 2007  . Drug use: No    Comment: history cocaine-last 2006     Review of Systems  Constitutional: Negative.   HENT: Negative.   Eyes: Negative.   Respiratory: Positive for shortness of breath. Negative for chest tightness. Wheezing: occasional, vaping instead of cigarettes.   Cardiovascular: Negative for chest pain, palpitations and leg swelling.  Gastrointestinal: Positive for abdominal pain (occasional cramping) and diarrhea (3 loose stools daily). Negative for blood in stool and constipation.  Endocrine: Negative.   Genitourinary: Negative.   Musculoskeletal: Positive for back pain.  Skin: Negative.   Allergic/Immunologic: Negative.   Neurological: Negative.   Hematological: Negative.   Psychiatric/Behavioral: Positive for dysphoric mood and sleep disturbance. Negative for suicidal ideas. The patient is not nervous/anxious.        Objective:   Physical  Exam Vitals signs reviewed.  Constitutional:      General: He is not in acute distress.    Appearance: Normal appearance. He is obese. He is not ill-appearing, toxic-appearing or diaphoretic.  HENT:     Head: Normocephalic and atraumatic.     Nose: Nose normal.     Mouth/Throat:     Mouth: Mucous membranes are moist.     Pharynx: Oropharynx is clear.  Eyes:     Conjunctiva/sclera: Conjunctivae normal.  Neck:     Musculoskeletal: Normal range of motion and neck supple.  Cardiovascular:     Rate and Rhythm: Normal rate and regular rhythm.     Heart sounds: Normal  heart sounds.  Pulmonary:     Effort: Pulmonary effort is normal.     Breath sounds: Normal breath sounds.  Abdominal:     General: Bowel sounds are normal.     Palpations: Abdomen is soft. There is no mass.     Tenderness: There is no abdominal tenderness. There is no guarding or rebound.     Hernia: No hernia is present.  Neurological:     Mental Status: He is alert and oriented to person, place, and time.  Psychiatric:        Mood and Affect: Mood normal.        Behavior: Behavior normal.        Thought Content: Thought content normal.        Judgment: Judgment normal.      BP 112/76 (BP Location: Right Arm, Patient Position: Sitting, Cuff Size: Large)   Pulse 97   Temp 97.6 F (36.4 C) (Oral)   Ht 5' 8.75" (1.746 m)   Wt 224 lb (101.6 kg)   SpO2 98%   BMI 33.32 kg/m  Wt Readings from Last 3 Encounters:  10/24/18 224 lb (101.6 kg)  07/15/18 220 lb (99.8 kg)  05/29/18 223 lb (101.2 kg)    Depression screen [facility] 2/9 10/24/2018 10/09/2017  Decreased Interest 3 3  Down, Depressed, Hopeless 3 3  PHQ - 2 Score 6 6  Altered sleeping 3 3  Tired, decreased energy 3 3  Change in appetite 0 3  Feeling bad or failure about yourself  0 2  Trouble concentrating 0 2  Moving slowly or fidgety/restless 2 3  Suicidal thoughts 0 0  PHQ-9 Score 14 22  Difficult doing work/chores Somewhat difficult -   Results for orders placed or performed in visit on 10/24/18  HgB A1c  Result Value Ref Range   Hemoglobin A1C 5.4 4.0 - 5.6 %   HbA1c POC (<> result, manual entry)     HbA1c, POC (prediabetic range)     HbA1c, POC (controlled diabetic range)         Assessment & Plan:  1. Annual physical exam - Discussed and encouraged healthy lifestyle choices- adequate sleep, regular exercise, stress management and healthy food choices.    2. Elevated glucose - HgB A1c- normal, patient notified in office  3. Need for influenza vaccination - Flu Vaccine QUAD 6+ mos PF IM (Fluarix Quad  PF)  4. Need for Tdap vaccination - Tdap vaccine greater than or equal to 7yo IM  5. Current nicotine use - encouraged him to decrease nicotine in vape  6. Insomnia due to medical condition - encouraged good sleep hygiene, decreased caffeine and nicotine  - follow up in 6 months  Clarene Reamer, FNP-BC   Primary Care at [facility], [place]  10/26/2018  6:58 PM

## 2018-10-24 NOTE — Patient Instructions (Addendum)
Good to see you today  Caffeine and nicotine are both stimulants, see if decreasing these helps with sleep and/or anxiety. Continue walking as tolerated.   Please follow up in 6 months  Keeping you healthy  Get these tests  Blood pressure- Have your blood pressure checked once a year by your healthcare provider.  Normal blood pressure is 120/80  Weight- Have your body mass index (BMI) calculated to screen for obesity.  BMI is a measure of body fat based on height and weight. You can also calculate your own BMI at ViewBanking.si.  Cholesterol- Have your cholesterol checked every year.  Diabetes- Have your blood sugar checked regularly if you have high blood pressure, high cholesterol, have a family history of diabetes or if you are overweight.  Screening for Colon Cancer- Colonoscopy starting at age 79.  Screening may begin sooner depending on your family history and other health conditions. Follow up colonoscopy as directed by your Gastroenterologist.  Screening for Prostate Cancer- Both blood work (PSA) and a rectal exam help screen for Prostate Cancer.  Screening begins at age 20 with African-American men and at age 68 with Caucasian men.  Screening may begin sooner depending on your family history.  Take these medicines  Aspirin- One aspirin daily can help prevent Heart disease and Stroke.  Flu shot- Every fall.  Tetanus- Every 10 years.  Zostavax- Once after the age of 26 to prevent Shingles.  Pneumonia shot- Once after the age of 19; if you are younger than 37, ask your healthcare provider if you need a Pneumonia shot.  Take these steps  Don't smoke- If you do smoke, talk to your doctor about quitting.  For tips on how to quit, go to www.smokefree.gov or call 1-800-QUIT-NOW.  Be physically active- Exercise 5 days a week for at least 30 minutes.  If you are not already physically active start slow and gradually work up to 30 minutes of moderate physical activity.   Examples of moderate activity include walking briskly, mowing the yard, dancing, swimming, bicycling, etc.  Eat a healthy diet- Eat a variety of healthy food such as fruits, vegetables, low fat milk, low fat cheese, yogurt, lean meant, poultry, fish, beans, tofu, etc. For more information go to www.thenutritionsource.org  Drink alcohol in moderation- Limit alcohol intake to less than two drinks a day. Never drink and drive.  Dentist- Brush and floss twice daily; visit your dentist twice a year.  Depression- Your emotional health is as important as your physical health. If you're feeling down, or losing interest in things you would normally enjoy please talk to your healthcare provider.  Eye exam- Visit your eye doctor every year.  Safe sex- If you may be exposed to a sexually transmitted infection, use a condom.  Seat belts- Seat belts can save your life; always wear one.  Smoke/Carbon Monoxide detectors- These detectors need to be installed on the appropriate level of your home.  Replace batteries at least once a year.  Skin cancer- When out in the sun, cover up and use sunscreen 15 SPF or higher.  Violence- If anyone is threatening you, please tell your healthcare provider.  Living Will/ Health care power of attorney- Speak with your healthcare provider and family.

## 2018-10-26 ENCOUNTER — Encounter: Payer: Self-pay | Admitting: Family Medicine

## 2018-10-26 DIAGNOSIS — G4701 Insomnia due to medical condition: Secondary | ICD-10-CM | POA: Insufficient documentation

## 2018-10-26 DIAGNOSIS — Z720 Tobacco use: Secondary | ICD-10-CM | POA: Insufficient documentation

## 2019-01-22 DIAGNOSIS — F3174 Bipolar disorder, in full remission, most recent episode manic: Secondary | ICD-10-CM | POA: Diagnosis not present

## 2019-01-22 DIAGNOSIS — F3132 Bipolar disorder, current episode depressed, moderate: Secondary | ICD-10-CM | POA: Diagnosis not present

## 2019-01-23 ENCOUNTER — Other Ambulatory Visit: Payer: Self-pay | Admitting: Family Medicine

## 2019-01-23 DIAGNOSIS — I10 Essential (primary) hypertension: Secondary | ICD-10-CM

## 2019-02-04 DIAGNOSIS — M546 Pain in thoracic spine: Secondary | ICD-10-CM | POA: Diagnosis not present

## 2019-02-12 DIAGNOSIS — G894 Chronic pain syndrome: Secondary | ICD-10-CM | POA: Diagnosis not present

## 2019-04-01 DIAGNOSIS — M546 Pain in thoracic spine: Secondary | ICD-10-CM | POA: Diagnosis not present

## 2019-04-24 ENCOUNTER — Ambulatory Visit: Payer: BLUE CROSS/BLUE SHIELD | Admitting: Family Medicine

## 2019-04-29 ENCOUNTER — Encounter: Payer: Self-pay | Admitting: Family Medicine

## 2019-04-29 ENCOUNTER — Ambulatory Visit (INDEPENDENT_AMBULATORY_CARE_PROVIDER_SITE_OTHER): Payer: BC Managed Care – PPO | Admitting: Family Medicine

## 2019-04-29 VITALS — Ht 68.75 in | Wt 225.0 lb

## 2019-04-29 DIAGNOSIS — K219 Gastro-esophageal reflux disease without esophagitis: Secondary | ICD-10-CM | POA: Diagnosis not present

## 2019-04-29 DIAGNOSIS — Z720 Tobacco use: Secondary | ICD-10-CM | POA: Diagnosis not present

## 2019-04-29 DIAGNOSIS — R0982 Postnasal drip: Secondary | ICD-10-CM | POA: Diagnosis not present

## 2019-04-29 MED ORDER — LANSOPRAZOLE 30 MG PO CPDR: DELAYED_RELEASE_CAPSULE | ORAL | 0 refills | Status: DC

## 2019-04-29 MED ORDER — FLUTICASONE PROPIONATE 50 MCG/ACT NA SUSP: 2.0000 | Freq: Every day | NASAL | 6 refills | Status: AC

## 2019-04-29 NOTE — Progress Notes (Signed)
Virtual Visit via Video Note  I connected with [name] on 04/29/19 at  9:00 AM EDT by a video enabled telemedicine application and verified that I am speaking with the correct person using two identifiers.  Location: Patient: in his home Provider: [place]   I discussed the limitations of evaluation and management by telemedicine and the availability of in person appointments. The patient expressed understanding and agreed to proceed.  History of Present Illness:   This is a 53 year old male who presents today for video visit to follow-up on chronic medical conditions.  Back pain-chronic, continues follow-up with Dr. Nelva Bush.  He still waiting on determination of his disability claim.  Depression-follows regularly with psychiatry and recently had medication change.  He was started on Lamictal about 6 weeks ago and he reports improvement in mood.  AM nausea-this is been going on for about 6 months.  He reports that he wakes up feeling nauseous with acid indigestion. When he drinks coffee he has vomiting.  Thinks it could be related to sinus drainage.  He takes diphenhydramine 50 mg at bedtime to help with sleep and sinus drainage, no long-lasting antihistamines or inhaled nasal steroids use currently.  He does occasionally use Afrin nasal spray.  He has tried Pepto-Bismol with a little relief.  He takes his pantoprazole 40 mg 1-2 times a day as needed.  Taking at least once a day daily.  He reports that he has been on pantoprazole for about 10 years.  He has noticed that caffeine, smoking, tomatoes, orange juice, and cucumbers exacerbate his symptoms.  He sleeps with his head elevated on a pillow but does not have the head of his bed elevated on blocks.  Past Medical History:  Diagnosis Date  . Anxiety   . Arthritis    degeneration of spine & scoliosis  . Bipolar disorder ([place])   . GERD (gastroesophageal reflux disease)   . H/O echocardiogram ?2015  . Hyperlipidemia   .  Hypertension   . Pneumonia 2002   [place] admission   . Shortness of breath dyspnea   . Sleep apnea    test 3/15-waiting results-was told oxygen did drop- did additional study done at home, told that he doesn't have sleep apnea    Past Surgical History:  Procedure Laterality Date  . ANTERIOR CERVICAL DECOMP/DISCECTOMY FUSION N/A 08/17/2015   Procedure: ANTERIOR CERVICAL DECOMPRESSION/DISCECTOMY FUSION Cervical five-cervical seven. Two LEVELS;  Surgeon: Melina Schools, MD;  Location: [place];  Service: Orthopedics;  Laterality: N/A;  . BIOPSY  07/15/2018   Procedure: BIOPSY;  Surgeon: Gatha Mayer, MD;  [facility];  Service: Endoscopy;;  . BRONCHOSCOPY    . COLONOSCOPY WITH PROPOFOL N/A 07/15/2018   Procedure: COLONOSCOPY WITH PROPOFOL;  Surgeon: Gatha Mayer, MD;  [facility];  Service: Endoscopy;  Laterality: N/A;  . MASS EXCISION Left 12/17/2013   Procedure: EXCISION MASS LEFT WRIST;  Surgeon: Tennis Must, MD;  Location: [place];  Service: Orthopedics;  Laterality: Left;  . SEPTOPLASTY  1990   Family History  Problem Relation Age of Onset  . Cancer Mother        melanoma  . Depression Other   . Cancer Other        skin  . Colon polyps Neg Hx   . Esophageal cancer Neg Hx   . Rectal cancer Neg Hx   . Stomach cancer Neg Hx    Social History   Tobacco Use  .  Smoking status: Current Every Day Smoker    Packs/day: 0.50  . Smokeless tobacco: Former Systems developer  . Tobacco comment: vaping  Substance Use Topics  . Alcohol use: No    Alcohol/week: 0.0 standard drinks    Comment: quit 2007  . Drug use: No    Comment: history cocaine-last 2006     Observations: Objective Patient is alert and answers questions appropriately.  Visible skin is unremarkable.  He is normally conversive without shortness of breath, audible wheeze or witnessed cough.  His mood and affect are appropriate.  Ht 5' 8.75" (1.746 m)   Wt 225 lb (102.1 kg)   BMI  33.47 kg/m  Wt Readings from Last 3 Encounters:  04/29/19 225 lb (102.1 kg)  10/24/18 224 lb (101.6 kg)  07/15/18 220 lb (99.8 kg)   Assessment and Plan: 1. Gastroesophageal reflux disease, esophagitis presence not specified -Discussed importance of trigger avoidance and encouraged weight loss and raising head of his bed -Additional information sent to patient via my chart and visit recap -We will change his PPI to lansoprazole twice daily x1 month then daily.  If no improvement will have him see GI for possible EGD. - lansoprazole (PREVACID) 30 MG capsule; Take 1 capsule by mouth 30 minutes before breakfast and dinner daily x 4 weeks, then take once a day before dinner.  Dispense: 120 capsule; Refill: 0 -Could also be exacerbated by several of his medications including benzodiazepines and opioid pain medication.  2. Post-nasal drainage - fluticasone (FLONASE) 50 MCG/ACT nasal spray; Place 2 sprays into both nostrils daily.  Dispense: 16 g; Refill: 6  3. Current nicotine use -Encouraged cessation or at least decrease.  Follow-up in 6 months   Clarene Reamer, FNP-BC  [place] [facility] at [facility], [place]  04/29/2019 10:07 AM  Follow Up Instructions:    I discussed the assessment and treatment plan with the patient. The patient was provided an opportunity to ask questions and all were answered. The patient agreed with the plan and demonstrated an understanding of the instructions.   The patient was advised to call back or seek an in-person evaluation if the symptoms worsen or if the condition fails to improve as anticipated.   Elby Beck, FNP

## 2019-05-01 ENCOUNTER — Other Ambulatory Visit: Payer: Self-pay | Admitting: Family Medicine

## 2019-05-01 DIAGNOSIS — I10 Essential (primary) hypertension: Secondary | ICD-10-CM

## 2019-05-14 DIAGNOSIS — E291 Testicular hypofunction: Secondary | ICD-10-CM | POA: Diagnosis not present

## 2019-05-14 DIAGNOSIS — N401 Enlarged prostate with lower urinary tract symptoms: Secondary | ICD-10-CM | POA: Diagnosis not present

## 2019-05-14 DIAGNOSIS — R351 Nocturia: Secondary | ICD-10-CM | POA: Diagnosis not present

## 2019-05-19 DIAGNOSIS — E291 Testicular hypofunction: Secondary | ICD-10-CM | POA: Diagnosis not present

## 2019-07-15 ENCOUNTER — Other Ambulatory Visit: Payer: Self-pay | Admitting: Family Medicine

## 2019-07-15 DIAGNOSIS — E785 Hyperlipidemia, unspecified: Secondary | ICD-10-CM

## 2019-07-23 DIAGNOSIS — F410 Panic disorder [episodic paroxysmal anxiety] without agoraphobia: Secondary | ICD-10-CM | POA: Diagnosis not present

## 2019-07-23 DIAGNOSIS — F3174 Bipolar disorder, in full remission, most recent episode manic: Secondary | ICD-10-CM | POA: Diagnosis not present

## 2019-07-23 DIAGNOSIS — F3176 Bipolar disorder, in full remission, most recent episode depressed: Secondary | ICD-10-CM | POA: Diagnosis not present

## 2019-07-31 ENCOUNTER — Other Ambulatory Visit: Payer: Self-pay | Admitting: *Deleted

## 2019-07-31 DIAGNOSIS — Z20822 Contact with and (suspected) exposure to covid-19: Secondary | ICD-10-CM

## 2019-08-02 LAB — NOVEL CORONAVIRUS, NAA: SARS-CoV-2, NAA: NOT DETECTED

## 2019-08-18 ENCOUNTER — Other Ambulatory Visit: Payer: Self-pay | Admitting: Family Medicine

## 2019-08-18 DIAGNOSIS — K219 Gastro-esophageal reflux disease without esophagitis: Secondary | ICD-10-CM

## 2019-10-15 ENCOUNTER — Other Ambulatory Visit: Payer: Self-pay | Admitting: Family Medicine

## 2019-10-15 DIAGNOSIS — E785 Hyperlipidemia, unspecified: Secondary | ICD-10-CM

## 2019-10-19 MED ORDER — SIMVASTATIN 40 MG PO TABS: ORAL_TABLET | ORAL | 0 refills | Status: DC

## 2019-10-19 NOTE — Addendum Note (Signed)
Addended by: Kris Mouton on: 10/19/2019 04:55 PM   Modules accepted: Orders

## 2019-10-19 NOTE — Telephone Encounter (Signed)
Patient made an appointment for 10/26/19

## 2019-10-26 ENCOUNTER — Ambulatory Visit: Payer: 59 | Admitting: Family Medicine

## 2019-11-20 ENCOUNTER — Other Ambulatory Visit: Payer: Self-pay

## 2019-11-20 ENCOUNTER — Encounter: Payer: Self-pay | Admitting: Family Medicine

## 2019-11-20 ENCOUNTER — Ambulatory Visit (INDEPENDENT_AMBULATORY_CARE_PROVIDER_SITE_OTHER): Payer: 59 | Admitting: Family Medicine

## 2019-11-20 VITALS — BP 110/88 | HR 98 | Temp 98.4°F | Resp 12 | Ht 68.75 in | Wt 212.2 lb

## 2019-11-20 DIAGNOSIS — Z125 Encounter for screening for malignant neoplasm of prostate: Secondary | ICD-10-CM

## 2019-11-20 DIAGNOSIS — I10 Essential (primary) hypertension: Secondary | ICD-10-CM

## 2019-11-20 DIAGNOSIS — Z0000 Encounter for general adult medical examination without abnormal findings: Secondary | ICD-10-CM | POA: Diagnosis not present

## 2019-11-20 DIAGNOSIS — K219 Gastro-esophageal reflux disease without esophagitis: Secondary | ICD-10-CM

## 2019-11-20 DIAGNOSIS — F17200 Nicotine dependence, unspecified, uncomplicated: Secondary | ICD-10-CM

## 2019-11-20 DIAGNOSIS — B351 Tinea unguium: Secondary | ICD-10-CM

## 2019-11-20 DIAGNOSIS — R1111 Vomiting without nausea: Secondary | ICD-10-CM

## 2019-11-20 LAB — LIPID PANEL
Cholesterol: 142 mg/dL (ref 0–200)
HDL: 34.5 mg/dL — ABNORMAL LOW (ref >39.00)
LDL Cholesterol: 84 mg/dL (ref 0–99)
NonHDL: 107.28
Total CHOL/HDL Ratio: 4
Triglycerides: 115 mg/dL (ref 0.0–149.0)
VLDL: 23 mg/dL (ref 0.0–40.0)

## 2019-11-20 LAB — COMPREHENSIVE METABOLIC PANEL
ALT: 22 U/L (ref 0–53)
AST: 23 U/L (ref 0–37)
Albumin: 4.4 g/dL (ref 3.5–5.2)
Alkaline Phosphatase: 69 U/L (ref 39–117)
BUN: 9 mg/dL (ref 6–23)
CO2: 25 mEq/L (ref 19–32)
Calcium: 10 mg/dL (ref 8.4–10.5)
Chloride: 104 mEq/L (ref 96–112)
Creatinine, Ser: 0.88 mg/dL (ref 0.40–1.50)
GFR: 91.01 mL/min (ref >60.00)
Glucose, Bld: 63 mg/dL — ABNORMAL LOW (ref 70–99)
Potassium: 4.2 mEq/L (ref 3.5–5.1)
Sodium: 136 mEq/L (ref 135–145)
Total Bilirubin: 0.4 mg/dL (ref 0.2–1.2)
Total Protein: 7 g/dL (ref 6.0–8.3)

## 2019-11-20 LAB — PSA: PSA: 1.47 ng/mL (ref 0.10–4.00)

## 2019-11-20 MED ORDER — PANTOPRAZOLE SODIUM 40 MG PO TBEC: 40.0000 mg | DELAYED_RELEASE_TABLET | Freq: Every day | ORAL | 3 refills | Status: DC

## 2019-11-20 MED ORDER — LISINOPRIL 20 MG PO TABS: 20.0000 mg | ORAL_TABLET | Freq: Every day | ORAL | 3 refills | Status: DC

## 2019-11-20 NOTE — Patient Instructions (Addendum)
For toenail fungus- terbinafine- don't take if expired  For nasal drainage/ vomiting- move fluticasone nasal spray to bedtime. Take daily, over the counter antihistamine like generic Zyrtec, Claritin, Allegra. Increase water. Decrease night time benadryl use.   If not better in a couple of weeks, let me know  If you are doing well, follow up in 1 year  Get an eye exam   Health Maintenance, Male Adopting a healthy lifestyle and getting preventive care are important in promoting health and wellness. Ask your health care provider about:  The right schedule for you to have regular tests and exams.  Things you can do on your own to prevent diseases and keep yourself healthy. What should I know about diet, weight, and exercise? Eat a healthy diet   Eat a diet that includes plenty of vegetables, fruits, low-fat dairy products, and lean protein.  Do not eat a lot of foods that are high in solid fats, added sugars, or sodium. Maintain a healthy weight Body mass index (BMI) is a measurement that can be used to identify possible weight problems. It estimates body fat based on height and weight. Your health care provider can help determine your BMI and help you achieve or maintain a healthy weight. Get regular exercise Get regular exercise. This is one of the most important things you can do for your health. Most adults should:  Exercise for at least 150 minutes each week. The exercise should increase your heart rate and make you sweat (moderate-intensity exercise).  Do strengthening exercises at least twice a week. This is in addition to the moderate-intensity exercise.  Spend less time sitting. Even light physical activity can be beneficial. Watch cholesterol and blood lipids Have your blood tested for lipids and cholesterol at 52 years of age, then have this test every 5 years. You may need to have your cholesterol levels checked more often if:  Your lipid or cholesterol levels are  high.  You are older than 52 years of age.  You are at high risk for heart disease. What should I know about cancer screening? Many types of cancers can be detected early and may often be prevented. Depending on your health history and family history, you may need to have cancer screening at various ages. This may include screening for:  Colorectal cancer.  Prostate cancer.  Skin cancer.  Lung cancer. What should I know about heart disease, diabetes, and high blood pressure? Blood pressure and heart disease  High blood pressure causes heart disease and increases the risk of stroke. This is more likely to develop in people who have high blood pressure readings, are of African descent, or are overweight.  Talk with your health care provider about your target blood pressure readings.  Have your blood pressure checked: ? Every 3-5 years if you are 9-51 years of age. ? Every year if you are 100 years old or older.  If you are between the ages of 62 and 63 and are a current or former smoker, ask your health care provider if you should have a one-time screening for abdominal aortic aneurysm (AAA). Diabetes Have regular diabetes screenings. This checks your fasting blood sugar level. Have the screening done:  Once every three years after age 34 if you are at a normal weight and have a low risk for diabetes.  More often and at a younger age if you are overweight or have a high risk for diabetes. What should I know about preventing infection? Hepatitis B  If you have a higher risk for hepatitis B, you should be screened for this virus. Talk with your health care provider to find out if you are at risk for hepatitis B infection. Hepatitis C Blood testing is recommended for:  Everyone born from [birth year] through 1963.  Anyone with known risk factors for hepatitis C. Sexually transmitted infections (STIs)  You should be screened each year for STIs, including gonorrhea and chlamydia,  if: ? You are sexually active and are younger than 52 years of age. ? You are older than 52 years of age and your health care provider tells you that you are at risk for this type of infection. ? Your sexual activity has changed since you were last screened, and you are at increased risk for chlamydia or gonorrhea. Ask your health care provider if you are at risk.  Ask your health care provider about whether you are at high risk for HIV. Your health care provider may recommend a prescription medicine to help prevent HIV infection. If you choose to take medicine to prevent HIV, you should first get tested for HIV. You should then be tested every 3 months for as long as you are taking the medicine. Follow these instructions at home: Lifestyle  Do not use any products that contain nicotine or tobacco, such as cigarettes, e-cigarettes, and chewing tobacco. If you need help quitting, ask your health care provider.  Do not use street drugs.  Do not share needles.  Ask your health care provider for help if you need support or information about quitting drugs. Alcohol use  Do not drink alcohol if your health care provider tells you not to drink.  If you drink alcohol: ? Limit how much you have to 0-2 drinks a day. ? Be aware of how much alcohol is in your drink. In the [place] one drink equals one 12 oz bottle of beer (355 mL), one 5 oz glass of wine (148 mL), or one 1 oz glass of hard liquor (44 mL). General instructions  Schedule regular health, dental, and eye exams.  Stay current with your vaccines.  Tell your health care provider if: ? You often feel depressed. ? You have ever been abused or do not feel safe at home. Summary  Adopting a healthy lifestyle and getting preventive care are important in promoting health and wellness.  Follow your health care provider's instructions about healthy diet, exercising, and getting tested or screened for diseases.  Follow your health care  provider's instructions on monitoring your cholesterol and blood pressure. This information is not intended to replace advice given to you by your health care provider. Make sure you discuss any questions you have with your health care provider. Document Revised: 09/03/2018 Document Reviewed: 09/03/2018 Elsevier Patient Education  2020 [employer].

## 2019-11-20 NOTE — Progress Notes (Signed)
Subjective:    Patient ID: [name], male    DOB: 01/21/1966, 54 y.o.   MRN: [id number]  HPI This is a 54 yo male who presents today for CPE.     Last CPE- 10/24/2018 PSA-we will check today Colonoscopy-07/15/2018 Tdap-10/24/2018 Flu-annual Dental-regular Eye-overdue Exercise-unable to due to back pain  Has continued am vomiting 4x/ week, worse with post nasal drainage, gets up in morning and coughs which causes him to gag and vomit.  He tried Prevacid twice daily for 2 weeks and then back to daily.  He did not find this helpful and thinks that pantoprazole works better for him.  Mood disorder/chronic pain-continues to be seen by neurosurgery and psychiatry.  Continues to await disability verdict.  Mood is stable.  Obesity-he continues to carefully watch diet and has lost 13 more pounds since I saw him in August.  Sleep-disrupted by pain.  Activity-he is able to do very little in terms of exercise or activities around the home.  Onychomycosis-this is been an ongoing problem and he has been prescribed terbinafine in the past.  He did not take for concerns over side effects but wishes to take now.  Tobacco abuse-continues to smoke about 1/2 pack/day.  Knows he should quit but struggles with motivation.   Review of Systems  Constitutional: Negative.   HENT: Positive for congestion and postnasal drip.   Eyes: Negative.   Respiratory: Negative.   Cardiovascular: Negative.   Gastrointestinal: Positive for vomiting. Negative for abdominal pain, blood in stool, constipation, diarrhea and nausea.  Endocrine: Negative.   Genitourinary: Negative.   Musculoskeletal: Positive for back pain.  Skin: Negative.   Allergic/Immunologic: Negative.   Neurological: Negative.   Psychiatric/Behavioral: Positive for dysphoric mood and sleep disturbance.       Objective:   Physical Exam Vitals reviewed.  Constitutional:      General: He is not in acute distress.    Appearance:  Normal appearance. He is obese. He is not ill-appearing, toxic-appearing or diaphoretic.  HENT:     Head: Normocephalic and atraumatic.     Right Ear: External ear normal.     Left Ear: External ear normal.  Eyes:     Conjunctiva/sclera: Conjunctivae normal.  Cardiovascular:     Rate and Rhythm: Normal rate and regular rhythm.     Heart sounds: Normal heart sounds.  Pulmonary:     Effort: Pulmonary effort is normal.     Breath sounds: Normal breath sounds.  Abdominal:     General: Abdomen is flat. Bowel sounds are normal. There is no distension.     Palpations: Abdomen is soft. There is no mass.     Tenderness: There is no abdominal tenderness. There is no guarding or rebound.     Hernia: No hernia is present.  Musculoskeletal:     Right lower leg: No edema.     Left lower leg: No edema.  Skin:    General: Skin is warm and dry.     Comments: Bilateral great toenails thickened and left with some separation from nail bed distally.   Neurological:     Mental Status: He is alert and oriented to person, place, and time.  Psychiatric:        Mood and Affect: Mood normal.        Behavior: Behavior normal.        Thought Content: Thought content normal.        Judgment: Judgment normal.     BP 110/88  Pulse 98   Temp 98.4 F (36.9 C)   Resp 12   Ht 5' 8.75" (1.746 m)   Wt 212 lb 4 oz (96.3 kg)   SpO2 97%   BMI 31.57 kg/m  Wt Readings from Last 3 Encounters:  11/20/19 212 lb 4 oz (96.3 kg)  04/29/19 225 lb (102.1 kg)  10/24/18 224 lb (101.6 kg)       Assessment & Plan:  1. Annual physical exam  - Discussed and encouraged healthy lifestyle choices- adequate sleep, regular exercise, stress management and healthy food choices.    2. Gastroesophageal reflux disease, unspecified whether esophagitis present - pantoprazole (PROTONIX) 40 MG tablet; Take 1 tablet (40 mg total) by mouth daily.  Dispense: 90 tablet; Refill: 3  3. Essential hypertension -Well-controlled,  consider decreasing lisinopril as needed weight loss. - lisinopril (ZESTRIL) 20 MG tablet; Take 1 tablet (20 mg total) by mouth daily.  Dispense: 90 tablet; Refill: 3 - Lipid Panel - Comprehensive metabolic panel  4. Screening for prostate cancer - PSA  5. TOBACCO ABUSE -In contemplative phase.  Encouraged him to at least decrease amount.  6. Onychomycosis -He thinks he still has prescription at home that was prescribed from him prior.  We will check his LFTs today and he will double check that his medication has not expired prior to starting.  7. Non-intractable vomiting without nausea, unspecified vomiting type -I strongly suspect this is related to postnasal drainage.  Had long discussion about continuing Flonase, PPI, adding long-acting antihistamine, increasing fluids and decreasing smoking. -If this is not better in 2 weeks, I have asked him to let me know.  We will have him see GI.  This visit occurred during the SARS-CoV-2 public health emergency.  Safety protocols were in place, including screening questions prior to the visit, additional usage of staff PPE, and extensive cleaning of exam room while observing appropriate contact time as indicated for disinfecting solutions.    Clarene Reamer, FNP-BC  [facility]  11/21/2019 12:30 PM

## 2020-01-11 ENCOUNTER — Other Ambulatory Visit: Payer: Self-pay | Admitting: Family Medicine

## 2020-01-11 DIAGNOSIS — E785 Hyperlipidemia, unspecified: Secondary | ICD-10-CM

## 2020-06-02 ENCOUNTER — Other Ambulatory Visit: Payer: Self-pay

## 2020-06-02 MED ORDER — SILDENAFIL CITRATE 20 MG PO TABS: 40.0000 mg | ORAL_TABLET | Freq: Every day | ORAL | 1 refills | Status: AC | PRN

## 2020-06-02 NOTE — Telephone Encounter (Signed)
Pt left a message on triage line for a refill request for for sildenafil to be sent to [facility]. Michela Pitcher he is leaving to go out of town today and would like it today. It has not been done since 2019. Will have to be approved by Jackelyn Poling.

## 2020-09-06 ENCOUNTER — Telehealth: Payer: Self-pay | Admitting: Family Medicine

## 2020-09-06 NOTE — Telephone Encounter (Signed)
Dr Toy Care. The patient's psychiatrist called stating that you are prescribing the patient xanax as well as her. She wanted you to be aware of it. Please advise.

## 2020-09-07 ENCOUNTER — Emergency Department (HOSPITAL_COMMUNITY)
Admission: EM | Admit: 2020-09-07 | Discharge: 2020-09-08 | Disposition: A | Discharge status: Home / Self Care | Payer: 59 | Attending: Emergency Medicine | Admitting: Emergency Medicine

## 2020-09-07 ENCOUNTER — Other Ambulatory Visit: Payer: Self-pay

## 2020-09-07 DIAGNOSIS — F419 Anxiety disorder, unspecified: Secondary | ICD-10-CM | POA: Diagnosis not present

## 2020-09-07 DIAGNOSIS — F17210 Nicotine dependence, cigarettes, uncomplicated: Secondary | ICD-10-CM | POA: Diagnosis not present

## 2020-09-07 DIAGNOSIS — F312 Bipolar disorder, current episode manic severe with psychotic features: Secondary | ICD-10-CM | POA: Diagnosis not present

## 2020-09-07 DIAGNOSIS — F1290 Cannabis use, unspecified, uncomplicated: Secondary | ICD-10-CM | POA: Diagnosis not present

## 2020-09-07 DIAGNOSIS — Z79899 Other long term (current) drug therapy: Secondary | ICD-10-CM | POA: Insufficient documentation

## 2020-09-07 DIAGNOSIS — F1910 Other psychoactive substance abuse, uncomplicated: Secondary | ICD-10-CM

## 2020-09-07 DIAGNOSIS — R451 Restlessness and agitation: Secondary | ICD-10-CM | POA: Insufficient documentation

## 2020-09-07 DIAGNOSIS — F15959 Other stimulant use, unspecified with stimulant-induced psychotic disorder, unspecified: Secondary | ICD-10-CM | POA: Diagnosis not present

## 2020-09-07 DIAGNOSIS — R000 Tachycardia, unspecified: Secondary | ICD-10-CM | POA: Insufficient documentation

## 2020-09-07 DIAGNOSIS — F1490 Cocaine use, unspecified, uncomplicated: Secondary | ICD-10-CM | POA: Diagnosis not present

## 2020-09-07 DIAGNOSIS — I10 Essential (primary) hypertension: Secondary | ICD-10-CM | POA: Insufficient documentation

## 2020-09-07 DIAGNOSIS — Z20822 Contact with and (suspected) exposure to covid-19: Secondary | ICD-10-CM | POA: Insufficient documentation

## 2020-09-07 DIAGNOSIS — R45851 Suicidal ideations: Secondary | ICD-10-CM

## 2020-09-07 DIAGNOSIS — R4689 Other symptoms and signs involving appearance and behavior: Secondary | ICD-10-CM

## 2020-09-07 LAB — RAPID URINE DRUG SCREEN, HOSP PERFORMED
Amphetamines: POSITIVE — AB
Barbiturates: NOT DETECTED
Benzodiazepines: POSITIVE — AB
Cocaine: POSITIVE — AB
Opiates: NOT DETECTED
Tetrahydrocannabinol: POSITIVE — AB

## 2020-09-07 LAB — COMPREHENSIVE METABOLIC PANEL
ALT: 36 U/L (ref 0–44)
AST: 42 U/L — ABNORMAL HIGH (ref 15–41)
Albumin: 4.3 g/dL (ref 3.5–5.0)
Alkaline Phosphatase: 65 U/L (ref 38–126)
Anion gap: 13 (ref 5–15)
BUN: 10 mg/dL (ref 6–20)
CO2: 17 mmol/L — ABNORMAL LOW (ref 22–32)
Calcium: 8.9 mg/dL (ref 8.9–10.3)
Chloride: 101 mmol/L (ref 98–111)
Creatinine, Ser: 0.91 mg/dL (ref 0.61–1.24)
GFR, Estimated: >60 mL/min (ref >60)
Glucose, Bld: 95 mg/dL (ref 70–99)
Potassium: 3.9 mmol/L (ref 3.5–5.1)
Sodium: 131 mmol/L — ABNORMAL LOW (ref 135–145)
Total Bilirubin: 0.5 mg/dL (ref 0.3–1.2)
Total Protein: 7.1 g/dL (ref 6.5–8.1)

## 2020-09-07 LAB — CBC
HCT: 42.9 % (ref 39.0–52.0)
Hemoglobin: 14.7 g/dL (ref 13.0–17.0)
MCH: 31.3 pg (ref 26.0–34.0)
MCHC: 34.3 g/dL (ref 30.0–36.0)
MCV: 91.5 fL (ref 80.0–100.0)
Platelets: 359 10*3/uL (ref 150–400)
RBC: 4.69 MIL/uL (ref 4.22–5.81)
RDW: 13 % (ref 11.5–15.5)
WBC: 19.4 10*3/uL — ABNORMAL HIGH (ref 4.0–10.5)
nRBC: 0 % (ref 0.0–0.2)

## 2020-09-07 LAB — SALICYLATE LEVEL: Salicylate Lvl: <7 mg/dL — ABNORMAL LOW (ref 7.0–30.0)

## 2020-09-07 LAB — ACETAMINOPHEN LEVEL: Acetaminophen (Tylenol), Serum: <10 ug/mL — ABNORMAL LOW (ref 10–30)

## 2020-09-07 LAB — ETHANOL: Alcohol, Ethyl (B): <10 mg/dL (ref <10)

## 2020-09-07 LAB — RESP PANEL BY RT-PCR (FLU A&B, COVID) ARPGX2
Influenza A by PCR: NEGATIVE
Influenza B by PCR: NEGATIVE
SARS Coronavirus 2 by RT PCR: NEGATIVE

## 2020-09-07 MED ORDER — ZOLPIDEM TARTRATE 5 MG PO TABS
5.0000 mg | ORAL_TABLET | Freq: Every evening | ORAL | Status: DC | PRN
  Administered 2020-09-08: 5 mg via ORAL
  Filled 2020-09-07: qty 1

## 2020-09-07 MED ORDER — ZIPRASIDONE MESYLATE 20 MG IM SOLR
20.0000 mg | Freq: Once | INTRAMUSCULAR | Status: AC
  Administered 2020-09-07: 20 mg via INTRAMUSCULAR
  Filled 2020-09-07: qty 20

## 2020-09-07 MED ORDER — LORAZEPAM 2 MG/ML IJ SOLN
2.0000 mg | Freq: Once | INTRAMUSCULAR | Status: AC
  Administered 2020-09-07: 2 mg via INTRAMUSCULAR
  Filled 2020-09-07: qty 1

## 2020-09-07 MED ORDER — STERILE WATER FOR INJECTION IJ SOLN
INTRAMUSCULAR | Status: AC
  Filled 2020-09-07: qty 10

## 2020-09-07 NOTE — BH Assessment (Addendum)
TO BE SEEN  @ 2208 TTS attempted to completed patient's assessment. Per nursing Larene Beach, RN), paient was given medication (Geodon) and unable to be aroused. Nursing agreed to contact TTS when patient awakens and able to complete his assessment.

## 2020-09-07 NOTE — Telephone Encounter (Signed)
Called Dr. Starleen Arms office to clarify. I have not prescribed alprazolam for patient. I reviewed [place] Controlled Substance Database to verify.

## 2020-09-07 NOTE — ED Provider Notes (Signed)
[facility] DEPT Provider Note   CSN: [id number] Arrival date & time: 09/07/20  1456     History Chief Complaint  Patient presents with  . 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 male.  Presents to the ER by police on a IVC hold.  He is at the ER combative threatening staff not cooperative.  Patient will patient history for review of systems unable to be obtained due to lack of patient cooperation.         Past Medical History:  Diagnosis Date  . Anxiety   . Arthritis    degeneration of spine & scoliosis  . Bipolar disorder ([place])   . GERD (gastroesophageal reflux disease)   . H/O echocardiogram ?2015  . Hyperlipidemia   . Hypertension   . Pneumonia 2002   [place] admission   . Shortness of breath dyspnea   . Sleep apnea    test 3/15-waiting results-was told oxygen did drop- did additional study done at home, told that he doesn't have sleep apnea     Patient Active Problem List   Diagnosis Date Noted  . 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 Current nicotine use 10/26/2018  . Benign neoplasm of ascending colon   . DDD (degenerative disc disease), cervical 12/17/2017  . Lumbar radiculopathy 12/17/2017  . Neck pain 08/17/2015  . Special screening for malignant neoplasms, colon 08/04/2015  . Low testosterone 05/02/2015  . Back pain 04/28/2015  . Hemorrhoid 04/28/2015  . Ganglion cyst of wrist 11/20/2013  . Testosterone deficiency 11/19/2011  . HLD (hyperlipidemia) 10/25/2010  . Bipolar disorder ([place]) 10/25/2010  . TOBACCO ABUSE 10/25/2010  . Essential hypertension 10/25/2010  . GERD 10/25/2010    Past Surgical History:  Procedure Laterality Date  . ANTERIOR CERVICAL DECOMP/DISCECTOMY FUSION N/A 08/17/2015   Procedure: ANTERIOR CERVICAL DECOMPRESSION/DISCECTOMY FUSION Cervical five-cervical seven. Two LEVELS;  Surgeon: Melina Schools, MD;  Location: [facility];  Service: Orthopedics;  Laterality: N/A;  . BIOPSY  07/15/2018    Procedure: BIOPSY;  Surgeon: Gatha Mayer, MD;  Location: [facility];  Service: Endoscopy;;  . BRONCHOSCOPY    . COLONOSCOPY WITH PROPOFOL N/A 07/15/2018   Procedure: COLONOSCOPY WITH PROPOFOL;  Surgeon: Gatha Mayer, MD;  Location: [facility];  Service: Endoscopy;  Laterality: N/A;  . MASS EXCISION Left 12/17/2013   Procedure: EXCISION MASS LEFT WRIST;  Surgeon: Tennis Must, MD;  Location: [facility];  Service: Orthopedics;  Laterality: Left;  . SEPTOPLASTY  1990       Family History  Problem Relation Age of Onset  . Cancer Mother        melanoma  . Depression Other   . Cancer Other        skin  . Colon polyps Neg Hx   . Esophageal cancer Neg Hx   . Rectal cancer Neg Hx   . Stomach cancer Neg Hx     Social History   Tobacco Use  . Smoking status: Current Every Day Smoker    Packs/day: 0.50    Types: Cigarettes  . Smokeless tobacco: Former Network engineer  . Vaping Use: Former  Substance Use Topics  . Alcohol use: No    Alcohol/week: 0.0 standard drinks    Comment: quit 2007  . Drug use: No    Comment: history cocaine-last 2006    Home Medications Prior to Admission medications   Medication Sig Start Date End Date Taking? Authorizing Provider  albuterol (PROAIR HFA) 108 (90 Base) MCG/ACT inhaler Inhale 1-2 puffs into the lungs every 6 (six) hours as needed for wheezing or shortness of breath. 04/09/18   Elby Beck, FNP  ALPRAZolam Duanne Moron) 1 MG tablet Take 1 mg by mouth 3 (three) times daily as needed for anxiety.     [provider]  B-D 3CC LUER-LOK SYR 22GX1-1/2 22G X 1-1/2" 3 ML MISC  08/14/19   [provider]  clonazePAM (KLONOPIN) 2 MG tablet Take 2 mg by mouth at bedtime.     [provider]  diphenhydrAMINE (BENADRYL) 25 MG tablet Take 50 mg by mouth at bedtime.     [provider]  fluticasone (FLONASE) 50 MCG/ACT nasal spray Place 2 sprays into both nostrils daily. 04/29/19   Elby Beck, FNP  lamoTRIgine (LAMICTAL) 150 MG tablet Take 150 mg by mouth at bedtime.    [provider]  lansoprazole (PREVACID) 30 MG capsule TAKE 1 CAPSULE BY MOUTH 30 MINUTES BEFORE BREAKFAST AND DINNER DAILY FOR 4 WEEKS, THEN TAKE ONCE A DAY BEFORE DINNER. 08/19/19   Elby Beck, FNP  LATUDA 120 MG TABS Take 120 mg by mouth daily after supper.  08/30/17   [provider]  lisinopril (ZESTRIL) 20 MG tablet Take 1 tablet (20 mg total) by mouth daily. 11/20/19   Elby Beck, FNP  methocarbamol (ROBAXIN) 500 MG tablet Take 1 tablet (500 mg total) by mouth 3 (three) times daily as needed for muscle spasms. Patient taking differently: Take 500 mg by mouth at bedtime.  08/17/15   Melina Schools, MD  naloxone [facility]) 4 MG/0.1ML LIQD nasal spray kit Narcan 4 mg/actuation nasal spray  Take by nasal route every 3 minutes until patient awakes or EMS arrives.    [provider]  oxyCODONE-acetaminophen (PERCOCET) 10-325 MG tablet Take 1 tablet by mouth every 4 (four) hours as needed for pain. Patient taking differently: Take 1 tablet by mouth every 8 (eight) hours as needed for pain.  08/17/15   Melina Schools, MD  pantoprazole (PROTONIX) 40 MG tablet Take 1 tablet (40 mg total) by mouth daily. 11/20/19   Elby Beck, FNP  QUEtiapine (SEROQUEL) 300 MG tablet Take 450 mg by mouth at bedtime.     [provider]  sertraline (ZOLOFT) 100 MG tablet Take 100 mg by mouth daily.     [provider]  sildenafil (REVATIO) 20 MG tablet Take 2-5 tablets (40-100 mg total) by mouth daily as needed (for erectile dysfunction.). 06/02/20   Elby Beck, FNP  simvastatin (ZOCOR) 40 MG tablet TAKE 1 TABLET BY MOUTH DAILY AT 6 PM. 01/13/20   Elby Beck, FNP  tamsulosin (FLOMAX) 0.4 MG CAPS capsule Take 0.8 mg by mouth at bedtime.     [provider]  terbinafine (LAMISIL) 250 MG tablet Take 1 tablet (250 mg total) by mouth daily. 04/09/18    Elby Beck, FNP  Testosterone 30 MG/ACT SOLN  09/08/18   [provider]  testosterone cypionate (DEPOTESTOTERONE CYPIONATE) 100 MG/ML injection SMARTSIG:1 Milliliter(s) IM Once a Week 11/19/19   [provider]  zolpidem (AMBIEN) 10 MG tablet Take 10 mg by mouth at bedtime.  09/16/17   [provider]    Allergies    Patient has no known allergies.  Review of Systems   Review of Systems  Unable to perform ROS: Psychiatric disorder    Physical Exam Updated Vital Signs BP (!) 154/117 (BP Location: Right  Arm)   Pulse (!) 127 Comment: Patient is agitated and screaming and cursing  Temp 98.2 F (36.8 C) (Oral)   Resp 18   SpO2 100% Comment: Patient is agitated and screaming and cursing  Physical Exam Constitutional:      Appearance: He is well-developed.     Comments: Agitated, combative  HENT:     Head: Normocephalic.     Nose: Nose normal.  Eyes:     Extraocular Movements: Extraocular movements intact.  Cardiovascular:     Rate and Rhythm: Tachycardia present.  Pulmonary:     Effort: Pulmonary effort is normal.  Musculoskeletal:     Right lower leg: No edema.     Left lower leg: No edema.  Skin:    General: Skin is warm.     Capillary Refill: Capillary refill takes less than 2 seconds.  Neurological:     General: No focal deficit present.     Mental Status: He is alert.     ED Results / Procedures / Treatments   Labs (all labs ordered are listed, but only abnormal results are displayed) Labs Reviewed  CBC - Abnormal; Notable for the following components:      Result Value   WBC 19.4 (*)    All other components within normal limits  RESP PANEL BY RT-PCR (FLU A&B, COVID) ARPGX2  COMPREHENSIVE METABOLIC PANEL  ETHANOL  SALICYLATE LEVEL  ACETAMINOPHEN LEVEL  RAPID URINE DRUG SCREEN, HOSP PERFORMED    EKG None  Radiology No results found.  Procedures Procedures (including critical care time)  Medications Ordered in  ED Medications  sterile water (preservative free) injection (has no administration in time range)  ziprasidone (GEODON) injection 20 mg (20 mg Intramuscular Given 09/07/20 1529)    ED Course  I have reviewed the triage vital signs and the nursing notes.  Pertinent labs & imaging results that were available during my care of the patient were reviewed by me and considered in my medical decision making (see chart for details).    MDM Rules/Calculators/A&P                          Patient arrived to the ER agitated, combative, not answering questions and threatening staff.  Requiring chemical sedation.  After sedation, patient feels much more calm.  He states he has no pain or discomfort.  No fever no vomiting cough or diarrhea.  He states that he is just "slow in the head."  Pending psychiatry evaluation.     Final Clinical Impression(s) / ED Diagnoses Final diagnoses:  None    Rx / DC Orders ED Discharge Orders    None       Luna Fuse, MD 09/07/20 [phone]

## 2020-09-07 NOTE — ED Notes (Signed)
Patient screaming and calling staff "bitches" and saying "Fuck you" to staff.

## 2020-09-07 NOTE — ED Notes (Signed)
Patient's wife called and was asking how patient was doing. She was also referring to a phone call the patient made earlier. RN assured patient that he was lying in bed resting and was calm at the moment.
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
## 2020-09-07 NOTE — ED Triage Notes (Signed)
Patient presents to ED for IVC, Manic/violent behavior and is cursing at staff and repeating himself and threatening people
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
## 2020-09-07 NOTE — ED Notes (Signed)
Patient proceeded to call his wife and stated "I am going to commit you for alcoholism! You cannot even take care of our child, you will get drunk and forget her, you have been buying a box of wine every night." Patient's wife hung up on him and he proceeded to try and call her back, RN took the phone.

## 2020-09-08 MED ORDER — DIPHENHYDRAMINE HCL 50 MG/ML IJ SOLN
50.0000 mg | Freq: Once | INTRAMUSCULAR | Status: AC
  Administered 2020-09-08: 50 mg via INTRAMUSCULAR
  Filled 2020-09-08: qty 1

## 2020-09-08 MED ORDER — SIMVASTATIN 20 MG PO TABS: 40.0000 mg | ORAL_TABLET | Freq: Every day | ORAL | Status: DC

## 2020-09-08 MED ORDER — LORAZEPAM 2 MG/ML IJ SOLN
2.0000 mg | Freq: Once | INTRAMUSCULAR | Status: AC
  Administered 2020-09-08: 2 mg via INTRAMUSCULAR
  Filled 2020-09-08: qty 1

## 2020-09-08 MED ORDER — ZIPRASIDONE MESYLATE 20 MG IM SOLR
10.0000 mg | Freq: Once | INTRAMUSCULAR | Status: AC
  Administered 2020-09-08: 10 mg via INTRAMUSCULAR
  Filled 2020-09-08: qty 20

## 2020-09-08 MED ORDER — TAMSULOSIN HCL 0.4 MG PO CAPS
0.8000 mg | ORAL_CAPSULE | Freq: Every day | ORAL | Status: DC
  Filled 2020-09-08: qty 2

## 2020-09-08 MED ORDER — OXYCODONE-ACETAMINOPHEN 10-325 MG PO TABS: 1.0000 | ORAL_TABLET | Freq: Three times a day (TID) | ORAL | Status: DC | PRN

## 2020-09-08 MED ORDER — NICOTINE 14 MG/24HR TD PT24
14.0000 mg | MEDICATED_PATCH | Freq: Once | TRANSDERMAL | Status: DC
  Administered 2020-09-08: 14 mg via TRANSDERMAL
  Filled 2020-09-08: qty 1

## 2020-09-08 MED ORDER — QUETIAPINE FUMARATE 50 MG PO TABS: 450.0000 mg | ORAL_TABLET | Freq: Every day | ORAL | Status: DC

## 2020-09-08 MED ORDER — ZOLPIDEM TARTRATE 5 MG PO TABS: 10.0000 mg | ORAL_TABLET | Freq: Every day | ORAL | Status: DC

## 2020-09-08 MED ORDER — FLUTICASONE PROPIONATE 50 MCG/ACT NA SUSP
2.0000 | Freq: Every day | NASAL | Status: DC
  Filled 2020-09-08: qty 16

## 2020-09-08 MED ORDER — ALPRAZOLAM 1 MG PO TABS
1.0000 mg | ORAL_TABLET | Freq: Three times a day (TID) | ORAL | Status: DC | PRN
  Administered 2020-09-08: 1 mg via ORAL
  Filled 2020-09-08: qty 1

## 2020-09-08 MED ORDER — CLONAZEPAM 1 MG PO TABS
2.0000 mg | ORAL_TABLET | Freq: Every day | ORAL | Status: DC
Start: 2020-09-08 — End: 2020-09-08

## 2020-09-08 MED ORDER — LAMOTRIGINE 25 MG PO TABS: 150.0000 mg | ORAL_TABLET | Freq: Every day | ORAL | Status: DC

## 2020-09-08 MED ORDER — LISINOPRIL 20 MG PO TABS
20.0000 mg | ORAL_TABLET | Freq: Every day | ORAL | Status: DC
  Administered 2020-09-08: 20 mg via ORAL
  Filled 2020-09-08: qty 1

## 2020-09-08 MED ORDER — HALOPERIDOL LACTATE 5 MG/ML IJ SOLN
10.0000 mg | Freq: Once | INTRAMUSCULAR | Status: AC
  Administered 2020-09-08: 10 mg via INTRAMUSCULAR
  Filled 2020-09-08: qty 2

## 2020-09-08 MED ORDER — LURASIDONE HCL 40 MG PO TABS
120.0000 mg | ORAL_TABLET | Freq: Every day | ORAL | Status: DC
  Filled 2020-09-08: qty 3

## 2020-09-08 MED ORDER — ALBUTEROL SULFATE HFA 108 (90 BASE) MCG/ACT IN AERS: 1.0000 | INHALATION_SPRAY | Freq: Four times a day (QID) | RESPIRATORY_TRACT | Status: DC | PRN

## 2020-09-08 MED ORDER — STERILE WATER FOR INJECTION IJ SOLN
INTRAMUSCULAR | Status: AC
  Filled 2020-09-08: qty 10

## 2020-09-08 MED ORDER — OXYCODONE HCL 5 MG PO TABS: 5.0000 mg | ORAL_TABLET | Freq: Three times a day (TID) | ORAL | Status: DC | PRN

## 2020-09-08 MED ORDER — SERTRALINE HCL 50 MG PO TABS
100.0000 mg | ORAL_TABLET | Freq: Every day | ORAL | Status: DC
  Administered 2020-09-08: 100 mg via ORAL
  Filled 2020-09-08: qty 2

## 2020-09-08 MED ORDER — PANTOPRAZOLE SODIUM 40 MG PO TBEC
40.0000 mg | DELAYED_RELEASE_TABLET | Freq: Every day | ORAL | Status: DC
  Administered 2020-09-08: 40 mg via ORAL
  Filled 2020-09-08: qty 1

## 2020-09-08 MED ORDER — OXYCODONE-ACETAMINOPHEN 5-325 MG PO TABS: 1.0000 | ORAL_TABLET | Freq: Three times a day (TID) | ORAL | Status: DC | PRN

## 2020-09-08 NOTE — BH Assessment (Signed)
[facility] Assessment Progress Note  Per Shuvon Rankin, NP, this pt does not require psychiatric hospitalization at this time.  Pt presents under IVC initiated by pt's spouse.  However, a separate IVC was initiated by Miller, MG, which has since been rescinded by EDP Lennice Sites, DO.  Pt is psychiatrically cleared.  Discharge instructions include referral information for area psychiatrists.  Dr Ronnald Nian and pt's nurse, Eustaquio Maize, have been notified.  Jalene Mullet, [facility] Triage Specialist [phone]

## 2020-09-08 NOTE — ED Notes (Signed)
Patient begins cursing and yelling loudly, disrupting the milieu. Patient got out of bed and was becoming more aggressive and threatening staff. Security called. Dr.Ward notified of pt's aggressive behavior.

## 2020-09-08 NOTE — ED Notes (Signed)
Patient is very agitated and combative. Patient is saying racial slurs to staff. Patient has called staff the "n" word and "monkey". Patient is upset because he can't have any more medication. At about 0200 patient attempted to run out of the emergency department and was restrained by security and brought back to room. Patient was put in restraints and being monitored.
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
## 2020-09-08 NOTE — ED Notes (Signed)
Pt Alert. Talking.

## 2020-09-08 NOTE — ED Notes (Signed)
Patient given Ambien 5mg  for sleep. Pt stated he takes Ambien 10mg  and Xanax. RN informed patient that this was what the MD ordered. Pt stated that if he took Ambien 10mg  and 5mg  would be not work.Pt stated he would become a problem if he doesn't get his medication. Pt yelling out that " I'm not fucking crazy and this is cruel and unusual punishment."
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
## 2020-09-08 NOTE — ED Notes (Addendum)
Patient continues to yell and scream " Fuck You Bitch" to staff. He continues to make racial comments as well.

## 2020-09-08 NOTE — ED Notes (Signed)
Pt DC d off unit to home per provider. Pt alert, cooperative, no s/s of distress. DC information and resources given to pt.  Belongings given to pt. Pt ambulatory off unit, escorted by NT. Pt DC by self.

## 2020-09-08 NOTE — ED Notes (Signed)
Pt out of restraints. Cooperative at this time.

## 2020-09-08 NOTE — ED Notes (Addendum)
Patient tried to leave and ran outside of TCU. Security followed and was able to secure patient. Dr.Ward notified. Patient placed in 4 point restraints

## 2020-09-08 NOTE — Discharge Instructions (Signed)
For your behavioral health needs, you are advised to follow up with one the following providers.  Contact them at your earliest opportunity to ask about scheduling an intake appointment:       [facility]      [address], [place] [address]      [phone]       [address] [place], [place] [address]      [phone]       [facility]      [address]      [place], [place] [address]      [phone]

## 2020-09-08 NOTE — ED Provider Notes (Signed)
Patient cleared by psychiatry.  Discharged in good condition.   Lennice Sites, DO 09/08/20 1303

## 2020-09-09 ENCOUNTER — Other Ambulatory Visit: Payer: Self-pay | Admitting: Family Medicine

## 2020-09-09 DIAGNOSIS — E785 Hyperlipidemia, unspecified: Secondary | ICD-10-CM

## 2020-09-12 NOTE — Telephone Encounter (Signed)
Pharmacy requests refill on: Simvastatin 40 mg   LAST REFILL: 01/13/2020 (Q-90, R-1) LAST OV: 11/20/2019 NEXT OV: Not Scheduled  PHARMACY: [place], [place]

## 2020-11-01 ENCOUNTER — Encounter (HOSPITAL_COMMUNITY): Payer: Self-pay | Admitting: *Deleted

## 2020-11-01 ENCOUNTER — Emergency Department (HOSPITAL_COMMUNITY): Payer: BLUE CROSS/BLUE SHIELD

## 2020-11-01 ENCOUNTER — Emergency Department (HOSPITAL_COMMUNITY)
Admission: EM | Admit: 2020-11-01 | Discharge: 2020-11-01 | Payer: BLUE CROSS/BLUE SHIELD | Source: Home / Self Care | Attending: Emergency Medicine | Admitting: Emergency Medicine

## 2020-11-01 ENCOUNTER — Other Ambulatory Visit: Payer: Self-pay

## 2020-11-01 DIAGNOSIS — F13131 Sedative, hypnotic or anxiolytic abuse with withdrawal delirium: Secondary | ICD-10-CM | POA: Insufficient documentation

## 2020-11-01 DIAGNOSIS — F17210 Nicotine dependence, cigarettes, uncomplicated: Secondary | ICD-10-CM | POA: Insufficient documentation

## 2020-11-01 DIAGNOSIS — Z79899 Other long term (current) drug therapy: Secondary | ICD-10-CM | POA: Insufficient documentation

## 2020-11-01 DIAGNOSIS — I10 Essential (primary) hypertension: Secondary | ICD-10-CM | POA: Insufficient documentation

## 2020-11-01 DIAGNOSIS — M6282 Rhabdomyolysis: Secondary | ICD-10-CM | POA: Diagnosis not present

## 2020-11-01 DIAGNOSIS — E860 Dehydration: Secondary | ICD-10-CM | POA: Insufficient documentation

## 2020-11-01 DIAGNOSIS — S0031XA Abrasion of nose, initial encounter: Secondary | ICD-10-CM | POA: Insufficient documentation

## 2020-11-01 DIAGNOSIS — Y92149 Unspecified place in prison as the place of occurrence of the external cause: Secondary | ICD-10-CM | POA: Insufficient documentation

## 2020-11-01 DIAGNOSIS — R569 Unspecified convulsions: Secondary | ICD-10-CM

## 2020-11-01 DIAGNOSIS — Y999 Unspecified external cause status: Secondary | ICD-10-CM | POA: Insufficient documentation

## 2020-11-01 DIAGNOSIS — W1839XA Other fall on same level, initial encounter: Secondary | ICD-10-CM | POA: Insufficient documentation

## 2020-11-01 DIAGNOSIS — F319 Bipolar disorder, unspecified: Secondary | ICD-10-CM | POA: Insufficient documentation

## 2020-11-01 DIAGNOSIS — Y9389 Activity, other specified: Secondary | ICD-10-CM | POA: Insufficient documentation

## 2020-11-01 DIAGNOSIS — S0081XA Abrasion of other part of head, initial encounter: Secondary | ICD-10-CM | POA: Insufficient documentation

## 2020-11-01 DIAGNOSIS — F13231 Sedative, hypnotic or anxiolytic dependence with withdrawal delirium: Secondary | ICD-10-CM

## 2020-11-01 DIAGNOSIS — F13931 Sedative, hypnotic or anxiolytic use, unspecified with withdrawal delirium: Secondary | ICD-10-CM

## 2020-11-01 LAB — I-STAT CHEM 8, ED
BUN: 18 mg/dL (ref 6–20)
Calcium, Ion: 1.07 mmol/L — ABNORMAL LOW (ref 1.15–1.40)
Chloride: 104 mmol/L (ref 98–111)
Creatinine, Ser: 0.7 mg/dL (ref 0.61–1.24)
Glucose, Bld: 85 mg/dL (ref 70–99)
HCT: 45 % (ref 39.0–52.0)
Hemoglobin: 15.3 g/dL (ref 13.0–17.0)
Potassium: 3.6 mmol/L (ref 3.5–5.1)
Sodium: 137 mmol/L (ref 135–145)
TCO2: 20 mmol/L — ABNORMAL LOW (ref 22–32)

## 2020-11-01 LAB — CBC WITH DIFFERENTIAL/PLATELET
Abs Immature Granulocytes: 0.25 10*3/uL — ABNORMAL HIGH (ref 0.00–0.07)
Basophils Absolute: 0.1 10*3/uL (ref 0.0–0.1)
Basophils Relative: 0 %
Eosinophils Absolute: 0 10*3/uL (ref 0.0–0.5)
Eosinophils Relative: 0 %
HCT: 48.7 % (ref 39.0–52.0)
Hemoglobin: 16.7 g/dL (ref 13.0–17.0)
Immature Granulocytes: 1 %
Lymphocytes Relative: 4 %
Lymphs Abs: 1 10*3/uL (ref 0.7–4.0)
MCH: 32.7 pg (ref 26.0–34.0)
MCHC: 34.3 g/dL (ref 30.0–36.0)
MCV: 95.3 fL (ref 80.0–100.0)
Monocytes Absolute: 1 10*3/uL (ref 0.1–1.0)
Monocytes Relative: 4 %
Neutro Abs: 21.7 10*3/uL — ABNORMAL HIGH (ref 1.7–7.7)
Neutrophils Relative %: 91 %
Platelets: 370 10*3/uL (ref 150–400)
RBC: 5.11 MIL/uL (ref 4.22–5.81)
RDW: 13.2 % (ref 11.5–15.5)
WBC: 23.9 10*3/uL — ABNORMAL HIGH (ref 4.0–10.5)
nRBC: 0 % (ref 0.0–0.2)

## 2020-11-01 LAB — OSMOLALITY: Osmolality: 310 mOsm/kg — ABNORMAL HIGH (ref 275–295)

## 2020-11-01 MED ORDER — ZIPRASIDONE MESYLATE 20 MG IM SOLR
10.0000 mg | Freq: Once | INTRAMUSCULAR | Status: AC
  Administered 2020-11-01: 10 mg via INTRAMUSCULAR
  Filled 2020-11-01: qty 20

## 2020-11-01 MED ORDER — LACTATED RINGERS IV BOLUS
1000.0000 mL | Freq: Once | INTRAVENOUS | Status: AC
  Administered 2020-11-01: 1000 mL via INTRAVENOUS

## 2020-11-01 MED ORDER — STERILE WATER FOR INJECTION IJ SOLN
INTRAMUSCULAR | Status: AC
  Filled 2020-11-01: qty 10

## 2020-11-01 MED ORDER — LORAZEPAM 2 MG/ML IJ SOLN
2.0000 mg | Freq: Once | INTRAMUSCULAR | Status: AC
  Administered 2020-11-01: 2 mg via INTRAVENOUS
  Filled 2020-11-01: qty 1

## 2020-11-01 MED ORDER — LAMOTRIGINE 150 MG PO TABS
150.0000 mg | ORAL_TABLET | Freq: Once | ORAL | Status: AC
  Administered 2020-11-01: 150 mg via ORAL
  Filled 2020-11-01: qty 1

## 2020-11-01 MED ORDER — CLONAZEPAM 0.5 MG PO TABS
2.0000 mg | ORAL_TABLET | Freq: Once | ORAL | Status: AC
  Administered 2020-11-01: 2 mg via ORAL
  Filled 2020-11-01: qty 4

## 2020-11-01 NOTE — ED Notes (Signed)
Patient/law enforcement officer verbalized understanding of dc instructions, vss. Placed in wheelchair for dc into [employer] dept custody.
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
## 2020-11-01 NOTE — ED Notes (Signed)
ED Provider at bedside. 

## 2020-11-01 NOTE — Discharge Instructions (Addendum)
I believe [name] had multiple seizures today from benzodiazepine withdrawal. I reviewed his PDMP and he has had prescriptions for clonazepam and alprazolam monthly since at least May (the furthest back the database will show me) and regularly. I have printed what I could for your reference. I would definitely suggest do some type of benzodiazepine assisted withdrawal procedure if you can (CIWA should suffice). Also ensure he is receiving his Lamictal please.   He is significantly improved here without traumatic injuries.   He is dehydrated but has had multiple liters of fluids and can continue taking fluid by mouth and should be allowed to drink ad lib.
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
## 2020-11-02 ENCOUNTER — Inpatient Hospital Stay (HOSPITAL_COMMUNITY)
Admission: EM | Admit: 2020-11-02 | Discharge: 2020-11-05 | DRG: 557 | Disposition: A | Discharge status: Home / Self Care | Payer: BLUE CROSS/BLUE SHIELD | Attending: Internal Medicine | Admitting: Internal Medicine

## 2020-11-02 ENCOUNTER — Encounter (HOSPITAL_COMMUNITY): Payer: Self-pay | Admitting: Emergency Medicine

## 2020-11-02 ENCOUNTER — Emergency Department (HOSPITAL_COMMUNITY): Payer: BLUE CROSS/BLUE SHIELD

## 2020-11-02 DIAGNOSIS — R0602 Shortness of breath: Secondary | ICD-10-CM

## 2020-11-02 DIAGNOSIS — E785 Hyperlipidemia, unspecified: Secondary | ICD-10-CM | POA: Diagnosis present

## 2020-11-02 DIAGNOSIS — F3170 Bipolar disorder, currently in remission, most recent episode unspecified: Secondary | ICD-10-CM | POA: Diagnosis not present

## 2020-11-02 DIAGNOSIS — E872 Acidosis, unspecified: Secondary | ICD-10-CM

## 2020-11-02 DIAGNOSIS — U071 COVID-19: Secondary | ICD-10-CM | POA: Diagnosis present

## 2020-11-02 DIAGNOSIS — F13239 Sedative, hypnotic or anxiolytic dependence with withdrawal, unspecified: Secondary | ICD-10-CM | POA: Diagnosis present

## 2020-11-02 DIAGNOSIS — M6282 Rhabdomyolysis: Principal | ICD-10-CM

## 2020-11-02 DIAGNOSIS — F17210 Nicotine dependence, cigarettes, uncomplicated: Secondary | ICD-10-CM | POA: Diagnosis present

## 2020-11-02 DIAGNOSIS — E7220 Disorder of urea cycle metabolism, unspecified: Secondary | ICD-10-CM

## 2020-11-02 DIAGNOSIS — F1290 Cannabis use, unspecified, uncomplicated: Secondary | ICD-10-CM | POA: Diagnosis present

## 2020-11-02 DIAGNOSIS — Z981 Arthrodesis status: Secondary | ICD-10-CM | POA: Diagnosis not present

## 2020-11-02 DIAGNOSIS — D72829 Elevated white blood cell count, unspecified: Secondary | ICD-10-CM

## 2020-11-02 DIAGNOSIS — F319 Bipolar disorder, unspecified: Secondary | ICD-10-CM | POA: Diagnosis present

## 2020-11-02 DIAGNOSIS — K2100 Gastro-esophageal reflux disease with esophagitis, without bleeding: Secondary | ICD-10-CM | POA: Diagnosis not present

## 2020-11-02 DIAGNOSIS — E669 Obesity, unspecified: Secondary | ICD-10-CM | POA: Diagnosis present

## 2020-11-02 DIAGNOSIS — E860 Dehydration: Secondary | ICD-10-CM | POA: Diagnosis present

## 2020-11-02 DIAGNOSIS — S22059A Unspecified fracture of T5-T6 vertebra, initial encounter for closed fracture: Secondary | ICD-10-CM | POA: Diagnosis present

## 2020-11-02 DIAGNOSIS — T796XXA Traumatic ischemia of muscle, initial encounter: Secondary | ICD-10-CM

## 2020-11-02 DIAGNOSIS — R7401 Elevation of levels of liver transaminase levels: Secondary | ICD-10-CM | POA: Diagnosis present

## 2020-11-02 DIAGNOSIS — F1910 Other psychoactive substance abuse, uncomplicated: Secondary | ICD-10-CM | POA: Diagnosis not present

## 2020-11-02 DIAGNOSIS — N400 Enlarged prostate without lower urinary tract symptoms: Secondary | ICD-10-CM | POA: Diagnosis present

## 2020-11-02 DIAGNOSIS — G894 Chronic pain syndrome: Secondary | ICD-10-CM | POA: Diagnosis present

## 2020-11-02 DIAGNOSIS — Z6831 Body mass index (BMI) 31.0-31.9, adult: Secondary | ICD-10-CM | POA: Diagnosis not present

## 2020-11-02 DIAGNOSIS — F419 Anxiety disorder, unspecified: Secondary | ICD-10-CM | POA: Diagnosis present

## 2020-11-02 DIAGNOSIS — R569 Unspecified convulsions: Secondary | ICD-10-CM | POA: Diagnosis present

## 2020-11-02 DIAGNOSIS — F1410 Cocaine abuse, uncomplicated: Secondary | ICD-10-CM | POA: Diagnosis present

## 2020-11-02 DIAGNOSIS — Z720 Tobacco use: Secondary | ICD-10-CM | POA: Diagnosis not present

## 2020-11-02 DIAGNOSIS — K219 Gastro-esophageal reflux disease without esophagitis: Secondary | ICD-10-CM | POA: Diagnosis present

## 2020-11-02 DIAGNOSIS — M546 Pain in thoracic spine: Secondary | ICD-10-CM

## 2020-11-02 DIAGNOSIS — Z79899 Other long term (current) drug therapy: Secondary | ICD-10-CM

## 2020-11-02 DIAGNOSIS — R296 Repeated falls: Secondary | ICD-10-CM | POA: Diagnosis present

## 2020-11-02 DIAGNOSIS — M549 Dorsalgia, unspecified: Secondary | ICD-10-CM | POA: Diagnosis present

## 2020-11-02 DIAGNOSIS — I10 Essential (primary) hypertension: Secondary | ICD-10-CM | POA: Diagnosis present

## 2020-11-02 DIAGNOSIS — R4182 Altered mental status, unspecified: Secondary | ICD-10-CM

## 2020-11-02 DIAGNOSIS — W19XXXA Unspecified fall, initial encounter: Secondary | ICD-10-CM | POA: Diagnosis present

## 2020-11-02 DIAGNOSIS — G8929 Other chronic pain: Secondary | ICD-10-CM | POA: Diagnosis present

## 2020-11-02 DIAGNOSIS — M545 Low back pain, unspecified: Secondary | ICD-10-CM | POA: Diagnosis present

## 2020-11-02 LAB — COMPREHENSIVE METABOLIC PANEL
ALT: 76 U/L — ABNORMAL HIGH (ref 0–44)
AST: 239 U/L — ABNORMAL HIGH (ref 15–41)
Albumin: 4.1 g/dL (ref 3.5–5.0)
Alkaline Phosphatase: 59 U/L (ref 38–126)
Anion gap: 15 (ref 5–15)
BUN: 16 mg/dL (ref 6–20)
CO2: 21 mmol/L — ABNORMAL LOW (ref 22–32)
Calcium: 9.2 mg/dL (ref 8.9–10.3)
Chloride: 100 mmol/L (ref 98–111)
Creatinine, Ser: 0.89 mg/dL (ref 0.61–1.24)
GFR, Estimated: >60 mL/min (ref >60)
Glucose, Bld: 109 mg/dL — ABNORMAL HIGH (ref 70–99)
Potassium: 3.4 mmol/L — ABNORMAL LOW (ref 3.5–5.1)
Sodium: 136 mmol/L (ref 135–145)
Total Bilirubin: 1 mg/dL (ref 0.3–1.2)
Total Protein: 7.1 g/dL (ref 6.5–8.1)

## 2020-11-02 LAB — I-STAT VENOUS BLOOD GAS, ED
Acid-Base Excess: 0 mmol/L (ref 0.0–2.0)
Bicarbonate: 23.8 mmol/L (ref 20.0–28.0)
Calcium, Ion: 1.15 mmol/L (ref 1.15–1.40)
HCT: 42 % (ref 39.0–52.0)
Hemoglobin: 14.3 g/dL (ref 13.0–17.0)
O2 Saturation: 95 %
Potassium: 3.6 mmol/L (ref 3.5–5.1)
Sodium: 137 mmol/L (ref 135–145)
TCO2: 25 mmol/L (ref 22–32)
pCO2, Ven: 33.7 mmHg — ABNORMAL LOW (ref 44.0–60.0)
pH, Ven: 7.456 — ABNORMAL HIGH (ref 7.250–7.430)
pO2, Ven: 72 mmHg — ABNORMAL HIGH (ref 32.0–45.0)

## 2020-11-02 LAB — VITAMIN B12: Vitamin B-12: 233 pg/mL (ref 180–914)

## 2020-11-02 LAB — I-STAT CHEM 8, ED
BUN: 18 mg/dL (ref 6–20)
Calcium, Ion: 1.09 mmol/L — ABNORMAL LOW (ref 1.15–1.40)
Chloride: 102 mmol/L (ref 98–111)
Creatinine, Ser: 0.7 mg/dL (ref 0.61–1.24)
Glucose, Bld: 104 mg/dL — ABNORMAL HIGH (ref 70–99)
HCT: 45 % (ref 39.0–52.0)
Hemoglobin: 15.3 g/dL (ref 13.0–17.0)
Potassium: 3.5 mmol/L (ref 3.5–5.1)
Sodium: 136 mmol/L (ref 135–145)
TCO2: 23 mmol/L (ref 22–32)

## 2020-11-02 LAB — ACETAMINOPHEN LEVEL: Acetaminophen (Tylenol), Serum: <10 ug/mL — ABNORMAL LOW (ref 10–30)

## 2020-11-02 LAB — APTT: aPTT: 32 seconds (ref 24–36)

## 2020-11-02 LAB — URINALYSIS, ROUTINE W REFLEX MICROSCOPIC
Glucose, UA: NEGATIVE mg/dL
Ketones, ur: 40 mg/dL — AB
Leukocytes,Ua: NEGATIVE
Nitrite: NEGATIVE
Protein, ur: 100 mg/dL — AB
Specific Gravity, Urine: >1.03 — ABNORMAL HIGH (ref 1.005–1.030)
pH: 5.5 (ref 5.0–8.0)

## 2020-11-02 LAB — CBC
HCT: 45 % (ref 39.0–52.0)
Hemoglobin: 15.4 g/dL (ref 13.0–17.0)
MCH: 30.9 pg (ref 26.0–34.0)
MCHC: 34.2 g/dL (ref 30.0–36.0)
MCV: 90.2 fL (ref 80.0–100.0)
Platelets: 273 10*3/uL (ref 150–400)
RBC: 4.99 MIL/uL (ref 4.22–5.81)
RDW: 12.8 % (ref 11.5–15.5)
WBC: 14.1 10*3/uL — ABNORMAL HIGH (ref 4.0–10.5)
nRBC: 0 % (ref 0.0–0.2)

## 2020-11-02 LAB — LACTIC ACID, PLASMA
Lactic Acid, Venous: >11 mmol/L (ref 0.5–1.9)
Lactic Acid, Venous: 1.4 mmol/L (ref 0.5–1.9)

## 2020-11-02 LAB — PROTIME-INR
INR: 1.1 (ref 0.8–1.2)
Prothrombin Time: 13.7 seconds (ref 11.4–15.2)

## 2020-11-02 LAB — SALICYLATE LEVEL: Salicylate Lvl: <7 mg/dL — ABNORMAL LOW (ref 7.0–30.0)

## 2020-11-02 LAB — URINALYSIS, MICROSCOPIC (REFLEX): Bacteria, UA: NONE SEEN

## 2020-11-02 LAB — DIFFERENTIAL
Abs Immature Granulocytes: 0.07 10*3/uL (ref 0.00–0.07)
Basophils Absolute: 0 10*3/uL (ref 0.0–0.1)
Basophils Relative: 0 %
Eosinophils Absolute: 0 10*3/uL (ref 0.0–0.5)
Eosinophils Relative: 0 %
Immature Granulocytes: 1 %
Lymphocytes Relative: 6 %
Lymphs Abs: 0.8 10*3/uL (ref 0.7–4.0)
Monocytes Absolute: 1.2 10*3/uL — ABNORMAL HIGH (ref 0.1–1.0)
Monocytes Relative: 9 %
Neutro Abs: 12 10*3/uL — ABNORMAL HIGH (ref 1.7–7.7)
Neutrophils Relative %: 84 %

## 2020-11-02 LAB — RAPID URINE DRUG SCREEN, HOSP PERFORMED
Amphetamines: NOT DETECTED
Barbiturates: NOT DETECTED
Benzodiazepines: POSITIVE — AB
Cocaine: POSITIVE — AB
Opiates: NOT DETECTED
Tetrahydrocannabinol: POSITIVE — AB

## 2020-11-02 LAB — CK: Total CK: 22069 U/L — ABNORMAL HIGH (ref 49–397)

## 2020-11-02 LAB — AMMONIA: Ammonia: 58 umol/L — ABNORMAL HIGH (ref 9–35)

## 2020-11-02 LAB — ETHANOL: Alcohol, Ethyl (B): <10 mg/dL (ref <10)

## 2020-11-02 LAB — LAMOTRIGINE LEVEL: Lamotrigine Lvl: <1 ug/mL — ABNORMAL LOW (ref 2.0–20.0)

## 2020-11-02 MED ORDER — OXYCODONE-ACETAMINOPHEN 5-325 MG PO TABS
1.0000 | ORAL_TABLET | Freq: Once | ORAL | Status: AC
  Administered 2020-11-02: 1 via ORAL
  Filled 2020-11-02: qty 1

## 2020-11-02 MED ORDER — LACTATED RINGERS IV BOLUS
2000.0000 mL | Freq: Once | INTRAVENOUS | Status: AC
  Administered 2020-11-02: 2000 mL via INTRAVENOUS

## 2020-11-02 MED ORDER — LORAZEPAM 2 MG/ML IJ SOLN
2.0000 mg | Freq: Once | INTRAMUSCULAR | Status: AC
  Administered 2020-11-02: 2 mg via INTRAVENOUS
  Filled 2020-11-02: qty 1

## 2020-11-02 MED ORDER — LACTATED RINGERS IV SOLN: INTRAVENOUS | Status: DC

## 2020-11-02 MED ORDER — SODIUM CHLORIDE 0.9% FLUSH
3.0000 mL | Freq: Once | INTRAVENOUS | Status: AC
  Administered 2020-11-02: 3 mL via INTRAVENOUS

## 2020-11-02 NOTE — ED Notes (Signed)
One set of cultures collected

## 2020-11-02 NOTE — ED Notes (Signed)
Ptdenies any drug or alcohol use.
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
## 2020-11-02 NOTE — ED Provider Notes (Signed)
[name] Provider Note   CSN: [id number] Arrival date & time: 11/02/20  1631     History Chief Complaint  Patient presents with  . Altered Mental Status    [name] is a 55 y.o. male.  Presents ER with concern for abnormal behavior.  Per report, patient had seizure episode while in police custody, had a fall and went to the hospital yesterday.  Trauma work-up was negative.  Family concerned that his behavior has been abnormal ever since the fall.  History limited due to mental status change.  Patient was alert and oriented to person place and time but was unable to provide any additional history regarding his current situation.  HPI     Past Medical History:  Diagnosis Date  . Anxiety   . Arthritis    degeneration of spine & scoliosis  . Bipolar disorder ([place])   . GERD (gastroesophageal reflux disease)   . H/O echocardiogram ?2015  . Hyperlipidemia   . Hypertension   . Pneumonia 2002   Second [facility] admission   . Shortness of breath dyspnea   . Sleep apnea    test 3/15-waiting results-was told oxygen did drop- did additional study done at home, told that he doesn't have sleep apnea     Patient Active Problem List   Diagnosis Date Noted  . 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 Current nicotine use 10/26/2018  . Benign neoplasm of ascending colon   . DDD (degenerative disc disease), cervical 12/17/2017  . Lumbar radiculopathy 12/17/2017  . Neck pain 08/17/2015  . Special screening for malignant neoplasms, colon 08/04/2015  . Low testosterone 05/02/2015  . Back pain 04/28/2015  . Hemorrhoid 04/28/2015  . Ganglion cyst of wrist 11/20/2013  . Testosterone deficiency 11/19/2011  . HLD (hyperlipidemia) 10/25/2010  . Bipolar disorder ([place]) 10/25/2010  . TOBACCO ABUSE 10/25/2010  . Essential hypertension 10/25/2010  . GERD 10/25/2010    Past Surgical History:  Procedure Laterality Date  . ANTERIOR CERVICAL  DECOMP/DISCECTOMY FUSION N/A 08/17/2015   Procedure: ANTERIOR CERVICAL DECOMPRESSION/DISCECTOMY FUSION Cervical five-cervical seven. Two LEVELS;  Surgeon: Melina Schools, MD;  Location: [facility];  Service: Orthopedics;  Laterality: N/A;  . BIOPSY  07/15/2018   Procedure: BIOPSY;  Surgeon: Gatha Mayer, MD;  Location: [facility];  Service: Endoscopy;;  . BRONCHOSCOPY    . COLONOSCOPY WITH PROPOFOL N/A 07/15/2018   Procedure: COLONOSCOPY WITH PROPOFOL;  Surgeon: Gatha Mayer, MD;  Location: [facility];  Service: Endoscopy;  Laterality: N/A;  . MASS EXCISION Left 12/17/2013   Procedure: EXCISION MASS LEFT WRIST;  Surgeon: Tennis Must, MD;  Location: [facility];  Service: Orthopedics;  Laterality: Left;  . SEPTOPLASTY  1990       Family History  Problem Relation Age of Onset  . Cancer Mother        melanoma  . Depression Other   . Cancer Other        skin  . Colon polyps Neg Hx   . Esophageal cancer Neg Hx   . Rectal cancer Neg Hx   . Stomach cancer Neg Hx     Social History   Tobacco Use  . Smoking status: Current Every Day Smoker    Packs/day: 0.50    Types: Cigarettes  . Smokeless tobacco: Former Network engineer  . Vaping Use: Former  Substance Use Topics  . Alcohol use: No    Alcohol/week: 0.0 standard drinks  Comment: quit 2007  . Drug use: No    Comment: history cocaine-last 2006    Home Medications Prior to Admission medications   Medication Sig Start Date End Date Taking? Authorizing Provider  albuterol (PROAIR HFA) 108 (90 Base) MCG/ACT inhaler Inhale 1-2 puffs into the lungs every 6 (six) hours as needed for wheezing or shortness of breath. 04/09/18   Elby Beck, FNP  ALPRAZolam Duanne Moron) 1 MG tablet Take 1 mg by mouth 3 (three) times daily as needed for anxiety.     [provider]  B-D 3CC LUER-LOK SYR 22GX1-1/2 22G X 1-1/2" 3 ML MISC  08/14/19   [provider]  clonazePAM (KLONOPIN) 2 MG tablet Take 2 mg by  mouth at bedtime.     [provider]  diphenhydrAMINE (BENADRYL) 25 MG tablet Take 50 mg by mouth at bedtime.     [provider]  fluticasone (FLONASE) 50 MCG/ACT nasal spray Place 2 sprays into both nostrils daily. 04/29/19   Elby Beck, FNP  lamoTRIgine (LAMICTAL) 150 MG tablet Take 150 mg by mouth at bedtime.    [provider]  lansoprazole (PREVACID) 30 MG capsule TAKE 1 CAPSULE BY MOUTH 30 MINUTES BEFORE BREAKFAST AND DINNER DAILY FOR 4 WEEKS, THEN TAKE ONCE A DAY BEFORE DINNER. 08/19/19   Elby Beck, FNP  LATUDA 120 MG TABS Take 120 mg by mouth daily after supper.  08/30/17   [provider]  lisinopril (ZESTRIL) 20 MG tablet Take 1 tablet (20 mg total) by mouth daily. 11/20/19   Elby Beck, FNP  methocarbamol (ROBAXIN) 500 MG tablet Take 1 tablet (500 mg total) by mouth 3 (three) times daily as needed for muscle spasms. Patient taking differently: Take 500 mg by mouth at bedtime.  08/17/15   Melina Schools, MD  naloxone [facility]) 4 MG/0.1ML LIQD nasal spray kit Narcan 4 mg/actuation nasal spray  Take by nasal route every 3 minutes until patient awakes or EMS arrives.    [provider]  oxyCODONE-acetaminophen (PERCOCET) 10-325 MG tablet Take 1 tablet by mouth every 4 (four) hours as needed for pain. Patient taking differently: Take 1 tablet by mouth every 8 (eight) hours as needed for pain.  08/17/15   Melina Schools, MD  pantoprazole (PROTONIX) 40 MG tablet Take 1 tablet (40 mg total) by mouth daily. 11/20/19   Elby Beck, FNP  QUEtiapine (SEROQUEL) 300 MG tablet Take 450 mg by mouth at bedtime.     [provider]  sertraline (ZOLOFT) 100 MG tablet Take 100 mg by mouth daily.     [provider]  sildenafil (REVATIO) 20 MG tablet Take 2-5 tablets (40-100 mg total) by mouth daily as needed (for erectile dysfunction.). 06/02/20   Elby Beck, FNP  simvastatin (ZOCOR) 40 MG tablet TAKE 1 TABLET BY  MOUTH DAILY AT 6 PM. 09/12/20   Elby Beck, FNP  tamsulosin (FLOMAX) 0.4 MG CAPS capsule Take 0.8 mg by mouth at bedtime.     [provider]  terbinafine (LAMISIL) 250 MG tablet Take 1 tablet (250 mg total) by mouth daily. 04/09/18   Elby Beck, FNP  Testosterone 30 MG/ACT SOLN  09/08/18   [provider]  testosterone cypionate (DEPOTESTOTERONE CYPIONATE) 100 MG/ML injection SMARTSIG:1 Milliliter(s) IM Once a Week 11/19/19   [provider]  zolpidem (AMBIEN) 10 MG tablet Take 10 mg by mouth at bedtime.  09/16/17   [provider]    Allergies  Patient has no known allergies.  Review of Systems   Review of Systems  Neurological: Positive for seizures.    Physical Exam Updated Vital Signs BP 137/84   Pulse (!) 102   Temp 98.3 F (36.8 C) (Oral)   Resp (!) 21   SpO2 96%   Physical Exam Vitals and nursing note reviewed.  Constitutional:      Appearance: He is well-developed and well-nourished.     Comments: Somewhat confused but answers basic questions appropriately, no acute distress  HENT:     Head: Normocephalic.  Eyes:     Conjunctiva/sclera: Conjunctivae normal.  Cardiovascular:     Rate and Rhythm: Regular rhythm. Tachycardia present.     Heart sounds: No murmur heard.   Pulmonary:     Effort: Pulmonary effort is normal. No respiratory distress.     Breath sounds: Normal breath sounds.  Abdominal:     Palpations: Abdomen is soft.     Tenderness: There is no abdominal tenderness.  Musculoskeletal:        General: No deformity, signs of injury or edema.     Cervical back: Neck supple.  Skin:    Capillary Refill: Capillary refill takes less than 2 seconds.     Comments: Warm, somewhat diaphoretic  Neurological:     Comments: Alert, oriented to person place and time but not situation  Psychiatric:        Mood and Affect: Mood and affect normal.     ED Results / Procedures / Treatments   Labs (all labs  ordered are listed, but only abnormal results are displayed) Labs Reviewed  CBC - Abnormal; Notable for the following components:      Result Value   WBC 14.1 (*)    All other components within normal limits  DIFFERENTIAL - Abnormal; Notable for the following components:   Neutro Abs 12.0 (*)    Monocytes Absolute 1.2 (*)    All other components within normal limits  COMPREHENSIVE METABOLIC PANEL - Abnormal; Notable for the following components:   Potassium 3.4 (*)    CO2 21 (*)    Glucose, Bld 109 (*)    AST 239 (*)    ALT 76 (*)    All other components within normal limits  AMMONIA - Abnormal; Notable for the following components:   Ammonia 58 (*)    All other components within normal limits  CK - Abnormal; Notable for the following components:   Total CK 22,069 (*)    All other components within normal limits  ACETAMINOPHEN LEVEL - Abnormal; Notable for the following components:   Acetaminophen (Tylenol), Serum <10 (*)    All other components within normal limits  SALICYLATE LEVEL - Abnormal; Notable for the following components:   Salicylate Lvl <6.5 (*)    All other components within normal limits  LACTIC ACID, PLASMA - Abnormal; Notable for the following components:   Lactic Acid, Venous >11.0 (*)    All other components within normal limits  I-STAT CHEM 8, ED - Abnormal; Notable for the following components:   Glucose, Bld 104 (*)    Calcium, Ion 1.09 (*)    All other components within normal limits  CULTURE, BLOOD (ROUTINE X 2)  CULTURE, BLOOD (ROUTINE X 2)  SARS CORONAVIRUS 2 (TAT 6-24 HRS)  PROTIME-INR  APTT  ETHANOL  RAPID URINE DRUG SCREEN, HOSP PERFORMED  LACTIC ACID, PLASMA  VITAMIN B1  URINALYSIS, ROUTINE W REFLEX MICROSCOPIC  VITAMIN B12  BLOOD GAS, VENOUS  CBG MONITORING, ED    EKG EKG Interpretation  Date/Time:  Wednesday November 02 2020 17:35:01 EST Ventricular Rate:  96 PR Interval:  116 QRS Duration: 86 QT Interval:  366 QTC  Calculation: 462 R Axis:   90 Text Interpretation: Normal sinus rhythm Right atrial enlargement Rightward axis Pulmonary disease pattern Abnormal ECG Confirmed by Madalyn Rob [phone]) on 11/02/2020 7:41:45 PM   Radiology CT HEAD WO CONTRAST  Result Date: 11/02/2020 CLINICAL DATA:  Seizure with subsequent fall. EXAM: CT HEAD WITHOUT CONTRAST TECHNIQUE: Contiguous axial images were obtained from the base of the skull through the vertex without intravenous contrast. COMPARISON:  11/01/2020 FINDINGS: Brain: The brain has normal appearance without evidence of atrophy, old or acute infarction, mass lesion, hemorrhage, hydrocephalus or extra-axial collection. Vascular: No abnormal vascular finding. Skull: No skull fracture. Sinuses/Orbits: Mild mucosal inflammatory changes of the sinuses. Orbits negative. Other: Left forehead soft tissue swelling. IMPRESSION: No intracranial abnormality. No skull fracture. Left forehead soft tissue swelling. Electronically Signed   By: Nelson Chimes M.D.   On: 11/02/2020 18:08   CT Head Wo Contrast  Result Date: 11/01/2020 CLINICAL DATA:  New onset seizure EXAM: CT HEAD WITHOUT CONTRAST CT MAXILLOFACIAL WITHOUT CONTRAST CT CERVICAL SPINE WITHOUT CONTRAST TECHNIQUE: Multidetector CT imaging of the head, cervical spine, and maxillofacial structures were performed using the standard protocol without intravenous contrast. Multiplanar CT image reconstructions of the cervical spine and maxillofacial structures were also generated. COMPARISON:  None. FINDINGS: CT HEAD FINDINGS Brain: There is no mass, hemorrhage or extra-axial collection. The size and configuration of the ventricles and extra-axial CSF spaces are normal. The brain parenchyma is normal, without evidence of acute or chronic infarction. Vascular: No abnormal hyperdensity of the major intracranial arteries or dural venous sinuses. No intracranial atherosclerosis. Skull: The visualized skull base, calvarium and extracranial  soft tissues are normal. CT MAXILLOFACIAL FINDINGS Osseous: --Complex facial fracture types: No LeFort, zygomaticomaxillary complex or nasoorbitoethmoidal fracture. --Simple fracture types: None. --Mandible: No fracture or dislocation. Orbits: The globes are intact. Normal appearance of the intra- and extraconal fat. Symmetric extraocular muscles and optic nerves. Sinuses: No fluid levels or advanced mucosal thickening. Soft tissues: Small left frontal scalp hematoma. CT CERVICAL SPINE FINDINGS Alignment: No static subluxation. Facets are aligned. Occipital condyles and the lateral masses of C1-C2 are aligned. Skull base and vertebrae: C5-7 ACDF. Soft tissues and spinal canal: No prevertebral fluid or swelling. No visible canal hematoma. Disc levels: Multilevel left facet hypertrophy. Upper chest: No pneumothorax, pulmonary nodule or pleural effusion. Other: Normal visualized paraspinal cervical soft tissues. IMPRESSION: 1. No acute intracranial abnormality. 2. Small left frontal scalp hematoma without facial or skull fracture. 3. No acute fracture or static subluxation of the cervical spine. Electronically Signed   By: Ulyses Jarred M.D.   On: 11/01/2020 03:41   CT Cervical Spine Wo Contrast  Result Date: 11/01/2020 CLINICAL DATA:  New onset seizure EXAM: CT HEAD WITHOUT CONTRAST CT MAXILLOFACIAL WITHOUT CONTRAST CT CERVICAL SPINE WITHOUT CONTRAST TECHNIQUE: Multidetector CT imaging of the head, cervical spine, and maxillofacial structures were performed using the standard protocol without intravenous contrast. Multiplanar CT image reconstructions of the cervical spine and maxillofacial structures were also generated. COMPARISON:  None. FINDINGS: CT HEAD FINDINGS Brain: There is no mass, hemorrhage or extra-axial collection. The size and configuration of the ventricles and extra-axial CSF spaces are normal. The brain parenchyma is normal, without evidence of acute or chronic infarction. Vascular: No abnormal  hyperdensity of the major intracranial arteries or dural  venous sinuses. No intracranial atherosclerosis. Skull: The visualized skull base, calvarium and extracranial soft tissues are normal. CT MAXILLOFACIAL FINDINGS Osseous: --Complex facial fracture types: No LeFort, zygomaticomaxillary complex or nasoorbitoethmoidal fracture. --Simple fracture types: None. --Mandible: No fracture or dislocation. Orbits: The globes are intact. Normal appearance of the intra- and extraconal fat. Symmetric extraocular muscles and optic nerves. Sinuses: No fluid levels or advanced mucosal thickening. Soft tissues: Small left frontal scalp hematoma. CT CERVICAL SPINE FINDINGS Alignment: No static subluxation. Facets are aligned. Occipital condyles and the lateral masses of C1-C2 are aligned. Skull base and vertebrae: C5-7 ACDF. Soft tissues and spinal canal: No prevertebral fluid or swelling. No visible canal hematoma. Disc levels: Multilevel left facet hypertrophy. Upper chest: No pneumothorax, pulmonary nodule or pleural effusion. Other: Normal visualized paraspinal cervical soft tissues. IMPRESSION: 1. No acute intracranial abnormality. 2. Small left frontal scalp hematoma without facial or skull fracture. 3. No acute fracture or static subluxation of the cervical spine. Electronically Signed   By: Ulyses Jarred M.D.   On: 11/01/2020 03:41   CT Maxillofacial Wo Contrast  Result Date: 11/01/2020 CLINICAL DATA:  New onset seizure EXAM: CT HEAD WITHOUT CONTRAST CT MAXILLOFACIAL WITHOUT CONTRAST CT CERVICAL SPINE WITHOUT CONTRAST TECHNIQUE: Multidetector CT imaging of the head, cervical spine, and maxillofacial structures were performed using the standard protocol without intravenous contrast. Multiplanar CT image reconstructions of the cervical spine and maxillofacial structures were also generated. COMPARISON:  None. FINDINGS: CT HEAD FINDINGS Brain: There is no mass, hemorrhage or extra-axial collection. The size and  configuration of the ventricles and extra-axial CSF spaces are normal. The brain parenchyma is normal, without evidence of acute or chronic infarction. Vascular: No abnormal hyperdensity of the major intracranial arteries or dural venous sinuses. No intracranial atherosclerosis. Skull: The visualized skull base, calvarium and extracranial soft tissues are normal. CT MAXILLOFACIAL FINDINGS Osseous: --Complex facial fracture types: No LeFort, zygomaticomaxillary complex or nasoorbitoethmoidal fracture. --Simple fracture types: None. --Mandible: No fracture or dislocation. Orbits: The globes are intact. Normal appearance of the intra- and extraconal fat. Symmetric extraocular muscles and optic nerves. Sinuses: No fluid levels or advanced mucosal thickening. Soft tissues: Small left frontal scalp hematoma. CT CERVICAL SPINE FINDINGS Alignment: No static subluxation. Facets are aligned. Occipital condyles and the lateral masses of C1-C2 are aligned. Skull base and vertebrae: C5-7 ACDF. Soft tissues and spinal canal: No prevertebral fluid or swelling. No visible canal hematoma. Disc levels: Multilevel left facet hypertrophy. Upper chest: No pneumothorax, pulmonary nodule or pleural effusion. Other: Normal visualized paraspinal cervical soft tissues. IMPRESSION: 1. No acute intracranial abnormality. 2. Small left frontal scalp hematoma without facial or skull fracture. 3. No acute fracture or static subluxation of the cervical spine. Electronically Signed   By: Ulyses Jarred M.D.   On: 11/01/2020 03:41    Procedures Procedures   Medications Ordered in ED Medications  sodium chloride flush (NS) 0.9 % injection 3 mL (has no administration in time range)  lactated ringers infusion (has no administration in time range)  LORazepam (ATIVAN) injection 2 mg (2 mg Intravenous Given 11/02/20 1944)  lactated ringers bolus 2,000 mL (2,000 mLs Intravenous New Bag/Given 11/02/20 2119)    ED Course  I have reviewed the triage  vital signs and the nursing notes.  Pertinent labs & imaging results that were available during my care of the patient were reviewed by me and considered in my medical decision making (see chart for details).    MDM Rules/Calculators/A&P  55 year old male presents to ER with concern for altered mental status.  Recent ER visit for fall, seizure activity.  Per review of chart, suspect benzodiazepine withdrawal contributing.  He was discharged back to facility with plan for CIWA monitoring with as needed Ativan.  Today, patient was initially somewhat agitated, confused.  Became calm with single dose of Ativan.  Repeat CT head was negative. Given worsening symptoms, broadened work up today.  Concerning [place], patient found to have profoundly elevated CK level consistent with rhabdomyolysis.  Lactate also very elevated.  Suspect due to reported seizure activity versus rhabdo.  No fever, doubt sepsis.  Mild elevation in LFTs, likely secondary to rhabdomyolysis.  Will initiate aggressive intravenous fluid resuscitation and admit to the hospitalist service for further management.  Final Clinical Impression(s) / ED Diagnoses Final diagnoses:  Non-traumatic rhabdomyolysis  Lactic acid acidosis  Altered mental status, unspecified altered mental status type  Hyperammonemia (HCC)  Leukocytosis, unspecified type    Rx / DC Orders ED Discharge Orders    None       Lucrezia Starch, MD 11/02/20 2125

## 2020-11-02 NOTE — ED Notes (Signed)
Md aware of pts lactic >11, and ammonia of 58.

## 2020-11-02 NOTE — H&P (Signed)
History and Physical   [name] YBO:[id number] DOB: 09-01-1966 DOA: 11/02/2020  Referring MD/NP/PA: Dr. Roslynn Amble  PCP: Elby Beck, FNP (Inactive)   Outpatient Specialists: None  Patient coming from: Home  Chief Complaint: Altered mental status  HPI: [name] is a 55 y.o. male with medical history significant of GERD, bipolar disorder, hypertension, polysubstance abuse, obstructive sleep apnea who was brought in from home with complaint of a fall yesterday while in police custody.  Patient apparently has remained altered since the fall.  He is fully awake here communicating with some bruises all over.  He has history of polysubstance abuse back in December was positive for multiple substances including cocaine, amphetamines, opiates.  Here in the ER his CK was found to be 22,000.  Also mildly hypoxic with mildly elevated ammonia levels lactic acidosis with lactic acid of 11 on arrival.  Normal Tylenol and salicylate levels.  Patient reported low back pain after the fall.  X-ray not showing age indeterminate T5 and T6 compression fractures.  Patient being admitted with acute rhabdomyolysis probably from his fall yesterday but also likely to be due to drug use.  Urine drug screen currently pending..  ED Course: Temperature 98.3 blood pressure 154/99 pulse 117 respiratory rate of 42 and oxygen sat 96% on room air.  White count 14.1 CO2 21.  Ammonia level is 58 CPK 22,069, lactic acid more than 11 Tylenol and salicylate levels low, glucose 104.  X-ray of the thoracic spine showed age-indeterminate compression deformities at the T5 and T6 endplates.  Head CT without contrast is negative.  Patient being admitted for further evaluation and treatment  Review of Systems: As per HPI otherwise 10 point review of systems negative.    Past Medical History:  Diagnosis Date  . Anxiety   . Arthritis    degeneration of spine & scoliosis  . Bipolar disorder ([place])   . GERD (gastroesophageal  reflux disease)   . H/O echocardiogram ?2015  . Hyperlipidemia   . Hypertension   . Pneumonia 2002   [facility] admission   . Shortness of breath dyspnea   . Sleep apnea    test 3/15-waiting results-was told oxygen did drop- did additional study done at home, told that he doesn't have sleep apnea     Past Surgical History:  Procedure Laterality Date  . ANTERIOR CERVICAL DECOMP/DISCECTOMY FUSION N/A 08/17/2015   Procedure: ANTERIOR CERVICAL DECOMPRESSION/DISCECTOMY FUSION Cervical five-cervical seven. Two LEVELS;  Surgeon: Melina Schools, MD;  Location: [facility];  Service: Orthopedics;  Laterality: N/A;  . BIOPSY  07/15/2018   Procedure: BIOPSY;  Surgeon: Gatha Mayer, MD;  Location: [facility];  Service: Endoscopy;;  . BRONCHOSCOPY    . COLONOSCOPY WITH PROPOFOL N/A 07/15/2018   Procedure: COLONOSCOPY WITH PROPOFOL;  Surgeon: Gatha Mayer, MD;  Location: [facility];  Service: Endoscopy;  Laterality: N/A;  . MASS EXCISION Left 12/17/2013   Procedure: EXCISION MASS LEFT WRIST;  Surgeon: Tennis Must, MD;  Location: [facility];  Service: Orthopedics;  Laterality: Left;  . SEPTOPLASTY  1990     reports that he has been smoking cigarettes. He has been smoking about 0.50 packs per day. He has quit using smokeless tobacco. He reports that he does not drink alcohol and does not use drugs.  No Known Allergies  Family History  Problem Relation Age of Onset  . Cancer Mother        melanoma  . Depression Other   .  Cancer Other        skin  . Colon polyps Neg Hx   . Esophageal cancer Neg Hx   . Rectal cancer Neg Hx   . Stomach cancer Neg Hx      Prior to Admission medications   Medication Sig Start Date End Date Taking? Authorizing Provider  albuterol (PROAIR HFA) 108 (90 Base) MCG/ACT inhaler Inhale 1-2 puffs into the lungs every 6 (six) hours as needed for wheezing or shortness of breath. 04/09/18   Elby Beck, FNP  ALPRAZolam Duanne Moron) 1 MG tablet Take 1  mg by mouth 3 (three) times daily as needed for anxiety.     [provider]  B-D 3CC LUER-LOK SYR 22GX1-1/2 22G X 1-1/2" 3 ML MISC  08/14/19   [provider]  clonazePAM (KLONOPIN) 2 MG tablet Take 2 mg by mouth at bedtime.     [provider]  diphenhydrAMINE (BENADRYL) 25 MG tablet Take 50 mg by mouth at bedtime.     [provider]  fluticasone (FLONASE) 50 MCG/ACT nasal spray Place 2 sprays into both nostrils daily. 04/29/19   Elby Beck, FNP  lamoTRIgine (LAMICTAL) 150 MG tablet Take 150 mg by mouth at bedtime.    [provider]  lansoprazole (PREVACID) 30 MG capsule TAKE 1 CAPSULE BY MOUTH 30 MINUTES BEFORE BREAKFAST AND DINNER DAILY FOR 4 WEEKS, THEN TAKE ONCE A DAY BEFORE DINNER. 08/19/19   Elby Beck, FNP  LATUDA 120 MG TABS Take 120 mg by mouth daily after supper.  08/30/17   [provider]  lisinopril (ZESTRIL) 20 MG tablet Take 1 tablet (20 mg total) by mouth daily. 11/20/19   Elby Beck, FNP  methocarbamol (ROBAXIN) 500 MG tablet Take 1 tablet (500 mg total) by mouth 3 (three) times daily as needed for muscle spasms. Patient taking differently: Take 500 mg by mouth at bedtime.  08/17/15   Melina Schools, MD  naloxone [facility]) 4 MG/0.1ML LIQD nasal spray kit Narcan 4 mg/actuation nasal spray  Take by nasal route every 3 minutes until patient awakes or EMS arrives.    [provider]  oxyCODONE-acetaminophen (PERCOCET) 10-325 MG tablet Take 1 tablet by mouth every 4 (four) hours as needed for pain. Patient taking differently: Take 1 tablet by mouth every 8 (eight) hours as needed for pain.  08/17/15   Melina Schools, MD  pantoprazole (PROTONIX) 40 MG tablet Take 1 tablet (40 mg total) by mouth daily. 11/20/19   Elby Beck, FNP  QUEtiapine (SEROQUEL) 300 MG tablet Take 450 mg by mouth at bedtime.     [provider]  sertraline (ZOLOFT) 100 MG tablet Take 100 mg by mouth daily.      [provider]  sildenafil (REVATIO) 20 MG tablet Take 2-5 tablets (40-100 mg total) by mouth daily as needed (for erectile dysfunction.). 06/02/20   Elby Beck, FNP  simvastatin (ZOCOR) 40 MG tablet TAKE 1 TABLET BY MOUTH DAILY AT 6 PM. 09/12/20   Elby Beck, FNP  tamsulosin (FLOMAX) 0.4 MG CAPS capsule Take 0.8 mg by mouth at bedtime.     [provider]  terbinafine (LAMISIL) 250 MG tablet Take 1 tablet (250 mg total) by mouth daily. 04/09/18   Elby Beck, FNP  Testosterone 30 MG/ACT SOLN  09/08/18   [provider]  testosterone cypionate (DEPOTESTOTERONE CYPIONATE) 100 MG/ML injection SMARTSIG:1 Milliliter(s) IM Once a Week 11/19/19   [provider]  zolpidem (AMBIEN) 10  MG tablet Take 10 mg by mouth at bedtime.  09/16/17   [provider]    Physical Exam: Vitals:   11/02/20 2000 11/02/20 2115 11/02/20 2200 11/02/20 2314  BP: 137/84 (!) 145/101 (!) 135/91   Pulse: (!) 102 (!) 117 88   Resp: (!) 21 20 17    Temp:    98.6 F (37 C)  TempSrc:    Oral  SpO2: 96% 97% 97%       Constitutional: Acutely ill looking, tremulous, no distress Vitals:   11/02/20 2000 11/02/20 2115 11/02/20 2200 11/02/20 2314  BP: 137/84 (!) 145/101 (!) 135/91   Pulse: (!) 102 (!) 117 88   Resp: (!) 21 20 17    Temp:    98.6 F (37 C)  TempSrc:    Oral  SpO2: 96% 97% 97%    Eyes: PERRL, lids and conjunctivae normal ENMT: Mucous membranes are moist. Posterior pharynx clear of any exudate or lesions.Normal dentition.  Neck: normal, supple, no masses, no thyromegaly Respiratory: clear to auscultation bilaterally, no wheezing, no crackles. Normal respiratory effort. No accessory muscle use.  Cardiovascular: Sinus tachycardia, no murmurs / rubs / gallops. No extremity edema. 2+ pedal pulses. No carotid bruits.  Abdomen: no tenderness, no masses palpated. No hepatosplenomegaly. Bowel sounds positive.  Musculoskeletal: no clubbing / cyanosis.  No joint deformity upper and lower extremities. Good ROM, no contractures. Normal muscle tone.  Skin: Multiple skin bruises Neurologic: CN 2-12 grossly intact. Sensation intact, DTR normal. Strength 5/5 in all 4.  Tremulous Psychiatric: Normal judgment and insight. Alert and oriented x 3.  Anxious mood.     Labs on Admission: I have personally reviewed following labs and imaging studies  CBC: Recent Labs  Lab 11/01/20 0128 11/01/20 0731 11/02/20 1751 11/02/20 1816 11/02/20 2203  WBC 23.9*  --  14.1*  --   --   NEUTROABS 21.7*  --  12.0*  --   --   HGB 16.7 15.3 15.4 15.3 14.3  HCT 48.7 45.0 45.0 45.0 42.0  MCV 95.3  --  90.2  --   --   PLT 370  --  273  --   --    Basic Metabolic Panel: Recent Labs  Lab 11/01/20 0731 11/02/20 1751 11/02/20 1816 11/02/20 2203  NA 137 136 136 137  K 3.6 3.4* 3.5 3.6  CL 104 100 102  --   CO2  --  21*  --   --   GLUCOSE 85 109* 104*  --   BUN 18 16 18   --   CREATININE 0.70 0.89 0.70  --   CALCIUM  --  9.2  --   --    GFR: CrCl cannot be calculated (Unknown ideal weight.). Liver Function Tests: Recent Labs  Lab 11/02/20 1751  AST 239*  ALT 76*  ALKPHOS 59  BILITOT 1.0  PROT 7.1  ALBUMIN 4.1   No results for input(s): LIPASE, AMYLASE in the last 168 hours. Recent Labs  Lab 11/02/20 1948  AMMONIA 58*   Coagulation Profile: Recent Labs  Lab 11/02/20 1751  INR 1.1   Cardiac Enzymes: Recent Labs  Lab 11/02/20 1948  CKTOTAL 22,069*   BNP (last 3 results) No results for input(s): PROBNP in the last 8760 hours. HbA1C: No results for input(s): HGBA1C in the last 72 hours. CBG: No results for input(s): GLUCAP in the last 168 hours. Lipid Profile: No results for input(s): CHOL, HDL, LDLCALC, TRIG, CHOLHDL, LDLDIRECT in the last 72 hours. Thyroid Function  Tests: No results for input(s): TSH, T4TOTAL, FREET4, T3FREE, THYROIDAB in the last 72 hours. Anemia Panel: Recent Labs    11/02/20 2010  VITAMINB12 233   Urine  analysis:    Component Value Date/Time   COLORURINE YELLOW 11/02/2020 1951   APPEARANCEUR CLEAR 11/02/2020 1951   LABSPEC >1.030 (H) 11/02/2020 1951   PHURINE 5.5 11/02/2020 [place] 11/02/2020 1951   HGBUR LARGE (A) 11/02/2020 1951   BILIRUBINUR MODERATE (A) 11/02/2020 1951   BILIRUBINUR neg 02/14/2011 1041   KETONESUR 40 (A) 11/02/2020 1951   PROTEINUR 100 (A) 11/02/2020 1951   UROBILINOGEN neg 02/14/2011 1041   NITRITE NEGATIVE 11/02/2020 1951   LEUKOCYTESUR NEGATIVE 11/02/2020 1951   Sepsis Labs: @LABRCNTIP (procalcitonin:4,lacticidven:4) )No results found for this or any previous visit (from the past 240 hour(s)).   Radiological Exams on Admission: DG Thoracic Spine 2 View  Result Date: 11/02/2020 CLINICAL DATA:  Upper back pain EXAM: THORACIC SPINE 2 VIEWS COMPARISON:  Cervical spine CT 11/01/2020, thoracic radiographs 05/02/2015 FINDINGS: The osseous structures appear diffusely demineralized which may limit detection of small or nondisplaced fractures. Twelve rib-bearing thoracic levels. Age-indeterminate compression deformities are noted at the T5 and T6 superior endplates which are new from comparison imaging in 2016. Preservation levocurvature of the upper thoracic levels, apex T4. A more gradual dextrocurvature of the mid to lower thoracic spine, apex at T9-10. no traumatic listhesis. Multilevel diffuse intervertebral disc height loss with discogenic and facet degenerative changes are again seen. Redemonstration of the C5-C7 cervical spinal fusion better detailed on recent CT cervical spine. Included portions of the chest, mediastinum and upper abdomen are unremarkable. IMPRESSION: 1. Age-indeterminate compression deformities at the T5 and T6 superior endplates which are new from comparison imaging in 2016. Correlate with point tenderness and consider cross-sectional imaging as clinically warranted. 2. No traumatic listhesis. 3. Multilevel degenerative changes of the  thoracic spine. 4. The osseous structures appear diffusely demineralized which may limit detection of small or nondisplaced fractures. Electronically Signed   By: Lovena Le M.D.   On: 11/02/2020 22:28   CT HEAD WO CONTRAST  Result Date: 11/02/2020 CLINICAL DATA:  Seizure with subsequent fall. EXAM: CT HEAD WITHOUT CONTRAST TECHNIQUE: Contiguous axial images were obtained from the base of the skull through the vertex without intravenous contrast. COMPARISON:  11/01/2020 FINDINGS: Brain: The brain has normal appearance without evidence of atrophy, old or acute infarction, mass lesion, hemorrhage, hydrocephalus or extra-axial collection. Vascular: No abnormal vascular finding. Skull: No skull fracture. Sinuses/Orbits: Mild mucosal inflammatory changes of the sinuses. Orbits negative. Other: Left forehead soft tissue swelling. IMPRESSION: No intracranial abnormality. No skull fracture. Left forehead soft tissue swelling. Electronically Signed   By: Nelson Chimes M.D.   On: 11/02/2020 18:08   CT Head Wo Contrast  Result Date: 11/01/2020 CLINICAL DATA:  New onset seizure EXAM: CT HEAD WITHOUT CONTRAST CT MAXILLOFACIAL WITHOUT CONTRAST CT CERVICAL SPINE WITHOUT CONTRAST TECHNIQUE: Multidetector CT imaging of the head, cervical spine, and maxillofacial structures were performed using the standard protocol without intravenous contrast. Multiplanar CT image reconstructions of the cervical spine and maxillofacial structures were also generated. COMPARISON:  None. FINDINGS: CT HEAD FINDINGS Brain: There is no mass, hemorrhage or extra-axial collection. The size and configuration of the ventricles and extra-axial CSF spaces are normal. The brain parenchyma is normal, without evidence of acute or chronic infarction. Vascular: No abnormal hyperdensity of the major intracranial arteries or dural venous sinuses. No intracranial atherosclerosis. Skull: The visualized skull base, calvarium and  extracranial soft tissues are  normal. CT MAXILLOFACIAL FINDINGS Osseous: --Complex facial fracture types: No LeFort, zygomaticomaxillary complex or nasoorbitoethmoidal fracture. --Simple fracture types: None. --Mandible: No fracture or dislocation. Orbits: The globes are intact. Normal appearance of the intra- and extraconal fat. Symmetric extraocular muscles and optic nerves. Sinuses: No fluid levels or advanced mucosal thickening. Soft tissues: Small left frontal scalp hematoma. CT CERVICAL SPINE FINDINGS Alignment: No static subluxation. Facets are aligned. Occipital condyles and the lateral masses of C1-C2 are aligned. Skull base and vertebrae: C5-7 ACDF. Soft tissues and spinal canal: No prevertebral fluid or swelling. No visible canal hematoma. Disc levels: Multilevel left facet hypertrophy. Upper chest: No pneumothorax, pulmonary nodule or pleural effusion. Other: Normal visualized paraspinal cervical soft tissues. IMPRESSION: 1. No acute intracranial abnormality. 2. Small left frontal scalp hematoma without facial or skull fracture. 3. No acute fracture or static subluxation of the cervical spine. Electronically Signed   By: Ulyses Jarred M.D.   On: 11/01/2020 03:41   CT Cervical Spine Wo Contrast  Result Date: 11/01/2020 CLINICAL DATA:  New onset seizure EXAM: CT HEAD WITHOUT CONTRAST CT MAXILLOFACIAL WITHOUT CONTRAST CT CERVICAL SPINE WITHOUT CONTRAST TECHNIQUE: Multidetector CT imaging of the head, cervical spine, and maxillofacial structures were performed using the standard protocol without intravenous contrast. Multiplanar CT image reconstructions of the cervical spine and maxillofacial structures were also generated. COMPARISON:  None. FINDINGS: CT HEAD FINDINGS Brain: There is no mass, hemorrhage or extra-axial collection. The size and configuration of the ventricles and extra-axial CSF spaces are normal. The brain parenchyma is normal, without evidence of acute or chronic infarction. Vascular: No abnormal hyperdensity of the  major intracranial arteries or dural venous sinuses. No intracranial atherosclerosis. Skull: The visualized skull base, calvarium and extracranial soft tissues are normal. CT MAXILLOFACIAL FINDINGS Osseous: --Complex facial fracture types: No LeFort, zygomaticomaxillary complex or nasoorbitoethmoidal fracture. --Simple fracture types: None. --Mandible: No fracture or dislocation. Orbits: The globes are intact. Normal appearance of the intra- and extraconal fat. Symmetric extraocular muscles and optic nerves. Sinuses: No fluid levels or advanced mucosal thickening. Soft tissues: Small left frontal scalp hematoma. CT CERVICAL SPINE FINDINGS Alignment: No static subluxation. Facets are aligned. Occipital condyles and the lateral masses of C1-C2 are aligned. Skull base and vertebrae: C5-7 ACDF. Soft tissues and spinal canal: No prevertebral fluid or swelling. No visible canal hematoma. Disc levels: Multilevel left facet hypertrophy. Upper chest: No pneumothorax, pulmonary nodule or pleural effusion. Other: Normal visualized paraspinal cervical soft tissues. IMPRESSION: 1. No acute intracranial abnormality. 2. Small left frontal scalp hematoma without facial or skull fracture. 3. No acute fracture or static subluxation of the cervical spine. Electronically Signed   By: Ulyses Jarred M.D.   On: 11/01/2020 03:41   CT Maxillofacial Wo Contrast  Result Date: 11/01/2020 CLINICAL DATA:  New onset seizure EXAM: CT HEAD WITHOUT CONTRAST CT MAXILLOFACIAL WITHOUT CONTRAST CT CERVICAL SPINE WITHOUT CONTRAST TECHNIQUE: Multidetector CT imaging of the head, cervical spine, and maxillofacial structures were performed using the standard protocol without intravenous contrast. Multiplanar CT image reconstructions of the cervical spine and maxillofacial structures were also generated. COMPARISON:  None. FINDINGS: CT HEAD FINDINGS Brain: There is no mass, hemorrhage or extra-axial collection. The size and configuration of the ventricles  and extra-axial CSF spaces are normal. The brain parenchyma is normal, without evidence of acute or chronic infarction. Vascular: No abnormal hyperdensity of the major intracranial arteries or dural venous sinuses. No intracranial atherosclerosis. Skull: The visualized skull base, calvarium and extracranial soft  tissues are normal. CT MAXILLOFACIAL FINDINGS Osseous: --Complex facial fracture types: No LeFort, zygomaticomaxillary complex or nasoorbitoethmoidal fracture. --Simple fracture types: None. --Mandible: No fracture or dislocation. Orbits: The globes are intact. Normal appearance of the intra- and extraconal fat. Symmetric extraocular muscles and optic nerves. Sinuses: No fluid levels or advanced mucosal thickening. Soft tissues: Small left frontal scalp hematoma. CT CERVICAL SPINE FINDINGS Alignment: No static subluxation. Facets are aligned. Occipital condyles and the lateral masses of C1-C2 are aligned. Skull base and vertebrae: C5-7 ACDF. Soft tissues and spinal canal: No prevertebral fluid or swelling. No visible canal hematoma. Disc levels: Multilevel left facet hypertrophy. Upper chest: No pneumothorax, pulmonary nodule or pleural effusion. Other: Normal visualized paraspinal cervical soft tissues. IMPRESSION: 1. No acute intracranial abnormality. 2. Small left frontal scalp hematoma without facial or skull fracture. 3. No acute fracture or static subluxation of the cervical spine. Electronically Signed   By: Ulyses Jarred M.D.   On: 11/01/2020 03:41    EKG: Independently reviewed.  Sinus rhythm no significant findings.  Assessment/Plan Principal Problem:   Rhabdomyolysis Active Problems:   Bipolar disorder ([place])   Essential hypertension   GERD   Back pain   Current nicotine use   Polysubstance abuse ([place])     #1 acute rhabdomyolysis: Patient will be admitted.  Aggressive hydration.  Follow CPK levels.  We will get urine drug screen to check again for cocaine and other  substances.  #2 polysubstance abuse: Again check urine drug screen and counseled extensively.  #3 tobacco abuse: Counseling will be provided.  Nicotine patch to be added.  #4 chronic low back pain: Patient has age-indeterminate T5-T6 fractures.  PT OT and pain management avoiding significant narcotics.  #5 essential hypertension: Continue home blood pressure management.  #6 GERD: Continue with PPIs   DVT prophylaxis: Lovenox Code Status: Full code Family Communication: No family at bedside Disposition Plan: Home Consults called: None Admission status: Inpatient  Severity of Illness: The appropriate patient status for this patient is INPATIENT. Inpatient status is judged to be reasonable and necessary in order to provide the required intensity of service to ensure the patient's safety. The patient's presenting symptoms, physical exam findings, and initial radiographic and laboratory data in the context of their chronic comorbidities is felt to place them at high risk for further clinical deterioration. Furthermore, it is not anticipated that the patient will be medically stable for discharge from the hospital within 2 midnights of admission. The following factors support the patient status of inpatient.   " The patient's presenting symptoms include altered mental status. " The worrisome physical exam findings include tremulous with skin paresis. " The initial radiographic and laboratory data are worrisome because of elevated CPK. " The chronic co-morbidities include polysubstance abuse.   * I certify that at the point of admission it is my clinical judgment that the patient will require inpatient hospital care spanning beyond 2 midnights from the point of admission due to high intensity of service, high risk for further deterioration and high frequency of surveillance required.Barbette Merino MD [facility] Pager [phone]  If 7PM-7AM, please contact  night-coverage [email or web address] Password [facility]  11/02/2020, 11:16 PM

## 2020-11-02 NOTE — ED Notes (Signed)
Pt reports hitting head when he fell, reports loc.

## 2020-11-03 ENCOUNTER — Inpatient Hospital Stay (HOSPITAL_COMMUNITY): Payer: BLUE CROSS/BLUE SHIELD

## 2020-11-03 ENCOUNTER — Encounter (HOSPITAL_COMMUNITY): Payer: Self-pay | Admitting: Internal Medicine

## 2020-11-03 DIAGNOSIS — R4182 Altered mental status, unspecified: Secondary | ICD-10-CM

## 2020-11-03 DIAGNOSIS — F3170 Bipolar disorder, currently in remission, most recent episode unspecified: Secondary | ICD-10-CM

## 2020-11-03 LAB — HIV ANTIBODY (ROUTINE TESTING W REFLEX): HIV Screen 4th Generation wRfx: NONREACTIVE

## 2020-11-03 LAB — CBC
HCT: 40.3 % (ref 39.0–52.0)
Hemoglobin: 13.9 g/dL (ref 13.0–17.0)
MCH: 31.5 pg (ref 26.0–34.0)
MCHC: 34.5 g/dL (ref 30.0–36.0)
MCV: 91.4 fL (ref 80.0–100.0)
Platelets: 245 10*3/uL (ref 150–400)
RBC: 4.41 MIL/uL (ref 4.22–5.81)
RDW: 12.8 % (ref 11.5–15.5)
WBC: 12 10*3/uL — ABNORMAL HIGH (ref 4.0–10.5)
nRBC: 0 % (ref 0.0–0.2)

## 2020-11-03 LAB — COMPREHENSIVE METABOLIC PANEL
ALT: 59 U/L — ABNORMAL HIGH (ref 0–44)
AST: 168 U/L — ABNORMAL HIGH (ref 15–41)
Albumin: 3 g/dL — ABNORMAL LOW (ref 3.5–5.0)
Alkaline Phosphatase: 44 U/L (ref 38–126)
Anion gap: 9 (ref 5–15)
BUN: 10 mg/dL (ref 6–20)
CO2: 23 mmol/L (ref 22–32)
Calcium: 8.2 mg/dL — ABNORMAL LOW (ref 8.9–10.3)
Chloride: 107 mmol/L (ref 98–111)
Creatinine, Ser: 0.77 mg/dL (ref 0.61–1.24)
GFR, Estimated: >60 mL/min (ref >60)
Glucose, Bld: 86 mg/dL (ref 70–99)
Potassium: 3.3 mmol/L — ABNORMAL LOW (ref 3.5–5.1)
Sodium: 139 mmol/L (ref 135–145)
Total Bilirubin: 0.6 mg/dL (ref 0.3–1.2)
Total Protein: 5.2 g/dL — ABNORMAL LOW (ref 6.5–8.1)

## 2020-11-03 LAB — CK: Total CK: 13253 U/L — ABNORMAL HIGH (ref 49–397)

## 2020-11-03 LAB — C-REACTIVE PROTEIN: CRP: 5.8 mg/dL — ABNORMAL HIGH (ref <1.0)

## 2020-11-03 LAB — TSH: TSH: 1.101 u[IU]/mL (ref 0.350–4.500)

## 2020-11-03 LAB — CREATININE, SERUM
Creatinine, Ser: 0.85 mg/dL (ref 0.61–1.24)
GFR, Estimated: >60 mL/min (ref >60)

## 2020-11-03 LAB — D-DIMER, QUANTITATIVE: D-Dimer, Quant: 0.79 ug/mL-FEU — ABNORMAL HIGH (ref 0.00–0.50)

## 2020-11-03 LAB — SARS CORONAVIRUS 2 (TAT 6-24 HRS): SARS Coronavirus 2: POSITIVE — AB

## 2020-11-03 MED ORDER — NICOTINE 21 MG/24HR TD PT24
21.0000 mg | MEDICATED_PATCH | Freq: Every day | TRANSDERMAL | Status: DC
  Administered 2020-11-03 – 2020-11-05 (×3): 21 mg via TRANSDERMAL
  Filled 2020-11-03 (×3): qty 1

## 2020-11-03 MED ORDER — LAMOTRIGINE 100 MG PO TABS
150.0000 mg | ORAL_TABLET | Freq: Every day | ORAL | Status: DC
  Administered 2020-11-03 – 2020-11-04 (×2): 150 mg via ORAL
  Filled 2020-11-03 (×2): qty 2

## 2020-11-03 MED ORDER — SERTRALINE HCL 100 MG PO TABS
100.0000 mg | ORAL_TABLET | Freq: Every day | ORAL | Status: DC
  Administered 2020-11-03 – 2020-11-05 (×3): 100 mg via ORAL
  Filled 2020-11-03 (×3): qty 1

## 2020-11-03 MED ORDER — LURASIDONE HCL 20 MG PO TABS
120.0000 mg | ORAL_TABLET | Freq: Every day | ORAL | Status: DC
  Administered 2020-11-03 – 2020-11-04 (×2): 120 mg via ORAL
  Filled 2020-11-03 (×2): qty 6

## 2020-11-03 MED ORDER — QUETIAPINE FUMARATE 50 MG PO TABS
450.0000 mg | ORAL_TABLET | Freq: Every day | ORAL | Status: DC
Start: 2020-11-03 — End: 2020-11-05
  Administered 2020-11-03 – 2020-11-04 (×3): 450 mg via ORAL
  Filled 2020-11-03: qty 4
  Filled 2020-11-03: qty 1
  Filled 2020-11-03: qty 4

## 2020-11-03 MED ORDER — SODIUM CHLORIDE 0.9 % IV SOLN
200.0000 mg | Freq: Once | INTRAVENOUS | Status: AC
  Administered 2020-11-03: 200 mg via INTRAVENOUS
  Filled 2020-11-03: qty 40

## 2020-11-03 MED ORDER — ENOXAPARIN SODIUM 40 MG/0.4ML ~~LOC~~ SOLN
40.0000 mg | SUBCUTANEOUS | Status: DC
  Administered 2020-11-03 – 2020-11-05 (×3): 40 mg via SUBCUTANEOUS
  Filled 2020-11-03 (×3): qty 0.4

## 2020-11-03 MED ORDER — OXYCODONE HCL 5 MG PO TABS
10.0000 mg | ORAL_TABLET | Freq: Four times a day (QID) | ORAL | Status: DC | PRN
  Administered 2020-11-03 – 2020-11-05 (×4): 10 mg via ORAL
  Filled 2020-11-03 (×4): qty 2

## 2020-11-03 MED ORDER — POLYETHYLENE GLYCOL 3350 17 G PO PACK
17.0000 g | PACK | Freq: Every day | ORAL | Status: DC
  Filled 2020-11-03 (×2): qty 1

## 2020-11-03 MED ORDER — THIAMINE HCL 100 MG/ML IJ SOLN
Freq: Once | INTRAVENOUS | Status: AC
  Filled 2020-11-03: qty 1000

## 2020-11-03 MED ORDER — PNEUMOCOCCAL VAC POLYVALENT 25 MCG/0.5ML IJ INJ
0.5000 mL | INJECTION | INTRAMUSCULAR | Status: DC | PRN
  Filled 2020-11-03: qty 0.5

## 2020-11-03 MED ORDER — ALPRAZOLAM 0.5 MG PO TABS
1.0000 mg | ORAL_TABLET | Freq: Three times a day (TID) | ORAL | Status: DC | PRN
  Administered 2020-11-03 – 2020-11-04 (×3): 1 mg via ORAL
  Filled 2020-11-03 (×2): qty 2
  Filled 2020-11-03: qty 4

## 2020-11-03 MED ORDER — SODIUM CHLORIDE 0.9 % IV SOLN: INTRAVENOUS | Status: DC

## 2020-11-03 MED ORDER — ONDANSETRON HCL 4 MG PO TABS: 4.0000 mg | ORAL_TABLET | Freq: Four times a day (QID) | ORAL | Status: DC | PRN

## 2020-11-03 MED ORDER — POTASSIUM CHLORIDE CRYS ER 20 MEQ PO TBCR
40.0000 meq | EXTENDED_RELEASE_TABLET | Freq: Once | ORAL | Status: AC
  Administered 2020-11-03: 40 meq via ORAL
  Filled 2020-11-03: qty 2

## 2020-11-03 MED ORDER — ZOLPIDEM TARTRATE 5 MG PO TABS
10.0000 mg | ORAL_TABLET | Freq: Every day | ORAL | Status: DC
Start: 2020-11-03 — End: 2020-11-05
  Administered 2020-11-03 – 2020-11-04 (×3): 10 mg via ORAL
  Filled 2020-11-03 (×3): qty 2

## 2020-11-03 MED ORDER — ONDANSETRON HCL 4 MG/2ML IJ SOLN: 4.0000 mg | Freq: Four times a day (QID) | INTRAMUSCULAR | Status: DC | PRN

## 2020-11-03 MED ORDER — CLONAZEPAM 1 MG PO TABS
2.0000 mg | ORAL_TABLET | Freq: Every day | ORAL | Status: DC
Start: 2020-11-03 — End: 2020-11-05
  Administered 2020-11-03 – 2020-11-04 (×3): 2 mg via ORAL
  Filled 2020-11-03: qty 2
  Filled 2020-11-03: qty 4
  Filled 2020-11-03: qty 2

## 2020-11-03 MED ORDER — PANTOPRAZOLE SODIUM 40 MG PO TBEC
40.0000 mg | DELAYED_RELEASE_TABLET | Freq: Every day | ORAL | Status: DC
  Administered 2020-11-03 – 2020-11-05 (×3): 40 mg via ORAL
  Filled 2020-11-03 (×4): qty 1

## 2020-11-03 MED ORDER — SODIUM CHLORIDE 0.9 % IV SOLN
100.0000 mg | Freq: Every day | INTRAVENOUS | Status: AC
  Administered 2020-11-04 – 2020-11-05 (×2): 100 mg via INTRAVENOUS
  Filled 2020-11-03 (×2): qty 20

## 2020-11-03 MED ORDER — TAMSULOSIN HCL 0.4 MG PO CAPS
0.8000 mg | ORAL_CAPSULE | Freq: Every day | ORAL | Status: DC
  Administered 2020-11-03 – 2020-11-04 (×2): 0.8 mg via ORAL
  Filled 2020-11-03 (×2): qty 2

## 2020-11-03 NOTE — ED Notes (Signed)
Breakfast Ordered 

## 2020-11-03 NOTE — Progress Notes (Signed)
Pharmacy Antibiotic Note  [name] is a 55 y.o. male admitted on 11/02/2020 with AMS, rhabdo, and incidental COVID infection.  Pharmacy has been consulted for remdesvir dosing X 3 days.  AST/ALT 168/59 (transaminitis likely secondary to rhabdo, per provider note)  Plan: Remdesivir 200 mg IV X 1, followed by remdesivir 100 mg IV daily X 2 days Monitor AST/ALT while on remdesivir  Temp (24hrs), Avg:98.4 F (36.9 C), Min:97.8 F (36.6 C), Max:98.7 F (37.1 C)  Recent Labs  Lab 11/01/20 0128 11/01/20 0731 11/02/20 1751 11/02/20 1816 11/02/20 1953 11/02/20 2150 11/03/20 0229 11/03/20 0256 11/03/20 0815  WBC 23.9*  --  14.1*  --   --   --   --  12.0*  --   CREATININE  --  0.70 0.89 0.70  --   --  0.85  --  0.77  LATICACIDVEN  --   --   --   --  >11.0* 1.4  --   --   --     CrCl cannot be calculated (Unknown ideal weight.).    No Known Allergies  Thank you for allowing pharmacy to be a part of this patient's care.  Gillermina Hu, PharmD, BCPS, [facility] Clinical Pharmacist 11/03/2020 3:44 PM

## 2020-11-03 NOTE — Progress Notes (Signed)
PROGRESS NOTE                                                                                                                                                                                                             Patient Demographics:    [name], is a 55 y.o. male, DOB - 1965/11/29, FTD:[id number]  Outpatient Primary MD for the patient is Elby Beck, FNP (Inactive)   Admit date - 11/02/2020   LOS - 1  Chief Complaint  Patient presents with  . Altered Mental Status       Brief Narrative: Patient is a 55 y.o. male with PMHx of bipolar disorder, BPH, HLD, HTN, chronic pain syndrome, cocaine/marijuana use-presenting to the hospital with frequent falls-found to have rhabdomyolysis and incidental COVID infection.  COVID-19 vaccinated status: Vaccinated but not boosted  Significant Events: 2/8>> brought to ED from jail for seizures x3-discharged on tapering benzos. 2/9>> frequent falls at home- rhabdomyolysis-incidental COVID-19 infection-admit to [place].  Significant studies: 2/9>> CT head: No acute intracranial abnormality 2/9>> UDS positive for benzodiazepines, cocaine and tetrahydrocannabinol 2/10>>Chest x-ray: No pneumonia  COVID-19 medications:    Antibiotics: None  Microbiology data: 2/9 >>blood culture: Pending  Procedures: None  Consults: None  DVT prophylaxis: enoxaparin (LOVENOX) injection 40 mg Start: 11/03/20 1000    Subjective:    [name] today complains of back/thigh pain bilaterally   Assessment  & Plan :   Rhabdomyolysis: Due to cocaine use/recent seizures-continue IV fluid hydration-recheck CK levels in a.m.  Avoid electrolytes today.  Transaminitis: Likely secondary to rhabdomyolysis-await LFTs this a.m.  Seizures: Occurred on 2/8 when he was in police custody/jail-and did not take his benzodiazepine/l psych meds for 3 days.  Suspect benzodiazepine withdrawal  seizures.  CT head without any acute abnormalities.  Check EEG.  I have placed him back on his usual benzodiazepine regimen of Xanax 3 times daily as needed and scheduled Klonopin in the evening.  Watch closely-RN aware not to give if patient appears sedated.  Chronic pain syndrome: Claims he has chronic back pain-and on Percocet chronically-claims he follows with pain management at [facility].  LFTs not available today-had some transaminitis yesterday-so we will avoid Tylenol-and just place him on oxycodone.  At risk for withdrawal symptoms as he has been on narcotics chronically for years.  Bipolar disorder: Stable-claims he takes  Latuda/Zoloft/Seroquel and lamotrigine-I have resumed these medications.  Follow closely.  BPH: Resume Flomax  HLD: Hold statins-await LFTs.  HTN: BP stable-lisinopril on hold-resume when able  Polysubstance abuse: Acknowledges daily marijuana use-very vague about how frequently he does cocaine-denies regular alcohol use any longer.  Counseled extensively this morning  Incidental COVID-19 infection: Asymptomatic-not hypoxic-give electrolyte/LFTs stable-patient is agreeable to start 3 days of Remdesivir.   Lab Results  Component Value Date   [place] (A) 11/02/2020   [place] NEGATIVE 09/07/2020   [place] Not Detected 07/31/2019    Obesity: Estimated body mass index is 31.57 kg/m as calculated from the following:   Height as of 11/20/19: 5' 8.75" (1.746 m).   Weight as of 11/20/19: 96.3 kg.    GI prophylaxis: PPI  ABG:    Component Value Date/Time   HCO3 23.8 11/02/2020 2203   TCO2 25 11/02/2020 2203   O2SAT 95.0 11/02/2020 2203    Vent Settings: N/A    Condition - Stable  Family Communication  :  Spouse-[phone] updated over the phone on 2/10  Code Status :  Full Code  Diet :  Diet Order            Diet regular Room service appropriate? Yes; Fluid consistency: Thin  Diet effective now                   Disposition Plan  :   Status is: Inpatient  Remains inpatient appropriate because:Inpatient level of care appropriate due to severity of illness   Dispo: The patient is from: Home              Anticipated d/c is to: Home              Anticipated d/c date is: 2 days              Patient currently is not medically stable to d/c.   Difficult to place patient No   Barriers to discharge: Rhabdomyolysis requiring IV fluids-seizure work-up pending.  Antimicorbials  :    Anti-infectives (From admission, onward)   None      Inpatient Medications  Scheduled Meds: . clonazePAM  2 mg Oral QHS  . enoxaparin (LOVENOX) injection  40 mg Subcutaneous Q24H  . lamoTRIgine  150 mg Oral QHS  . lurasidone  120 mg Oral QPC supper  . pantoprazole  40 mg Oral Daily  . polyethylene glycol  17 g Oral Daily  . QUEtiapine  450 mg Oral QHS  . sertraline  100 mg Oral Daily  . tamsulosin  0.8 mg Oral QHS  . zolpidem  10 mg Oral QHS   Continuous Infusions: . sodium chloride 150 mL/hr at 11/03/20 0326   PRN Meds:.ALPRAZolam, ondansetron **OR** ondansetron (ZOFRAN) IV, oxyCODONE   Time Spent in minutes  25  See all Orders from today for further details   Oren Binet M.D on 11/03/2020 at 9:49 AM  To page go to [email or web address] - use universal password  [facility] -  Office  [phone]    Objective:   Vitals:   11/03/20 0545 11/03/20 0600 11/03/20 0700 11/03/20 0800  BP: 129/84 129/85 130/84 129/90  Pulse: 89 87 100 (!) 110  Resp: (!) 22 (!) 28 (!) 23 15  Temp:   98.4 F (36.9 C)   TempSrc:      SpO2: 97% 95% 96% 95%    Wt Readings from Last 3 Encounters:  11/20/19 96.3 kg  04/29/19 102.1 kg  10/24/18 101.6 kg  Intake/Output Summary (Last 24 hours) at 11/03/2020 0949 Last data filed at 11/03/2020 0902 Gross per 24 hour  Intake --  Output 600 ml  Net -600 ml     Physical Exam Gen Exam:Alert awake-not in any distress HEENT:atraumatic, normocephalic Chest:  B/L clear to auscultation anteriorly CVS:S1S2 regular Abdomen:soft non tender, non distended Extremities:no edema Neurology: Non focal Skin: no rash   Data Review:    CBC Recent Labs  Lab 11/01/20 0128 11/01/20 0731 11/02/20 1751 11/02/20 1816 11/02/20 2203 11/03/20 0256  WBC 23.9*  --  14.1*  --   --  12.0*  HGB 16.7 15.3 15.4 15.3 14.3 13.9  HCT 48.7 45.0 45.0 45.0 42.0 40.3  PLT 370  --  273  --   --  245  MCV 95.3  --  90.2  --   --  91.4  MCH 32.7  --  30.9  --   --  31.5  MCHC 34.3  --  34.2  --   --  34.5  RDW 13.2  --  12.8  --   --  12.8  LYMPHSABS 1.0  --  0.8  --   --   --   MONOABS 1.0  --  1.2*  --   --   --   EOSABS 0.0  --  0.0  --   --   --   BASOSABS 0.1  --  0.0  --   --   --     Chemistries  Recent Labs  Lab 11/01/20 0731 11/02/20 1751 11/02/20 1816 11/02/20 2203 11/03/20 0229  NA 137 136 136 137  --   K 3.6 3.4* 3.5 3.6  --   CL 104 100 102  --   --   CO2  --  21*  --   --   --   GLUCOSE 85 109* 104*  --   --   BUN 18 16 18   --   --   CREATININE 0.70 0.89 0.70  --  0.85  CALCIUM  --  9.2  --   --   --   AST  --  239*  --   --   --   ALT  --  76*  --   --   --   ALKPHOS  --  59  --   --   --   BILITOT  --  1.0  --   --   --    ------------------------------------------------------------------------------------------------------------------ No results for input(s): CHOL, HDL, LDLCALC, TRIG, CHOLHDL, LDLDIRECT in the last 72 hours.  Lab Results  Component Value Date   HGBA1C 5.4 10/24/2018   ------------------------------------------------------------------------------------------------------------------ Recent Labs    11/03/20 0229  TSH 1.101   ------------------------------------------------------------------------------------------------------------------ Recent Labs    11/02/20 2010  VITAMINB12 233    Coagulation profile Recent Labs  Lab 11/02/20 1751  INR 1.1    No results for input(s): DDIMER in the last 72  hours.  Cardiac Enzymes No results for input(s): CKMB, TROPONINI, MYOGLOBIN in the last 168 hours.  Invalid input(s): CK ------------------------------------------------------------------------------------------------------------------ No results found for: BNP  Micro Results Recent Results (from the past 240 hour(s))  SARS CORONAVIRUS 2 (TAT 6-24 HRS) Nasopharyngeal Nasopharyngeal Swab     Status: Abnormal   Collection Time: 11/02/20  9:17 PM   Specimen: Nasopharyngeal Swab  Result Value Ref Range Status   SARS Coronavirus 2 POSITIVE (A) NEGATIVE Final    Comment: (NOTE) SARS-CoV-2 target nucleic acids are DETECTED.  The SARS-CoV-2 RNA  is generally detectable in upper and lower respiratory specimens during the acute phase of infection. Positive results are indicative of the presence of SARS-CoV-2 RNA. Clinical correlation with patient history and other diagnostic information is  necessary to determine patient infection status. Positive results do not rule out bacterial infection or co-infection with other viruses.  The expected result is Negative.  Fact Sheet for Patients: SugarRoll.be  Fact Sheet for Healthcare Providers: https://www.woods-mathews.com/  This test is not yet approved or cleared by the [place] FDA and  has been authorized for detection and/or diagnosis of SARS-CoV-2 by FDA under an Emergency Use Authorization (EUA). This EUA will remain  in effect (meaning this test can be used) for the duration of the COVID-19 declaration under Section 564(b)(1) of the Act, 21 U. S.C. section 360bbb-3(b)(1), unless the authorization is terminated or revoked sooner.   Performed at [facility], [place] [address], [place], [place] [address]   Blood culture (routine x 2)     Status: None (Preliminary result)   Collection Time: 11/02/20  9:30 PM   Specimen: BLOOD RIGHT HAND  Result Value Ref Range Status   Specimen  Description BLOOD RIGHT HAND  Final   Special Requests   Final    BOTTLES DRAWN AEROBIC AND ANAEROBIC Blood Culture adequate volume   Culture   Final    NO GROWTH < 12 HOURS Performed at [facility], [place] [address] [place], [place] [address]    Report Status PENDING  Incomplete    Radiology Reports DG Thoracic Spine 2 View  Result Date: 11/02/2020 CLINICAL DATA:  Upper back pain EXAM: THORACIC SPINE 2 VIEWS COMPARISON:  Cervical spine CT 11/01/2020, thoracic radiographs 05/02/2015 FINDINGS: The osseous structures appear diffusely demineralized which may limit detection of small or nondisplaced fractures. Twelve rib-bearing thoracic levels. Age-indeterminate compression deformities are noted at the T5 and T6 superior endplates which are new from comparison imaging in 2016. Preservation levocurvature of the upper thoracic levels, apex T4. A more gradual dextrocurvature of the mid to lower thoracic spine, apex at T9-10. no traumatic listhesis. Multilevel diffuse intervertebral disc height loss with discogenic and facet degenerative changes are again seen. Redemonstration of the C5-C7 cervical spinal fusion better detailed on recent CT cervical spine. Included portions of the chest, mediastinum and upper abdomen are unremarkable. IMPRESSION: 1. Age-indeterminate compression deformities at the T5 and T6 superior endplates which are new from comparison imaging in 2016. Correlate with point tenderness and consider cross-sectional imaging as clinically warranted. 2. No traumatic listhesis. 3. Multilevel degenerative changes of the thoracic spine. 4. The osseous structures appear diffusely demineralized which may limit detection of small or nondisplaced fractures. Electronically Signed   By: Lovena Le M.D.   On: 11/02/2020 22:28   CT HEAD WO CONTRAST  Result Date: 11/02/2020 CLINICAL DATA:  Seizure with subsequent fall. EXAM: CT HEAD WITHOUT CONTRAST TECHNIQUE: Contiguous axial images were obtained  from the base of the skull through the vertex without intravenous contrast. COMPARISON:  11/01/2020 FINDINGS: Brain: The brain has normal appearance without evidence of atrophy, old or acute infarction, mass lesion, hemorrhage, hydrocephalus or extra-axial collection. Vascular: No abnormal vascular finding. Skull: No skull fracture. Sinuses/Orbits: Mild mucosal inflammatory changes of the sinuses. Orbits negative. Other: Left forehead soft tissue swelling. IMPRESSION: No intracranial abnormality. No skull fracture. Left forehead soft tissue swelling. Electronically Signed   By: Nelson Chimes M.D.   On: 11/02/2020 18:08   CT Head Wo Contrast  Result Date: 11/01/2020  CLINICAL DATA:  New onset seizure EXAM: CT HEAD WITHOUT CONTRAST CT MAXILLOFACIAL WITHOUT CONTRAST CT CERVICAL SPINE WITHOUT CONTRAST TECHNIQUE: Multidetector CT imaging of the head, cervical spine, and maxillofacial structures were performed using the standard protocol without intravenous contrast. Multiplanar CT image reconstructions of the cervical spine and maxillofacial structures were also generated. COMPARISON:  None. FINDINGS: CT HEAD FINDINGS Brain: There is no mass, hemorrhage or extra-axial collection. The size and configuration of the ventricles and extra-axial CSF spaces are normal. The brain parenchyma is normal, without evidence of acute or chronic infarction. Vascular: No abnormal hyperdensity of the major intracranial arteries or dural venous sinuses. No intracranial atherosclerosis. Skull: The visualized skull base, calvarium and extracranial soft tissues are normal. CT MAXILLOFACIAL FINDINGS Osseous: --Complex facial fracture types: No LeFort, zygomaticomaxillary complex or nasoorbitoethmoidal fracture. --Simple fracture types: None. --Mandible: No fracture or dislocation. Orbits: The globes are intact. Normal appearance of the intra- and extraconal fat. Symmetric extraocular muscles and optic nerves. Sinuses: No fluid levels or  advanced mucosal thickening. Soft tissues: Small left frontal scalp hematoma. CT CERVICAL SPINE FINDINGS Alignment: No static subluxation. Facets are aligned. Occipital condyles and the lateral masses of C1-C2 are aligned. Skull base and vertebrae: C5-7 ACDF. Soft tissues and spinal canal: No prevertebral fluid or swelling. No visible canal hematoma. Disc levels: Multilevel left facet hypertrophy. Upper chest: No pneumothorax, pulmonary nodule or pleural effusion. Other: Normal visualized paraspinal cervical soft tissues. IMPRESSION: 1. No acute intracranial abnormality. 2. Small left frontal scalp hematoma without facial or skull fracture. 3. No acute fracture or static subluxation of the cervical spine. Electronically Signed   By: Ulyses Jarred M.D.   On: 11/01/2020 03:41   CT Cervical Spine Wo Contrast  Result Date: 11/01/2020 CLINICAL DATA:  New onset seizure EXAM: CT HEAD WITHOUT CONTRAST CT MAXILLOFACIAL WITHOUT CONTRAST CT CERVICAL SPINE WITHOUT CONTRAST TECHNIQUE: Multidetector CT imaging of the head, cervical spine, and maxillofacial structures were performed using the standard protocol without intravenous contrast. Multiplanar CT image reconstructions of the cervical spine and maxillofacial structures were also generated. COMPARISON:  None. FINDINGS: CT HEAD FINDINGS Brain: There is no mass, hemorrhage or extra-axial collection. The size and configuration of the ventricles and extra-axial CSF spaces are normal. The brain parenchyma is normal, without evidence of acute or chronic infarction. Vascular: No abnormal hyperdensity of the major intracranial arteries or dural venous sinuses. No intracranial atherosclerosis. Skull: The visualized skull base, calvarium and extracranial soft tissues are normal. CT MAXILLOFACIAL FINDINGS Osseous: --Complex facial fracture types: No LeFort, zygomaticomaxillary complex or nasoorbitoethmoidal fracture. --Simple fracture types: None. --Mandible: No fracture or  dislocation. Orbits: The globes are intact. Normal appearance of the intra- and extraconal fat. Symmetric extraocular muscles and optic nerves. Sinuses: No fluid levels or advanced mucosal thickening. Soft tissues: Small left frontal scalp hematoma. CT CERVICAL SPINE FINDINGS Alignment: No static subluxation. Facets are aligned. Occipital condyles and the lateral masses of C1-C2 are aligned. Skull base and vertebrae: C5-7 ACDF. Soft tissues and spinal canal: No prevertebral fluid or swelling. No visible canal hematoma. Disc levels: Multilevel left facet hypertrophy. Upper chest: No pneumothorax, pulmonary nodule or pleural effusion. Other: Normal visualized paraspinal cervical soft tissues. IMPRESSION: 1. No acute intracranial abnormality. 2. Small left frontal scalp hematoma without facial or skull fracture. 3. No acute fracture or static subluxation of the cervical spine. Electronically Signed   By: Ulyses Jarred M.D.   On: 11/01/2020 03:41   DG Chest Port 1V same Day  Result Date: 11/03/2020 CLINICAL  DATA:  Shortness of breath with altered mental status. EXAM: PORTABLE CHEST 1 VIEW COMPARISON:  10/07/2014 FINDINGS: 0740 hours. The lungs are clear without focal pneumonia, edema, pneumothorax or pleural effusion. The cardiopericardial silhouette is within normal limits for size. The visualized bony structures of the thorax show no acute abnormality. Telemetry leads overlie the chest. IMPRESSION: No active disease. Electronically Signed   By: Misty Stanley M.D.   On: 11/03/2020 08:17   CT Maxillofacial Wo Contrast  Result Date: 11/01/2020 CLINICAL DATA:  New onset seizure EXAM: CT HEAD WITHOUT CONTRAST CT MAXILLOFACIAL WITHOUT CONTRAST CT CERVICAL SPINE WITHOUT CONTRAST TECHNIQUE: Multidetector CT imaging of the head, cervical spine, and maxillofacial structures were performed using the standard protocol without intravenous contrast. Multiplanar CT image reconstructions of the cervical spine and maxillofacial  structures were also generated. COMPARISON:  None. FINDINGS: CT HEAD FINDINGS Brain: There is no mass, hemorrhage or extra-axial collection. The size and configuration of the ventricles and extra-axial CSF spaces are normal. The brain parenchyma is normal, without evidence of acute or chronic infarction. Vascular: No abnormal hyperdensity of the major intracranial arteries or dural venous sinuses. No intracranial atherosclerosis. Skull: The visualized skull base, calvarium and extracranial soft tissues are normal. CT MAXILLOFACIAL FINDINGS Osseous: --Complex facial fracture types: No LeFort, zygomaticomaxillary complex or nasoorbitoethmoidal fracture. --Simple fracture types: None. --Mandible: No fracture or dislocation. Orbits: The globes are intact. Normal appearance of the intra- and extraconal fat. Symmetric extraocular muscles and optic nerves. Sinuses: No fluid levels or advanced mucosal thickening. Soft tissues: Small left frontal scalp hematoma. CT CERVICAL SPINE FINDINGS Alignment: No static subluxation. Facets are aligned. Occipital condyles and the lateral masses of C1-C2 are aligned. Skull base and vertebrae: C5-7 ACDF. Soft tissues and spinal canal: No prevertebral fluid or swelling. No visible canal hematoma. Disc levels: Multilevel left facet hypertrophy. Upper chest: No pneumothorax, pulmonary nodule or pleural effusion. Other: Normal visualized paraspinal cervical soft tissues. IMPRESSION: 1. No acute intracranial abnormality. 2. Small left frontal scalp hematoma without facial or skull fracture. 3. No acute fracture or static subluxation of the cervical spine. Electronically Signed   By: Ulyses Jarred M.D.   On: 11/01/2020 03:41

## 2020-11-03 NOTE — ED Notes (Signed)
Pt is chronic pain pt at [facility]- also takes xanax every day tid.

## 2020-11-03 NOTE — ED Notes (Signed)
Pt put on 1 L [place] for comfort

## 2020-11-03 NOTE — ED Notes (Signed)
md notified of pts elevated ck

## 2020-11-03 NOTE — Procedures (Signed)
Patient Name: [name]  MRN: [id number]  Epilepsy Attending: Lora Havens  Referring Physician/Provider: Dr Oren Binet Date: 11/03/2020 Duration: 25.29 mins  Patient history: 55yo M with seizure like episode in setting of missing benzos. EEG to evaluate for seizure  Level of alertness: Awake  AEDs during EEG study: Klonopin  Technical aspects: This EEG study was done with scalp electrodes positioned according to the 10-20 International system of electrode placement. Electrical activity was acquired at a sampling rate of 500Hz  and reviewed with a high frequency filter of 70Hz  and a low frequency filter of 1Hz . EEG data were recorded continuously and digitally stored.   Description: The posterior dominant rhythm consists of 8 Hz activity of moderate voltage (25-35 uV) seen predominantly in posterior head regions, symmetric and reactive to eye opening and eye closing. There is an excessive amount of 15 to 18 Hz, 2-3 uV beta activity distributed symmetrically and diffusely. Hyperventilation and photic stimulation were not performed.     IMPRESSION: This study is within normal limits. No seizures or epileptiform discharges were seen throughout the recording.  Ruchi Stoney Barbra Sarks

## 2020-11-03 NOTE — ED Notes (Signed)
Pt is acting different. He is having delayed in his responses, only a&o to self, staring at me with no response when I ask questions, pt feels like something is off .Md notified.

## 2020-11-03 NOTE — Progress Notes (Signed)
EEG complete - results pending 

## 2020-11-04 LAB — COMPREHENSIVE METABOLIC PANEL
ALT: 62 U/L — ABNORMAL HIGH (ref 0–44)
AST: 164 U/L — ABNORMAL HIGH (ref 15–41)
Albumin: 2.8 g/dL — ABNORMAL LOW (ref 3.5–5.0)
Alkaline Phosphatase: 42 U/L (ref 38–126)
Anion gap: 8 (ref 5–15)
BUN: 9 mg/dL (ref 6–20)
CO2: 24 mmol/L (ref 22–32)
Calcium: 8.3 mg/dL — ABNORMAL LOW (ref 8.9–10.3)
Chloride: 106 mmol/L (ref 98–111)
Creatinine, Ser: 0.76 mg/dL (ref 0.61–1.24)
GFR, Estimated: >60 mL/min (ref >60)
Glucose, Bld: 86 mg/dL (ref 70–99)
Potassium: 3.7 mmol/L (ref 3.5–5.1)
Sodium: 138 mmol/L (ref 135–145)
Total Bilirubin: 0.6 mg/dL (ref 0.3–1.2)
Total Protein: 5.1 g/dL — ABNORMAL LOW (ref 6.5–8.1)

## 2020-11-04 LAB — CK: Total CK: 10624 U/L — ABNORMAL HIGH (ref 49–397)

## 2020-11-04 LAB — D-DIMER, QUANTITATIVE: D-Dimer, Quant: 0.73 ug/mL-FEU — ABNORMAL HIGH (ref 0.00–0.50)

## 2020-11-04 LAB — C-REACTIVE PROTEIN: CRP: 4.9 mg/dL — ABNORMAL HIGH (ref <1.0)

## 2020-11-04 MED ORDER — OXYMETAZOLINE HCL 0.05 % NA SOLN
1.0000 | Freq: Two times a day (BID) | NASAL | Status: DC | PRN
  Filled 2020-11-04: qty 30

## 2020-11-04 NOTE — Progress Notes (Signed)
Pt ambulated in hall 100 ft.+. Front wheel walker used at first, then pt said that he could go without. Pt said he was 'a little lightheaded'. Pt told to slow down and take pauses when needed. Pt verbalized understanding and completed walk without incident. Pt. Returned to bed. Call bell placed w/i reach. Will continue to monitor.

## 2020-11-04 NOTE — Progress Notes (Addendum)
PROGRESS NOTE                                                                                                                                                                                                             Patient Demographics:    [name], is a 55 y.o. male, DOB - 07-20-1966, YIR:[id number]  Outpatient Primary MD for the patient is Elby Beck, FNP (Inactive)   Admit date - 11/02/2020   LOS - 2  Chief Complaint  Patient presents with  . Altered Mental Status       Brief Narrative: Patient is a 55 y.o. male with PMHx of bipolar disorder, BPH, HLD, HTN, chronic pain syndrome, cocaine/marijuana use-presenting to the hospital with frequent falls-found to have rhabdomyolysis and incidental COVID infection.  COVID-19 vaccinated status: Vaccinated but not boosted  Significant Events: 2/8>> brought to ED from jail for seizures x3-discharged on tapering benzos. 2/9>> frequent falls at home- rhabdomyolysis-incidental COVID-19 infection-admit to [facility].  Significant studies: 2/9>> CT head: No acute intracranial abnormality 2/9>> UDS positive for benzodiazepines, cocaine and tetrahydrocannabinol 2/10>>Chest x-ray: No pneumonia 2/10>> EEG: No seizures  COVID-19 medications: Remdesivir: 2/10>>  Antibiotics: None  Microbiology data: 2/9 >>blood culture: No growth  Procedures: None  Consults: None  DVT prophylaxis: enoxaparin (LOVENOX) injection 40 mg Start: 11/03/20 1000    Subjective:   Feels better-but feels he is somewhat unsteady on his feet-continues to have back pain.  On my evaluation in the room-he was able to stand on his own-gait was not tested   Assessment  & Plan :   Rhabdomyolysis: Due to cocaine use/recent seizures-CK levels downtrending-claims he reduced with dark but not cola colored-repeat CK levels in a.m.    Transaminitis: Likely secondary to rhabdomyolysis-repeat LFTs  tomorrow morning.  Seizures: Occurred on 2/8 when he was in police custody/jail-and did not take his benzodiazepine/l psych meds for 3 days.  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  CT head without any acute abnormalities.  EEG without seizures.  Has been placed back on his usual benzodiazepine regimen.    Chronic pain syndrome: Claims he has chronic back pain-and on Percocet chronically-claims he follows with pain management at [facility].  Has been placed on as needed oxycodone-seems to be tolerating well.  Bipolar disorder: Virginia Crews been resumed on Latuda/Zoloft/Seroquel and lamotrigine.  Plans are to follow with  outpatient psychiatry  BPH: Continue Flomax  HLD: Hold statins-await LFTs.  HTN: BP stable-lisinopril on hold-resume when able  Polysubstance abuse: Acknowledges daily marijuana use-very vague about how frequently he does cocaine-denies regular alcohol use any longer.  Counseled extensively this morning  Incidental COVID-19 infection: Asymptomatic-not hypoxic-continue Remdesivir x3 days.   Lab Results  Component Value Date   [place] (A) 11/02/2020   [place] NEGATIVE 09/07/2020   [place] Not Detected 07/31/2019    Obesity: Estimated body mass index is 31.57 kg/m as calculated from the following:   Height as of 11/20/19: 5' 8.75" (1.746 m).   Weight as of 11/20/19: 96.3 kg.    GI prophylaxis: PPI  ABG:    Component Value Date/Time   HCO3 23.8 11/02/2020 2203   TCO2 25 11/02/2020 2203   O2SAT 95.0 11/02/2020 2203    Vent Settings: N/A    Condition - Stable  Family Communication  :  Spouse-[phone] updated over the phone on 2/11  Code Status :  Full Code  Diet :  Diet Order            Diet regular Room service appropriate? Yes; Fluid consistency: Thin  Diet effective now                  Disposition Plan  :   Status is: Inpatient  Remains inpatient appropriate because:Inpatient level of care appropriate due  to severity of illness   Dispo: The patient is from: Home              Anticipated d/c is to: Home              Anticipated d/c date is: 2 days              Patient currently is not medically stable to d/c.   Difficult to place patient No   Barriers to discharge: Rhabdomyolysis requiring IV fluids-seizure work-up pending.  Antimicorbials  :    Anti-infectives (From admission, onward)   Start     Dose/Rate Route Frequency Ordered Stop   11/04/20 1000  remdesivir 100 mg in sodium chloride 0.9 % 100 mL IVPB       "Followed by" Linked Group Details   100 mg 200 mL/hr over 30 Minutes Intravenous Daily 11/03/20 1548 11/06/20 0959   11/03/20 1630  remdesivir 200 mg in sodium chloride 0.9% 250 mL IVPB       "Followed by" Linked Group Details   200 mg 580 mL/hr over 30 Minutes Intravenous Once 11/03/20 1548 11/03/20 1737      Inpatient Medications  Scheduled Meds: . clonazePAM  2 mg Oral QHS  . enoxaparin (LOVENOX) injection  40 mg Subcutaneous Q24H  . lamoTRIgine  150 mg Oral QHS  . lurasidone  120 mg Oral QPC supper  . nicotine  21 mg Transdermal Daily  . pantoprazole  40 mg Oral Daily  . polyethylene glycol  17 g Oral Daily  . QUEtiapine  450 mg Oral QHS  . sertraline  100 mg Oral Daily  . tamsulosin  0.8 mg Oral QHS  . zolpidem  10 mg Oral QHS   Continuous Infusions: . sodium chloride 150 mL/hr at 11/04/20 0022  . remdesivir 100 mg in NS 100 mL 100 mg (11/04/20 0814)   PRN Meds:.ALPRAZolam, ondansetron **OR** ondansetron (ZOFRAN) IV, oxyCODONE, oxymetazoline, pneumococcal 23 valent vaccine   Time Spent in minutes  25  See all Orders from today for further details   Oren Binet M.D on  11/04/2020 at 1:33 PM  To page go to [email or web address] - use universal password  [facility] -  Office  [phone]    Objective:   Vitals:   11/03/20 1945 11/04/20 0400 11/04/20 0821 11/04/20 1203  BP: 135/87 (!) 123/93 (!) 155/92 128/89  Pulse: 84 94 100 88  Resp: 18  18    Temp: 97.7 F (36.5 C) 97.9 F (36.6 C)    TempSrc: Oral Oral    SpO2: 97% 94%      Wt Readings from Last 3 Encounters:  11/20/19 96.3 kg  04/29/19 102.1 kg  10/24/18 101.6 kg     Intake/Output Summary (Last 24 hours) at 11/04/2020 1333 Last data filed at 11/04/2020 1039 Gross per 24 hour  Intake 2597.38 ml  Output 1300 ml  Net 1297.38 ml     Physical Exam Gen Exam:Alert awake-not in any distress HEENT:atraumatic, normocephalic Chest: B/L clear to auscultation anteriorly CVS:S1S2 regular Abdomen:soft non tender, non distended Extremities:no edema Neurology: Non focal Skin: no rash   Data Review:    CBC Recent Labs  Lab 11/01/20 0128 11/01/20 0731 11/02/20 1751 11/02/20 1816 11/02/20 2203 11/03/20 0256  WBC 23.9*  --  14.1*  --   --  12.0*  HGB 16.7 15.3 15.4 15.3 14.3 13.9  HCT 48.7 45.0 45.0 45.0 42.0 40.3  PLT 370  --  273  --   --  245  MCV 95.3  --  90.2  --   --  91.4  MCH 32.7  --  30.9  --   --  31.5  MCHC 34.3  --  34.2  --   --  34.5  RDW 13.2  --  12.8  --   --  12.8  LYMPHSABS 1.0  --  0.8  --   --   --   MONOABS 1.0  --  1.2*  --   --   --   EOSABS 0.0  --  0.0  --   --   --   BASOSABS 0.1  --  0.0  --   --   --     Chemistries  Recent Labs  Lab 11/01/20 0731 11/02/20 1751 11/02/20 1816 11/02/20 2203 11/03/20 0229 11/03/20 0815 11/04/20 0130  NA 137 136 136 137  --  139 138  K 3.6 3.4* 3.5 3.6  --  3.3* 3.7  CL 104 100 102  --   --  107 106  CO2  --  21*  --   --   --  23 24  GLUCOSE 85 109* 104*  --   --  86 86  BUN 18 16 18   --   --  10 9  CREATININE 0.70 0.89 0.70  --  0.85 0.77 0.76  CALCIUM  --  9.2  --   --   --  8.2* 8.3*  AST  --  239*  --   --   --  168* 164*  ALT  --  76*  --   --   --  59* 62*  ALKPHOS  --  59  --   --   --  44 42  BILITOT  --  1.0  --   --   --  0.6 0.6   ------------------------------------------------------------------------------------------------------------------ No results for  input(s): CHOL, HDL, LDLCALC, TRIG, CHOLHDL, LDLDIRECT in the last 72 hours.  Lab Results  Component Value Date   HGBA1C 5.4 10/24/2018   ------------------------------------------------------------------------------------------------------------------ Recent Labs    11/03/20 0229  TSH 1.101   ------------------------------------------------------------------------------------------------------------------ Recent Labs    11/02/20 2010  VITAMINB12 233    Coagulation profile Recent Labs  Lab 11/02/20 1751  INR 1.1    Recent Labs    11/03/20 0800 11/04/20 0130  DDIMER 0.79* 0.73*    Cardiac Enzymes No results for input(s): CKMB, TROPONINI, MYOGLOBIN in the last 168 hours.  Invalid input(s): CK ------------------------------------------------------------------------------------------------------------------ No results found for: BNP  Micro Results Recent Results (from the past 240 hour(s))  SARS CORONAVIRUS 2 (TAT 6-24 HRS) Nasopharyngeal Nasopharyngeal Swab     Status: Abnormal   Collection Time: 11/02/20  9:17 PM   Specimen: Nasopharyngeal Swab  Result Value Ref Range Status   SARS Coronavirus 2 POSITIVE (A) NEGATIVE Final    Comment: (NOTE) SARS-CoV-2 target nucleic acids are DETECTED.  The SARS-CoV-2 RNA is generally detectable in upper and lower respiratory specimens during the acute phase of infection. Positive results are indicative of the presence of SARS-CoV-2 RNA. Clinical correlation with patient history and other diagnostic information is  necessary to determine patient infection status. Positive results do not rule out bacterial infection or co-infection with other viruses.  The expected result is Negative.  Fact Sheet for Patients: SugarRoll.be  Fact Sheet for Healthcare Providers: https://www.woods-mathews.com/  This test is not yet approved or cleared by the [place] FDA and  has been authorized  for detection and/or diagnosis of SARS-CoV-2 by FDA under an Emergency Use Authorization (EUA). This EUA will remain  in effect (meaning this test can be used) for the duration of the COVID-19 declaration under Section 564(b)(1) of the Act, 21 U. S.C. section 360bbb-3(b)(1), unless the authorization is terminated or revoked sooner.   Performed at [facility], [place] [address], [place], [place] [address]   Blood culture (routine x 2)     Status: None (Preliminary result)   Collection Time: 11/02/20  9:30 PM   Specimen: BLOOD RIGHT HAND  Result Value Ref Range Status   Specimen Description BLOOD RIGHT HAND  Final   Special Requests   Final    BOTTLES DRAWN AEROBIC AND ANAEROBIC Blood Culture adequate volume   Culture   Final    NO GROWTH 2 DAYS Performed at [facility], [place] [address] [place], [place] [address]    Report Status PENDING  Incomplete  Blood culture (routine x 2)     Status: None (Preliminary result)   Collection Time: 11/02/20  9:50 PM   Specimen: BLOOD LEFT HAND  Result Value Ref Range Status   Specimen Description BLOOD LEFT HAND  Final   Special Requests   Final    BOTTLES DRAWN AEROBIC AND ANAEROBIC Blood Culture adequate volume   Culture   Final    NO GROWTH 1 DAY Performed at [facility], [place] [address] [place], [place] [address]    Report Status PENDING  Incomplete    Radiology Reports DG Thoracic Spine 2 View  Result Date: 11/02/2020 CLINICAL DATA:  Upper back pain EXAM: THORACIC SPINE 2 VIEWS COMPARISON:  Cervical spine CT 11/01/2020, thoracic radiographs 05/02/2015 FINDINGS: The osseous structures appear diffusely demineralized which may limit detection of small or nondisplaced fractures. Twelve rib-bearing thoracic levels. Age-indeterminate compression deformities are noted at the T5 and T6 superior endplates which are new from comparison imaging in 2016. Preservation levocurvature of the upper thoracic levels, apex T4. A more gradual  dextrocurvature of the mid to lower thoracic spine, apex at T9-10. no traumatic listhesis. Multilevel diffuse intervertebral  disc height loss with discogenic and facet degenerative changes are again seen. Redemonstration of the C5-C7 cervical spinal fusion better detailed on recent CT cervical spine. Included portions of the chest, mediastinum and upper abdomen are unremarkable. IMPRESSION: 1. Age-indeterminate compression deformities at the T5 and T6 superior endplates which are new from comparison imaging in 2016. Correlate with point tenderness and consider cross-sectional imaging as clinically warranted. 2. No traumatic listhesis. 3. Multilevel degenerative changes of the thoracic spine. 4. The osseous structures appear diffusely demineralized which may limit detection of small or nondisplaced fractures. Electronically Signed   By: Lovena Le M.D.   On: 11/02/2020 22:28   CT HEAD WO CONTRAST  Result Date: 11/02/2020 CLINICAL DATA:  Seizure with subsequent fall. EXAM: CT HEAD WITHOUT CONTRAST TECHNIQUE: Contiguous axial images were obtained from the base of the skull through the vertex without intravenous contrast. COMPARISON:  11/01/2020 FINDINGS: Brain: The brain has normal appearance without evidence of atrophy, old or acute infarction, mass lesion, hemorrhage, hydrocephalus or extra-axial collection. Vascular: No abnormal vascular finding. Skull: No skull fracture. Sinuses/Orbits: Mild mucosal inflammatory changes of the sinuses. Orbits negative. Other: Left forehead soft tissue swelling. IMPRESSION: No intracranial abnormality. No skull fracture. Left forehead soft tissue swelling. Electronically Signed   By: Nelson Chimes M.D.   On: 11/02/2020 18:08   CT Head Wo Contrast  Result Date: 11/01/2020 CLINICAL DATA:  New onset seizure EXAM: CT HEAD WITHOUT CONTRAST CT MAXILLOFACIAL WITHOUT CONTRAST CT CERVICAL SPINE WITHOUT CONTRAST TECHNIQUE: Multidetector CT imaging of the head, cervical spine, and  maxillofacial structures were performed using the standard protocol without intravenous contrast. Multiplanar CT image reconstructions of the cervical spine and maxillofacial structures were also generated. COMPARISON:  None. FINDINGS: CT HEAD FINDINGS Brain: There is no mass, hemorrhage or extra-axial collection. The size and configuration of the ventricles and extra-axial CSF spaces are normal. The brain parenchyma is normal, without evidence of acute or chronic infarction. Vascular: No abnormal hyperdensity of the major intracranial arteries or dural venous sinuses. No intracranial atherosclerosis. Skull: The visualized skull base, calvarium and extracranial soft tissues are normal. CT MAXILLOFACIAL FINDINGS Osseous: --Complex facial fracture types: No LeFort, zygomaticomaxillary complex or nasoorbitoethmoidal fracture. --Simple fracture types: None. --Mandible: No fracture or dislocation. Orbits: The globes are intact. Normal appearance of the intra- and extraconal fat. Symmetric extraocular muscles and optic nerves. Sinuses: No fluid levels or advanced mucosal thickening. Soft tissues: Small left frontal scalp hematoma. CT CERVICAL SPINE FINDINGS Alignment: No static subluxation. Facets are aligned. Occipital condyles and the lateral masses of C1-C2 are aligned. Skull base and vertebrae: C5-7 ACDF. Soft tissues and spinal canal: No prevertebral fluid or swelling. No visible canal hematoma. Disc levels: Multilevel left facet hypertrophy. Upper chest: No pneumothorax, pulmonary nodule or pleural effusion. Other: Normal visualized paraspinal cervical soft tissues. IMPRESSION: 1. No acute intracranial abnormality. 2. Small left frontal scalp hematoma without facial or skull fracture. 3. No acute fracture or static subluxation of the cervical spine. Electronically Signed   By: Ulyses Jarred M.D.   On: 11/01/2020 03:41   CT Cervical Spine Wo Contrast  Result Date: 11/01/2020 CLINICAL DATA:  New onset seizure EXAM:  CT HEAD WITHOUT CONTRAST CT MAXILLOFACIAL WITHOUT CONTRAST CT CERVICAL SPINE WITHOUT CONTRAST TECHNIQUE: Multidetector CT imaging of the head, cervical spine, and maxillofacial structures were performed using the standard protocol without intravenous contrast. Multiplanar CT image reconstructions of the cervical spine and maxillofacial structures were also generated. COMPARISON:  None. FINDINGS: CT HEAD FINDINGS Brain: There  is no mass, hemorrhage or extra-axial collection. The size and configuration of the ventricles and extra-axial CSF spaces are normal. The brain parenchyma is normal, without evidence of acute or chronic infarction. Vascular: No abnormal hyperdensity of the major intracranial arteries or dural venous sinuses. No intracranial atherosclerosis. Skull: The visualized skull base, calvarium and extracranial soft tissues are normal. CT MAXILLOFACIAL FINDINGS Osseous: --Complex facial fracture types: No LeFort, zygomaticomaxillary complex or nasoorbitoethmoidal fracture. --Simple fracture types: None. --Mandible: No fracture or dislocation. Orbits: The globes are intact. Normal appearance of the intra- and extraconal fat. Symmetric extraocular muscles and optic nerves. Sinuses: No fluid levels or advanced mucosal thickening. Soft tissues: Small left frontal scalp hematoma. CT CERVICAL SPINE FINDINGS Alignment: No static subluxation. Facets are aligned. Occipital condyles and the lateral masses of C1-C2 are aligned. Skull base and vertebrae: C5-7 ACDF. Soft tissues and spinal canal: No prevertebral fluid or swelling. No visible canal hematoma. Disc levels: Multilevel left facet hypertrophy. Upper chest: No pneumothorax, pulmonary nodule or pleural effusion. Other: Normal visualized paraspinal cervical soft tissues. IMPRESSION: 1. No acute intracranial abnormality. 2. Small left frontal scalp hematoma without facial or skull fracture. 3. No acute fracture or static subluxation of the cervical spine.  Electronically Signed   By: Ulyses Jarred M.D.   On: 11/01/2020 03:41   DG Chest Port 1V same Day  Result Date: 11/03/2020 CLINICAL DATA:  Shortness of breath with altered mental status. EXAM: PORTABLE CHEST 1 VIEW COMPARISON:  10/07/2014 FINDINGS: 0740 hours. The lungs are clear without focal pneumonia, edema, pneumothorax or pleural effusion. The cardiopericardial silhouette is within normal limits for size. The visualized bony structures of the thorax show no acute abnormality. Telemetry leads overlie the chest. IMPRESSION: No active disease. Electronically Signed   By: Misty Stanley M.D.   On: 11/03/2020 08:17   EEG adult  Result Date: 11/03/2020 Lora Havens, MD     11/03/2020  4:25 PM Patient Name: VIYAN ROSAMOND MRN: [id number] Epilepsy Attending: Lora Havens Referring Physician/Provider: Dr Oren Binet Date: 11/03/2020 Duration: 25.29 mins Patient history: 55yo M with seizure like episode in setting of missing benzos. EEG to evaluate for seizure Level of alertness: Awake AEDs during EEG study: Klonopin Technical aspects: This EEG study was done with scalp electrodes positioned according to the 10-20 International system of electrode placement. Electrical activity was acquired at a sampling rate of 500Hz  and reviewed with a high frequency filter of 70Hz  and a low frequency filter of 1Hz . EEG data were recorded continuously and digitally stored. Description: The posterior dominant rhythm consists of 8 Hz activity of moderate voltage (25-35 uV) seen predominantly in posterior head regions, symmetric and reactive to eye opening and eye closing. There is an excessive amount of 15 to 18 Hz, 2-3 uV beta activity distributed symmetrically and diffusely. Hyperventilation and photic stimulation were not performed.   IMPRESSION: This study is within normal limits. No seizures or epileptiform discharges were seen throughout the recording. Lora Havens   CT Maxillofacial Wo Contrast  Result  Date: 11/01/2020 CLINICAL DATA:  New onset seizure EXAM: CT HEAD WITHOUT CONTRAST CT MAXILLOFACIAL WITHOUT CONTRAST CT CERVICAL SPINE WITHOUT CONTRAST TECHNIQUE: Multidetector CT imaging of the head, cervical spine, and maxillofacial structures were performed using the standard protocol without intravenous contrast. Multiplanar CT image reconstructions of the cervical spine and maxillofacial structures were also generated. COMPARISON:  None. FINDINGS: CT HEAD FINDINGS Brain: There is no mass, hemorrhage or extra-axial collection. The size and configuration of the  ventricles and extra-axial CSF spaces are normal. The brain parenchyma is normal, without evidence of acute or chronic infarction. Vascular: No abnormal hyperdensity of the major intracranial arteries or dural venous sinuses. No intracranial atherosclerosis. Skull: The visualized skull base, calvarium and extracranial soft tissues are normal. CT MAXILLOFACIAL FINDINGS Osseous: --Complex facial fracture types: No LeFort, zygomaticomaxillary complex or nasoorbitoethmoidal fracture. --Simple fracture types: None. --Mandible: No fracture or dislocation. Orbits: The globes are intact. Normal appearance of the intra- and extraconal fat. Symmetric extraocular muscles and optic nerves. Sinuses: No fluid levels or advanced mucosal thickening. Soft tissues: Small left frontal scalp hematoma. CT CERVICAL SPINE FINDINGS Alignment: No static subluxation. Facets are aligned. Occipital condyles and the lateral masses of C1-C2 are aligned. Skull base and vertebrae: C5-7 ACDF. Soft tissues and spinal canal: No prevertebral fluid or swelling. No visible canal hematoma. Disc levels: Multilevel left facet hypertrophy. Upper chest: No pneumothorax, pulmonary nodule or pleural effusion. Other: Normal visualized paraspinal cervical soft tissues. IMPRESSION: 1. No acute intracranial abnormality. 2. Small left frontal scalp hematoma without facial or skull fracture. 3. No acute  fracture or static subluxation of the cervical spine. Electronically Signed   By: Ulyses Jarred M.D.   On: 11/01/2020 03:41

## 2020-11-04 NOTE — Plan of Care (Signed)
Patient is resting in bed. No complaints overnight. VSS. Remains on room air. Patient made a password to give to his wife because she kept calling throughout the night and not remembering that she called [place] earlier. OOB ambulating. CIWA Q4. Call bell within reach. Bed alarm on.  Problem: Education: Goal: Knowledge of risk factors and measures for prevention of condition will improve Outcome: Progressing   Problem: Coping: Goal: Psychosocial and spiritual needs will be supported Outcome: Progressing   Problem: Respiratory: Goal: Will maintain a patent airway Outcome: Progressing Goal: Complications related to the disease process, condition or treatment will be avoided or minimized Outcome: Progressing

## 2020-11-05 DIAGNOSIS — M6282 Rhabdomyolysis: Principal | ICD-10-CM

## 2020-11-05 LAB — COMPREHENSIVE METABOLIC PANEL
ALT: 66 U/L — ABNORMAL HIGH (ref 0–44)
AST: 147 U/L — ABNORMAL HIGH (ref 15–41)
Albumin: 2.9 g/dL — ABNORMAL LOW (ref 3.5–5.0)
Alkaline Phosphatase: 45 U/L (ref 38–126)
Anion gap: 8 (ref 5–15)
BUN: 9 mg/dL (ref 6–20)
CO2: 25 mmol/L (ref 22–32)
Calcium: 8.6 mg/dL — ABNORMAL LOW (ref 8.9–10.3)
Chloride: 106 mmol/L (ref 98–111)
Creatinine, Ser: 0.74 mg/dL (ref 0.61–1.24)
GFR, Estimated: >60 mL/min (ref >60)
Glucose, Bld: 78 mg/dL (ref 70–99)
Potassium: 3.8 mmol/L (ref 3.5–5.1)
Sodium: 139 mmol/L (ref 135–145)
Total Bilirubin: 0.5 mg/dL (ref 0.3–1.2)
Total Protein: 5.2 g/dL — ABNORMAL LOW (ref 6.5–8.1)

## 2020-11-05 LAB — D-DIMER, QUANTITATIVE: D-Dimer, Quant: 0.88 ug/mL-FEU — ABNORMAL HIGH (ref 0.00–0.50)

## 2020-11-05 LAB — CK: Total CK: 5913 U/L — ABNORMAL HIGH (ref 49–397)

## 2020-11-05 LAB — C-REACTIVE PROTEIN: CRP: 3.9 mg/dL — ABNORMAL HIGH (ref <1.0)

## 2020-11-05 NOTE — Discharge Summary (Signed)
PATIENT DETAILS Name: [name] Age: 55 y.o. Sex: male Date of Birth: 1966-02-28 MRN: [id number]. Admitting Physician: Elwyn Reach, MD TMB:[id number], Dalbert Batman, FNP (Inactive)  Admit Date: 11/02/2020 Discharge date: 11/05/2020  Recommendations for Outpatient Follow-up:  1. Follow up with PCP in 1-2 weeks 2. Please obtain CMP/CBC in one week 3. Consider minimizing polypharmacy-slowly tapering down benzodiazepines and narcotics 4. Continue outpatient counseling regarding importance of avoiding cocaine/marijuana use.   Admitted From:  Home  Disposition: [place]: No  Equipment/Devices: None  Discharge Condition: Stable  CODE STATUS: FULL CODE  Diet recommendation:  Diet Order            Diet - low sodium heart healthy           Diet regular Room service appropriate? Yes; Fluid consistency: Thin  Diet effective now                  Brief Summary: Brief Narrative: Patient is a 55 y.o. male with PMHx of bipolar disorder, BPH, HLD, HTN, chronic pain syndrome, cocaine/marijuana use-presenting to the hospital with frequent falls-found to have rhabdomyolysis and incidental COVID infection.  COVID-19 vaccinated status: Vaccinated but not boosted  Significant Events: 2/8>> brought to ED from jail for seizures x3-discharged on tapering benzos. 2/9>> frequent falls at home- rhabdomyolysis-incidental COVID-19 infection-admit to [facility].  Significant studies: 2/9>> CT head: No acute intracranial abnormality 2/9>> UDS positive for benzodiazepines, cocaine and tetrahydrocannabinol 2/10>>Chest x-ray: No pneumonia 2/10>> EEG: No seizures  COVID-19 medications: Remdesivir: 2/10>>  Antibiotics: None  Microbiology data: 2/9 >>blood culture: No growth  Procedures: None  Consults: None  Brief Hospital Course: Rhabdomyolysis: Due to cocaine use/recent seizures-CK levels have significantly down trended-urine is now clear per patient.  Stable  for discharge-as asked him to continue to remain hydrated.  Repeat CK levels at PCPs office.    Transaminitis: Likely secondary to rhabdomyolysis-slowly downtrending-repeat LFTs at PCPs office in 1 week.    Seizures: Occurred on 2/8 (prior to this hospitalization-had ED visit for this problem) when he was in police custody/jail-and did not take his benzodiazepine/l psych meds for 3 days.  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  CT head without any acute abnormalities.  EEG without seizures.  Has been placed back on his usual benzodiazepine regimen.    Counseled regarding importance of continuing his benzodiazepine regimen-he apparently has been on benzos for multiple years-PCP to consider slow taper in the outpatient setting.  Chronic pain syndrome: Claims he has chronic back pain-and on Percocet chronically-claims he follows with pain management at [facility].  Has been placed on as needed oxycodone-seems to be tolerating well.  Primary pain management to consider minimizing polypharmacy-slow taper off narcotics.  Bipolar disorder: [place] Crews been resumed on Latuda/Zoloft/Seroquel and lamotrigine.    Have encouraged patient to follow with his outpatient psychiatrist.  BPH: Continue Flomax  HLD: Hold statins-resume when LFTs decrease further.  HTN: BP stable-during this hospital stay-we will resume lisinopril on discharge.  Polysubstance abuse: Acknowledges daily marijuana use-very vague about how frequently he does cocaine-denies regular alcohol use any longer.    Have counseled extensively-PCP/primary pain management MD to consider minimizing polypharmacy-to slowly taper down narcotics and benzodiazepines.  Have spoken with spouse-she is aware of the life-threatening and life disabling dangers of polypharmacy.  Patient has been on these medications for years-and not possible to do a taper during this short hospital stay.  Incidental COVID-19 infection: Asymptomatic-not  hypoxic-managed with Remdesivir x3 days.  COVID-19 Labs:  Recent Labs    11/03/20 0800 11/04/20 0130 11/05/20 0119  DDIMER 0.79* 0.73* 0.88*  CRP 5.8* 4.9* 3.9*    Lab Results  Component Value Date   SARSCOV2NAA POSITIVE (A) 11/02/2020   [place] NEGATIVE 09/07/2020   [place] Not Detected 07/31/2019     Obesity: Estimated body mass index is 31.57 kg/m as calculated from the following:   Height as of 11/20/19: 5' 8.75" (1.746 m).   Weight as of 11/20/19: 96.3 kg.   Discharge Diagnoses:  Principal Problem:   Rhabdomyolysis Active Problems:   Bipolar disorder ([place])   Essential hypertension   GERD   Back pain   Current nicotine use   Polysubstance abuse [facility])   Discharge Instructions:    Person Under Monitoring Name: [name]  Location: [place] [address]   Infection Prevention Recommendations for Individuals Confirmed to have, or Being Evaluated for, 2019 Novel Coronavirus (COVID-19) Infection Who Receive Care at Home  Individuals who are confirmed to have, or are being evaluated for, COVID-19 should follow the prevention steps below until a healthcare provider or local or state health department says they can return to normal activities.  Stay home except to get medical care You should restrict activities outside your home, except for getting medical care. Do not go to work, school, or public areas, and do not use public transportation or taxis.  Call ahead before visiting your doctor Before your medical appointment, call the healthcare provider and tell them that you have, or are being evaluated for, COVID-19 infection. This will help the healthcare provider's office take steps to keep other people from getting infected. Ask your healthcare provider to call the local or state health department.  Monitor your symptoms Seek prompt medical attention if your illness is worsening (e.g., difficulty breathing). Before going  to your medical appointment, call the healthcare provider and tell them that you have, or are being evaluated for, COVID-19 infection. Ask your healthcare provider to call the local or state health department.  Wear a facemask You should wear a facemask that covers your nose and mouth when you are in the same room with other people and when you visit a healthcare provider. People who live with or visit you should also wear a facemask while they are in the same room with you.  Separate yourself from other people in your home As much as possible, you should stay in a different room from other people in your home. Also, you should use a separate bathroom, if available.  Avoid sharing household items You should not share dishes, drinking glasses, cups, eating utensils, towels, bedding, or other items with other people in your home. After using these items, you should wash them thoroughly with soap and water.  Cover your coughs and sneezes Cover your mouth and nose with a tissue when you cough or sneeze, or you can cough or sneeze into your sleeve. Throw used tissues in a lined trash can, and immediately wash your hands with soap and water for at least 20 seconds or use an alcohol-based hand rub.  Wash your [facility] your hands often and thoroughly with soap and water for at least 20 seconds. You can use an alcohol-based hand sanitizer if soap and water are not available and if your hands are not visibly dirty. Avoid touching your eyes, nose, and mouth with unwashed hands.   Prevention Steps for Caregivers and Household Members of Individuals Confirmed to have, or Being Evaluated for,  COVID-19 Infection Being Cared for in the Home  If you live with, or provide care at home for, a person confirmed to have, or being evaluated for, COVID-19 infection please follow these guidelines to prevent infection:  Follow healthcare provider's instructions Make sure that you understand and can help  the patient follow any healthcare provider instructions for all care.  Provide for the patient's basic needs You should help the patient with basic needs in the home and provide support for getting groceries, prescriptions, and other personal needs.  Monitor the patient's symptoms If they are getting sicker, call his or her medical provider and tell them that the patient has, or is being evaluated for, COVID-19 infection. This will help the healthcare provider's office take steps to keep other people from getting infected. Ask the healthcare provider to call the local or state health department.  Limit the number of people who have contact with the patient  If possible, have only one caregiver for the patient.  Other household members should stay in another home or place of residence. If this is not possible, they should stay  in another room, or be separated from the patient as much as possible. Use a separate bathroom, if available.  Restrict visitors who do not have an essential need to be in the home.  Keep older adults, very young children, and other sick people away from the patient Keep older adults, very young children, and those who have compromised immune systems or chronic health conditions away from the patient. This includes people with chronic heart, lung, or kidney conditions, diabetes, and cancer.  Ensure good ventilation Make sure that shared spaces in the home have good air flow, such as from an air conditioner or an opened window, weather permitting.  Wash your hands often  Wash your hands often and thoroughly with soap and water for at least 20 seconds. You can use an alcohol based hand sanitizer if soap and water are not available and if your hands are not visibly dirty.  Avoid touching your eyes, nose, and mouth with unwashed hands.  Use disposable paper towels to dry your hands. If not available, use dedicated cloth towels and replace them when they become  wet.  Wear a facemask and gloves  Wear a disposable facemask at all times in the room and gloves when you touch or have contact with the patient's blood, body fluids, and/or secretions or excretions, such as sweat, saliva, sputum, nasal mucus, vomit, urine, or feces.  Ensure the mask fits over your nose and mouth tightly, and do not touch it during use.  Throw out disposable facemasks and gloves after using them. Do not reuse.  Wash your hands immediately after removing your facemask and gloves.  If your personal clothing becomes contaminated, carefully remove clothing and launder. Wash your hands after handling contaminated clothing.  Place all used disposable facemasks, gloves, and other waste in a lined container before disposing them with other household waste.  Remove gloves and wash your hands immediately after handling these items.  Do not share dishes, glasses, or other household items with the patient  Avoid sharing household items. You should not share dishes, drinking glasses, cups, eating utensils, towels, bedding, or other items with a patient who is confirmed to have, or being evaluated for, COVID-19 infection.  After the person uses these items, you should wash them thoroughly with soap and water.  Wash laundry thoroughly  Immediately remove and wash clothes or bedding that have blood,  body fluids, and/or secretions or excretions, such as sweat, saliva, sputum, nasal mucus, vomit, urine, or feces, on them.  Wear gloves when handling laundry from the patient.  Read and follow directions on labels of laundry or clothing items and detergent. In general, wash and dry with the warmest temperatures recommended on the label.  Clean all areas the individual has used often  Clean all touchable surfaces, such as counters, tabletops, doorknobs, bathroom fixtures, toilets, phones, keyboards, tablets, and bedside tables, every day. Also, clean any surfaces that may have blood, body  fluids, and/or secretions or excretions on them.  Wear gloves when cleaning surfaces the patient has come in contact with.  Use a diluted bleach solution (e.g., dilute bleach with 1 part bleach and 10 parts water) or a household disinfectant with a label that says EPA-registered for coronaviruses. To make a bleach solution at home, add 1 tablespoon of bleach to 1 quart (4 cups) of water. For a larger supply, add  cup of bleach to 1 gallon (16 cups) of water.  Read labels of cleaning products and follow recommendations provided on product labels. Labels contain instructions for safe and effective use of the cleaning product including precautions you should take when applying the product, such as wearing gloves or eye protection and making sure you have good ventilation during use of the product.  Remove gloves and wash hands immediately after cleaning.  Monitor yourself for signs and symptoms of illness Caregivers and household members are considered close contacts, should monitor their health, and will be asked to limit movement outside of the home to the extent possible. Follow the monitoring steps for close contacts listed on the symptom monitoring form.   ? If you have additional questions, contact your local health department or call the epidemiologist on call at [phone] (available 24/7). ? This guidance is subject to change. For the most up-to-date guidance from CDC, please refer to their website: YouBlogs.pl    Activity:  As tolerated   Discharge Instructions    Call MD for:  difficulty breathing, headache or visual disturbances   Complete by: As directed    Diet - low sodium heart healthy   Complete by: As directed    Discharge instructions   Complete by: As directed    1 week of isolation from 2/9  If you develop shortness of breath-please seek immediate medical attention  Stop cocaine/marijuana use.  You  are on significant doses of benzodiazepines/narcotics-you are at risk of polypharmacy-please ask your primary care practitioner/pain management MD to slowly minimize these medications.   Increase activity slowly   Complete by: As directed      Allergies as of 11/05/2020   No Known Allergies     Medication List    STOP taking these medications   terbinafine 250 MG tablet Commonly known as: LAMISIL     TAKE these medications   albuterol 108 (90 Base) MCG/ACT inhaler Commonly known as: ProAir HFA Inhale 1-2 puffs into the lungs every 6 (six) hours as needed for wheezing or shortness of breath.   ALPRAZolam 1 MG tablet Commonly known as: XANAX Take 1 mg by mouth 3 (three) times daily as needed for anxiety.   B-D 3CC LUER-LOK SYR 22GX1-1/2 22G X 1-1/2" 3 ML Misc Generic drug: SYRINGE-NEEDLE (DISP) 3 ML   clonazePAM 2 MG tablet Commonly known as: KLONOPIN Take 2 mg by mouth at bedtime.   diphenhydrAMINE 25 MG tablet Commonly known as: BENADRYL Take 50 mg by mouth at  bedtime as needed for sleep or allergies.   fluticasone 50 MCG/ACT nasal spray Commonly known as: FLONASE Place 2 sprays into both nostrils daily.   lamoTRIgine 150 MG tablet Commonly known as: LAMICTAL Take 150 mg by mouth at bedtime.   lansoprazole 30 MG capsule Commonly known as: PREVACID TAKE 1 CAPSULE BY MOUTH 30 MINUTES BEFORE BREAKFAST AND DINNER DAILY FOR 4 WEEKS, THEN TAKE ONCE A DAY BEFORE DINNER.   Latuda 120 MG Tabs Generic drug: Lurasidone HCl Take 120 mg by mouth daily after supper.   lisinopril 20 MG tablet Commonly known as: ZESTRIL Take 1 tablet (20 mg total) by mouth daily.   methocarbamol 500 MG tablet Commonly known as: Robaxin Take 1 tablet (500 mg total) by mouth 3 (three) times daily as needed for muscle spasms. What changed: when to take this   Narcan 4 MG/0.1ML Liqd nasal spray kit Generic drug: naloxone Place 4 mg into the nose once. As directed   oxyCODONE-acetaminophen  10-325 MG tablet Commonly known as: Percocet Take 1 tablet by mouth every 4 (four) hours as needed for pain. What changed: when to take this   pantoprazole 40 MG tablet Commonly known as: PROTONIX Take 1 tablet (40 mg total) by mouth daily.   QUEtiapine 300 MG tablet Commonly known as: SEROQUEL Take 450 mg by mouth at bedtime. Taking 1 & 1/2 tabs = 450 mg   sertraline 100 MG tablet Commonly known as: ZOLOFT Take 100 mg by mouth daily.   sildenafil 20 MG tablet Commonly known as: REVATIO Take 2-5 tablets (40-100 mg total) by mouth daily as needed (for erectile dysfunction.).   simvastatin 40 MG tablet Commonly known as: ZOCOR TAKE 1 TABLET BY MOUTH DAILY AT 6 PM. What changed: See the new instructions.   tamsulosin 0.4 MG Caps capsule Commonly known as: FLOMAX Take 0.4 mg by mouth at bedtime.   testosterone cypionate 100 MG/ML injection Commonly known as: DEPOTESTOTERONE CYPIONATE Inject 100 mg into the muscle once a week.   zolpidem 10 MG tablet Commonly known as: AMBIEN Take 10 mg by mouth at bedtime.       Follow-up Information    Elby Beck, FNP. Schedule an appointment as soon as possible for a visit in 1 week(s).   Specialties: Nurse Practitioner, Family Medicine Contact information: [place] [address] [phone]              No Known Allergies   Other Procedures/Studies: DG Thoracic Spine 2 View  Result Date: 11/02/2020 CLINICAL DATA:  Upper back pain EXAM: THORACIC SPINE 2 VIEWS COMPARISON:  Cervical spine CT 11/01/2020, thoracic radiographs 05/02/2015 FINDINGS: The osseous structures appear diffusely demineralized which may limit detection of small or nondisplaced fractures. Twelve rib-bearing thoracic levels. Age-indeterminate compression deformities are noted at the T5 and T6 superior endplates which are new from comparison imaging in 2016. Preservation levocurvature of the upper thoracic levels, apex T4. A more  gradual dextrocurvature of the mid to lower thoracic spine, apex at T9-10. no traumatic listhesis. Multilevel diffuse intervertebral disc height loss with discogenic and facet degenerative changes are again seen. Redemonstration of the C5-C7 cervical spinal fusion better detailed on recent CT cervical spine. Included portions of the chest, mediastinum and upper abdomen are unremarkable. IMPRESSION: 1. Age-indeterminate compression deformities at the T5 and T6 superior endplates which are new from comparison imaging in 2016. Correlate with point tenderness and consider cross-sectional imaging as clinically warranted. 2. No traumatic listhesis. 3. Multilevel degenerative  changes of the thoracic spine. 4. The osseous structures appear diffusely demineralized which may limit detection of small or nondisplaced fractures. Electronically Signed   By: Lovena Le M.D.   On: 11/02/2020 22:28   CT HEAD WO CONTRAST  Result Date: 11/02/2020 CLINICAL DATA:  Seizure with subsequent fall. EXAM: CT HEAD WITHOUT CONTRAST TECHNIQUE: Contiguous axial images were obtained from the base of the skull through the vertex without intravenous contrast. COMPARISON:  11/01/2020 FINDINGS: Brain: The brain has normal appearance without evidence of atrophy, old or acute infarction, mass lesion, hemorrhage, hydrocephalus or extra-axial collection. Vascular: No abnormal vascular finding. Skull: No skull fracture. Sinuses/Orbits: Mild mucosal inflammatory changes of the sinuses. Orbits negative. Other: Left forehead soft tissue swelling. IMPRESSION: No intracranial abnormality. No skull fracture. Left forehead soft tissue swelling. Electronically Signed   By: Nelson Chimes M.D.   On: 11/02/2020 18:08   CT Head Wo Contrast  Result Date: 11/01/2020 CLINICAL DATA:  New onset seizure EXAM: CT HEAD WITHOUT CONTRAST CT MAXILLOFACIAL WITHOUT CONTRAST CT CERVICAL SPINE WITHOUT CONTRAST TECHNIQUE: Multidetector CT imaging of the head, cervical spine,  and maxillofacial structures were performed using the standard protocol without intravenous contrast. Multiplanar CT image reconstructions of the cervical spine and maxillofacial structures were also generated. COMPARISON:  None. FINDINGS: CT HEAD FINDINGS Brain: There is no mass, hemorrhage or extra-axial collection. The size and configuration of the ventricles and extra-axial CSF spaces are normal. The brain parenchyma is normal, without evidence of acute or chronic infarction. Vascular: No abnormal hyperdensity of the major intracranial arteries or dural venous sinuses. No intracranial atherosclerosis. Skull: The visualized skull base, calvarium and extracranial soft tissues are normal. CT MAXILLOFACIAL FINDINGS Osseous: --Complex facial fracture types: No LeFort, zygomaticomaxillary complex or nasoorbitoethmoidal fracture. --Simple fracture types: None. --Mandible: No fracture or dislocation. Orbits: The globes are intact. Normal appearance of the intra- and extraconal fat. Symmetric extraocular muscles and optic nerves. Sinuses: No fluid levels or advanced mucosal thickening. Soft tissues: Small left frontal scalp hematoma. CT CERVICAL SPINE FINDINGS Alignment: No static subluxation. Facets are aligned. Occipital condyles and the lateral masses of C1-C2 are aligned. Skull base and vertebrae: C5-7 ACDF. Soft tissues and spinal canal: No prevertebral fluid or swelling. No visible canal hematoma. Disc levels: Multilevel left facet hypertrophy. Upper chest: No pneumothorax, pulmonary nodule or pleural effusion. Other: Normal visualized paraspinal cervical soft tissues. IMPRESSION: 1. No acute intracranial abnormality. 2. Small left frontal scalp hematoma without facial or skull fracture. 3. No acute fracture or static subluxation of the cervical spine. Electronically Signed   By: Ulyses Jarred M.D.   On: 11/01/2020 03:41   CT Cervical Spine Wo Contrast  Result Date: 11/01/2020 CLINICAL DATA:  New onset seizure  EXAM: CT HEAD WITHOUT CONTRAST CT MAXILLOFACIAL WITHOUT CONTRAST CT CERVICAL SPINE WITHOUT CONTRAST TECHNIQUE: Multidetector CT imaging of the head, cervical spine, and maxillofacial structures were performed using the standard protocol without intravenous contrast. Multiplanar CT image reconstructions of the cervical spine and maxillofacial structures were also generated. COMPARISON:  None. FINDINGS: CT HEAD FINDINGS Brain: There is no mass, hemorrhage or extra-axial collection. The size and configuration of the ventricles and extra-axial CSF spaces are normal. The brain parenchyma is normal, without evidence of acute or chronic infarction. Vascular: No abnormal hyperdensity of the major intracranial arteries or dural venous sinuses. No intracranial atherosclerosis. Skull: The visualized skull base, calvarium and extracranial soft tissues are normal. CT MAXILLOFACIAL FINDINGS Osseous: --Complex facial fracture types: No LeFort, zygomaticomaxillary complex or nasoorbitoethmoidal fracture. --  Simple fracture types: None. --Mandible: No fracture or dislocation. Orbits: The globes are intact. Normal appearance of the intra- and extraconal fat. Symmetric extraocular muscles and optic nerves. Sinuses: No fluid levels or advanced mucosal thickening. Soft tissues: Small left frontal scalp hematoma. CT CERVICAL SPINE FINDINGS Alignment: No static subluxation. Facets are aligned. Occipital condyles and the lateral masses of C1-C2 are aligned. Skull base and vertebrae: C5-7 ACDF. Soft tissues and spinal canal: No prevertebral fluid or swelling. No visible canal hematoma. Disc levels: Multilevel left facet hypertrophy. Upper chest: No pneumothorax, pulmonary nodule or pleural effusion. Other: Normal visualized paraspinal cervical soft tissues. IMPRESSION: 1. No acute intracranial abnormality. 2. Small left frontal scalp hematoma without facial or skull fracture. 3. No acute fracture or static subluxation of the cervical spine.  Electronically Signed   By: Ulyses Jarred M.D.   On: 11/01/2020 03:41   DG Chest Port 1V same Day  Result Date: 11/03/2020 CLINICAL DATA:  Shortness of breath with altered mental status. EXAM: PORTABLE CHEST 1 VIEW COMPARISON:  10/07/2014 FINDINGS: 0740 hours. The lungs are clear without focal pneumonia, edema, pneumothorax or pleural effusion. The cardiopericardial silhouette is within normal limits for size. The visualized bony structures of the thorax show no acute abnormality. Telemetry leads overlie the chest. IMPRESSION: No active disease. Electronically Signed   By: Misty Stanley M.D.   On: 11/03/2020 08:17   EEG adult  Result Date: 11/03/2020 Lora Havens, MD     11/03/2020  4:25 PM Patient Name: RAYNARD MAPPS MRN: [id number] Epilepsy Attending: Lora Havens Referring Physician/Provider: Dr Oren Binet Date: 11/03/2020 Duration: 25.29 mins Patient history: 55yo M with seizure like episode in setting of missing benzos. EEG to evaluate for seizure Level of alertness: Awake AEDs during EEG study: Klonopin Technical aspects: This EEG study was done with scalp electrodes positioned according to the 10-20 International system of electrode placement. Electrical activity was acquired at a sampling rate of 500Hz  and reviewed with a high frequency filter of 70Hz  and a low frequency filter of 1Hz . EEG data were recorded continuously and digitally stored. Description: The posterior dominant rhythm consists of 8 Hz activity of moderate voltage (25-35 uV) seen predominantly in posterior head regions, symmetric and reactive to eye opening and eye closing. There is an excessive amount of 15 to 18 Hz, 2-3 uV beta activity distributed symmetrically and diffusely. Hyperventilation and photic stimulation were not performed.   IMPRESSION: This study is within normal limits. No seizures or epileptiform discharges were seen throughout the recording. Lora Havens   CT Maxillofacial Wo Contrast  Result  Date: 11/01/2020 CLINICAL DATA:  New onset seizure EXAM: CT HEAD WITHOUT CONTRAST CT MAXILLOFACIAL WITHOUT CONTRAST CT CERVICAL SPINE WITHOUT CONTRAST TECHNIQUE: Multidetector CT imaging of the head, cervical spine, and maxillofacial structures were performed using the standard protocol without intravenous contrast. Multiplanar CT image reconstructions of the cervical spine and maxillofacial structures were also generated. COMPARISON:  None. FINDINGS: CT HEAD FINDINGS Brain: There is no mass, hemorrhage or extra-axial collection. The size and configuration of the ventricles and extra-axial CSF spaces are normal. The brain parenchyma is normal, without evidence of acute or chronic infarction. Vascular: No abnormal hyperdensity of the major intracranial arteries or dural venous sinuses. No intracranial atherosclerosis. Skull: The visualized skull base, calvarium and extracranial soft tissues are normal. CT MAXILLOFACIAL FINDINGS Osseous: --Complex facial fracture types: No LeFort, zygomaticomaxillary complex or nasoorbitoethmoidal fracture. --Simple fracture types: None. --Mandible: No fracture or dislocation. Orbits: The globes are  intact. Normal appearance of the intra- and extraconal fat. Symmetric extraocular muscles and optic nerves. Sinuses: No fluid levels or advanced mucosal thickening. Soft tissues: Small left frontal scalp hematoma. CT CERVICAL SPINE FINDINGS Alignment: No static subluxation. Facets are aligned. Occipital condyles and the lateral masses of C1-C2 are aligned. Skull base and vertebrae: C5-7 ACDF. Soft tissues and spinal canal: No prevertebral fluid or swelling. No visible canal hematoma. Disc levels: Multilevel left facet hypertrophy. Upper chest: No pneumothorax, pulmonary nodule or pleural effusion. Other: Normal visualized paraspinal cervical soft tissues. IMPRESSION: 1. No acute intracranial abnormality. 2. Small left frontal scalp hematoma without facial or skull fracture. 3. No acute  fracture or static subluxation of the cervical spine. Electronically Signed   By: Ulyses Jarred M.D.   On: 11/01/2020 03:41     TODAY-DAY OF DISCHARGE:  Subjective:   Lurline Del today has no headache,no chest abdominal pain,no new weakness tingling or numbness, feels much better wants to go home today.  Objective:   Blood pressure 120/76, pulse 80, temperature 98.3 F (36.8 C), temperature source Oral, resp. rate 18, SpO2 95 %.  Intake/Output Summary (Last 24 hours) at 11/05/2020 1032 Last data filed at 11/05/2020 0824 Gross per 24 hour  Intake 3802.76 ml  Output 3125 ml  Net 677.76 ml   There were no vitals filed for this visit.  Exam: Awake Alert, Oriented *3, No new F.N deficits, Normal affect [place].AT,PERRAL Supple Neck,No JVD, No cervical lymphadenopathy appriciated.  Symmetrical Chest wall movement, Good air movement bilaterally, CTAB RRR,No Gallops,Rubs or new Murmurs, No Parasternal Heave +ve B.Sounds, Abd Soft, Non tender, No organomegaly appriciated, No rebound -guarding or rigidity. No Cyanosis, Clubbing or edema, No new Rash or bruise   PERTINENT RADIOLOGIC STUDIES: DG Thoracic Spine 2 View  Result Date: 11/02/2020 CLINICAL DATA:  Upper back pain EXAM: THORACIC SPINE 2 VIEWS COMPARISON:  Cervical spine CT 11/01/2020, thoracic radiographs 05/02/2015 FINDINGS: The osseous structures appear diffusely demineralized which may limit detection of small or nondisplaced fractures. Twelve rib-bearing thoracic levels. Age-indeterminate compression deformities are noted at the T5 and T6 superior endplates which are new from comparison imaging in 2016. Preservation levocurvature of the upper thoracic levels, apex T4. A more gradual dextrocurvature of the mid to lower thoracic spine, apex at T9-10. no traumatic listhesis. Multilevel diffuse intervertebral disc height loss with discogenic and facet degenerative changes are again seen. Redemonstration of the C5-C7 cervical spinal fusion  better detailed on recent CT cervical spine. Included portions of the chest, mediastinum and upper abdomen are unremarkable. IMPRESSION: 1. Age-indeterminate compression deformities at the T5 and T6 superior endplates which are new from comparison imaging in 2016. Correlate with point tenderness and consider cross-sectional imaging as clinically warranted. 2. No traumatic listhesis. 3. Multilevel degenerative changes of the thoracic spine. 4. The osseous structures appear diffusely demineralized which may limit detection of small or nondisplaced fractures. Electronically Signed   By: Lovena Le M.D.   On: 11/02/2020 22:28   CT HEAD WO CONTRAST  Result Date: 11/02/2020 CLINICAL DATA:  Seizure with subsequent fall. EXAM: CT HEAD WITHOUT CONTRAST TECHNIQUE: Contiguous axial images were obtained from the base of the skull through the vertex without intravenous contrast. COMPARISON:  11/01/2020 FINDINGS: Brain: The brain has normal appearance without evidence of atrophy, old or acute infarction, mass lesion, hemorrhage, hydrocephalus or extra-axial collection. Vascular: No abnormal vascular finding. Skull: No skull fracture. Sinuses/Orbits: Mild mucosal inflammatory changes of the sinuses. Orbits negative. Other: Left forehead soft tissue swelling. IMPRESSION:  No intracranial abnormality. No skull fracture. Left forehead soft tissue swelling. Electronically Signed   By: Nelson Chimes M.D.   On: 11/02/2020 18:08   CT Head Wo Contrast  Result Date: 11/01/2020 CLINICAL DATA:  New onset seizure EXAM: CT HEAD WITHOUT CONTRAST CT MAXILLOFACIAL WITHOUT CONTRAST CT CERVICAL SPINE WITHOUT CONTRAST TECHNIQUE: Multidetector CT imaging of the head, cervical spine, and maxillofacial structures were performed using the standard protocol without intravenous contrast. Multiplanar CT image reconstructions of the cervical spine and maxillofacial structures were also generated. COMPARISON:  None. FINDINGS: CT HEAD FINDINGS Brain:  There is no mass, hemorrhage or extra-axial collection. The size and configuration of the ventricles and extra-axial CSF spaces are normal. The brain parenchyma is normal, without evidence of acute or chronic infarction. Vascular: No abnormal hyperdensity of the major intracranial arteries or dural venous sinuses. No intracranial atherosclerosis. Skull: The visualized skull base, calvarium and extracranial soft tissues are normal. CT MAXILLOFACIAL FINDINGS Osseous: --Complex facial fracture types: No LeFort, zygomaticomaxillary complex or nasoorbitoethmoidal fracture. --Simple fracture types: None. --Mandible: No fracture or dislocation. Orbits: The globes are intact. Normal appearance of the intra- and extraconal fat. Symmetric extraocular muscles and optic nerves. Sinuses: No fluid levels or advanced mucosal thickening. Soft tissues: Small left frontal scalp hematoma. CT CERVICAL SPINE FINDINGS Alignment: No static subluxation. Facets are aligned. Occipital condyles and the lateral masses of C1-C2 are aligned. Skull base and vertebrae: C5-7 ACDF. Soft tissues and spinal canal: No prevertebral fluid or swelling. No visible canal hematoma. Disc levels: Multilevel left facet hypertrophy. Upper chest: No pneumothorax, pulmonary nodule or pleural effusion. Other: Normal visualized paraspinal cervical soft tissues. IMPRESSION: 1. No acute intracranial abnormality. 2. Small left frontal scalp hematoma without facial or skull fracture. 3. No acute fracture or static subluxation of the cervical spine. Electronically Signed   By: Ulyses Jarred M.D.   On: 11/01/2020 03:41   CT Cervical Spine Wo Contrast  Result Date: 11/01/2020 CLINICAL DATA:  New onset seizure EXAM: CT HEAD WITHOUT CONTRAST CT MAXILLOFACIAL WITHOUT CONTRAST CT CERVICAL SPINE WITHOUT CONTRAST TECHNIQUE: Multidetector CT imaging of the head, cervical spine, and maxillofacial structures were performed using the standard protocol without intravenous  contrast. Multiplanar CT image reconstructions of the cervical spine and maxillofacial structures were also generated. COMPARISON:  None. FINDINGS: CT HEAD FINDINGS Brain: There is no mass, hemorrhage or extra-axial collection. The size and configuration of the ventricles and extra-axial CSF spaces are normal. The brain parenchyma is normal, without evidence of acute or chronic infarction. Vascular: No abnormal hyperdensity of the major intracranial arteries or dural venous sinuses. No intracranial atherosclerosis. Skull: The visualized skull base, calvarium and extracranial soft tissues are normal. CT MAXILLOFACIAL FINDINGS Osseous: --Complex facial fracture types: No LeFort, zygomaticomaxillary complex or nasoorbitoethmoidal fracture. --Simple fracture types: None. --Mandible: No fracture or dislocation. Orbits: The globes are intact. Normal appearance of the intra- and extraconal fat. Symmetric extraocular muscles and optic nerves. Sinuses: No fluid levels or advanced mucosal thickening. Soft tissues: Small left frontal scalp hematoma. CT CERVICAL SPINE FINDINGS Alignment: No static subluxation. Facets are aligned. Occipital condyles and the lateral masses of C1-C2 are aligned. Skull base and vertebrae: C5-7 ACDF. Soft tissues and spinal canal: No prevertebral fluid or swelling. No visible canal hematoma. Disc levels: Multilevel left facet hypertrophy. Upper chest: No pneumothorax, pulmonary nodule or pleural effusion. Other: Normal visualized paraspinal cervical soft tissues. IMPRESSION: 1. No acute intracranial abnormality. 2. Small left frontal scalp hematoma without facial or skull fracture. 3. No acute  fracture or static subluxation of the cervical spine. Electronically Signed   By: Ulyses Jarred M.D.   On: 11/01/2020 03:41   DG Chest Port 1V same Day  Result Date: 11/03/2020 CLINICAL DATA:  Shortness of breath with altered mental status. EXAM: PORTABLE CHEST 1 VIEW COMPARISON:  10/07/2014 FINDINGS: 0740  hours. The lungs are clear without focal pneumonia, edema, pneumothorax or pleural effusion. The cardiopericardial silhouette is within normal limits for size. The visualized bony structures of the thorax show no acute abnormality. Telemetry leads overlie the chest. IMPRESSION: No active disease. Electronically Signed   By: Misty Stanley M.D.   On: 11/03/2020 08:17   EEG adult  Result Date: 11/03/2020 Lora Havens, MD     11/03/2020  4:25 PM Patient Name: DESHANNON HINCHLIFFE MRN: [id number] Epilepsy Attending: Lora Havens Referring Physician/Provider: Dr Oren Binet Date: 11/03/2020 Duration: 25.29 mins Patient history: 55yo M with seizure like episode in setting of missing benzos. EEG to evaluate for seizure Level of alertness: Awake AEDs during EEG study: Klonopin Technical aspects: This EEG study was done with scalp electrodes positioned according to the 10-20 International system of electrode placement. Electrical activity was acquired at a sampling rate of 500Hz  and reviewed with a high frequency filter of 70Hz  and a low frequency filter of 1Hz . EEG data were recorded continuously and digitally stored. Description: The posterior dominant rhythm consists of 8 Hz activity of moderate voltage (25-35 uV) seen predominantly in posterior head regions, symmetric and reactive to eye opening and eye closing. There is an excessive amount of 15 to 18 Hz, 2-3 uV beta activity distributed symmetrically and diffusely. Hyperventilation and photic stimulation were not performed.   IMPRESSION: This study is within normal limits. No seizures or epileptiform discharges were seen throughout the recording. Lora Havens   CT Maxillofacial Wo Contrast  Result Date: 11/01/2020 CLINICAL DATA:  New onset seizure EXAM: CT HEAD WITHOUT CONTRAST CT MAXILLOFACIAL WITHOUT CONTRAST CT CERVICAL SPINE WITHOUT CONTRAST TECHNIQUE: Multidetector CT imaging of the head, cervical spine, and maxillofacial structures were performed  using the standard protocol without intravenous contrast. Multiplanar CT image reconstructions of the cervical spine and maxillofacial structures were also generated. COMPARISON:  None. FINDINGS: CT HEAD FINDINGS Brain: There is no mass, hemorrhage or extra-axial collection. The size and configuration of the ventricles and extra-axial CSF spaces are normal. The brain parenchyma is normal, without evidence of acute or chronic infarction. Vascular: No abnormal hyperdensity of the major intracranial arteries or dural venous sinuses. No intracranial atherosclerosis. Skull: The visualized skull base, calvarium and extracranial soft tissues are normal. CT MAXILLOFACIAL FINDINGS Osseous: --Complex facial fracture types: No LeFort, zygomaticomaxillary complex or nasoorbitoethmoidal fracture. --Simple fracture types: None. --Mandible: No fracture or dislocation. Orbits: The globes are intact. Normal appearance of the intra- and extraconal fat. Symmetric extraocular muscles and optic nerves. Sinuses: No fluid levels or advanced mucosal thickening. Soft tissues: Small left frontal scalp hematoma. CT CERVICAL SPINE FINDINGS Alignment: No static subluxation. Facets are aligned. Occipital condyles and the lateral masses of C1-C2 are aligned. Skull base and vertebrae: C5-7 ACDF. Soft tissues and spinal canal: No prevertebral fluid or swelling. No visible canal hematoma. Disc levels: Multilevel left facet hypertrophy. Upper chest: No pneumothorax, pulmonary nodule or pleural effusion. Other: Normal visualized paraspinal cervical soft tissues. IMPRESSION: 1. No acute intracranial abnormality. 2. Small left frontal scalp hematoma without facial or skull fracture. 3. No acute fracture or static subluxation of the cervical spine. Electronically Signed   By:  Ulyses Jarred M.D.   On: 11/01/2020 03:41     PERTINENT LAB RESULTS: CBC: Recent Labs    11/02/20 1751 11/02/20 1816 11/02/20 2203 11/03/20 0256  WBC 14.1*  --   --   12.0*  HGB 15.4   < > 14.3 13.9  HCT 45.0   < > 42.0 40.3  PLT 273  --   --  245   < > = values in this interval not displayed.   CMET CMP     Component Value Date/Time   NA 139 11/05/2020 0119   K 3.8 11/05/2020 0119   CL 106 11/05/2020 0119   CO2 25 11/05/2020 0119   GLUCOSE 78 11/05/2020 0119   BUN 9 11/05/2020 0119   CREATININE 0.74 11/05/2020 0119   CALCIUM 8.6 (L) 11/05/2020 0119   PROT 5.2 (L) 11/05/2020 0119   ALBUMIN 2.9 (L) 11/05/2020 0119   AST 147 (H) 11/05/2020 0119   ALT 66 (H) 11/05/2020 0119   ALKPHOS 45 11/05/2020 0119   BILITOT 0.5 11/05/2020 0119   GFRNONAA >60 11/05/2020 0119   GFRAA >60 08/09/2015 0908    GFR CrCl cannot be calculated (Unknown ideal weight.). No results for input(s): LIPASE, AMYLASE in the last 72 hours. Recent Labs    11/03/20 0323 11/04/20 0130 11/05/20 0119  CKTOTAL 13,253* 10,624* 5,913*   Invalid input(s): POCBNP Recent Labs    11/04/20 0130 11/05/20 0119  DDIMER 0.73* 0.88*   No results for input(s): HGBA1C in the last 72 hours. No results for input(s): CHOL, HDL, LDLCALC, TRIG, CHOLHDL, LDLDIRECT in the last 72 hours. Recent Labs    11/03/20 0229  TSH 1.101   Recent Labs    11/02/20 2010  VITAMINB12 233   Coags: Recent Labs    11/02/20 1751  INR 1.1   Microbiology: Recent Results (from the past 240 hour(s))  SARS CORONAVIRUS 2 (TAT 6-24 HRS) Nasopharyngeal Nasopharyngeal Swab     Status: Abnormal   Collection Time: 11/02/20  9:17 PM   Specimen: Nasopharyngeal Swab  Result Value Ref Range Status   SARS Coronavirus 2 POSITIVE (A) NEGATIVE Final    Comment: (NOTE) SARS-CoV-2 target nucleic acids are DETECTED.  The SARS-CoV-2 RNA is generally detectable in upper and lower respiratory specimens during the acute phase of infection. Positive results are indicative of the presence of SARS-CoV-2 RNA. Clinical correlation with patient history and other diagnostic information is  necessary to determine  patient infection status. Positive results do not rule out bacterial infection or co-infection with other viruses.  The expected result is Negative.  Fact Sheet for Patients: SugarRoll.be  Fact Sheet for Healthcare Providers: https://www.woods-mathews.com/  This test is not yet approved or cleared by the [place] FDA and  has been authorized for detection and/or diagnosis of SARS-CoV-2 by FDA under an Emergency Use Authorization (EUA). This EUA will remain  in effect (meaning this test can be used) for the duration of the COVID-19 declaration under Section 564(b)(1) of the Act, 21 U. S.C. section 360bbb-3(b)(1), unless the authorization is terminated or revoked sooner.   Performed at [facility], [place] [address], [place], [place] [address]   Blood culture (routine x 2)     Status: None (Preliminary result)   Collection Time: 11/02/20  9:30 PM   Specimen: BLOOD RIGHT HAND  Result Value Ref Range Status   Specimen Description BLOOD RIGHT HAND  Final   Special Requests   Final    BOTTLES DRAWN AEROBIC AND ANAEROBIC  Blood Culture adequate volume   Culture   Final    NO GROWTH 2 DAYS Performed at [facility], [place] [address] [place], [place] [address]    Report Status PENDING  Incomplete  Blood culture (routine x 2)     Status: None (Preliminary result)   Collection Time: 11/02/20  9:50 PM   Specimen: BLOOD LEFT HAND  Result Value Ref Range Status   Specimen Description BLOOD LEFT HAND  Final   Special Requests   Final    BOTTLES DRAWN AEROBIC AND ANAEROBIC Blood Culture adequate volume   Culture   Final    NO GROWTH 1 DAY Performed at [facility], [place] [address] [place], [place] [address]    Report Status PENDING  Incomplete    FURTHER DISCHARGE INSTRUCTIONS:  Get Medicines reviewed and adjusted: Please take all your medications with you for your next visit with your Primary MD  Laboratory/radiological  data: Please request your Primary MD to go over all hospital tests and procedure/radiological results at the follow up, please ask your Primary MD to get all Hospital records sent to his/her office.  In some cases, they will be blood work, cultures and biopsy results pending at the time of your discharge. Please request that your primary care M.D. goes through all the records of your hospital data and follows up on these results.  Also Note the following: If you experience worsening of your admission symptoms, develop shortness of breath, life threatening emergency, suicidal or homicidal thoughts you must seek medical attention immediately by calling 911 or calling your MD immediately  if symptoms less severe.  You must read complete instructions/literature along with all the possible adverse reactions/side effects for all the Medicines you take and that have been prescribed to you. Take any new Medicines after you have completely understood and accpet all the possible adverse reactions/side effects.   Do not drive when taking Pain medications or sleeping medications (Benzodaizepines)  Do not take more than prescribed Pain, Sleep and Anxiety Medications. It is not advisable to combine anxiety,sleep and pain medications without talking with your primary care practitioner  Special Instructions: If you have smoked or chewed Tobacco  in the last 2 yrs please stop smoking, stop any regular Alcohol  and or any Recreational drug use.  Wear Seat belts while driving.  Please note: You were cared for by a hospitalist during your hospital stay. Once you are discharged, your primary care physician will handle any further medical issues. Please note that NO REFILLS for any discharge medications will be authorized once you are discharged, as it is imperative that you return to your primary care physician (or establish a relationship with a primary care physician if you do not have one) for your post hospital  discharge needs so that they can reassess your need for medications and monitor your lab values.  Total Time spent coordinating discharge including counseling, education and face to face time equals  45 minutes.  SignedOren Binet 11/05/2020 10:32 AM

## 2020-11-05 NOTE — Plan of Care (Signed)
VSS. Remains on room air. CIWA Q4. MIVF. Voiding. OOB ambulating to BR. No complaints overnight. Call bell within reach. Bed alarm on.   Problem: Education: Goal: Knowledge of risk factors and measures for prevention of condition will improve Outcome: Progressing   Problem: Coping: Goal: Psychosocial and spiritual needs will be supported Outcome: Progressing   Problem: Respiratory: Goal: Will maintain a patent airway Outcome: Progressing Goal: Complications related to the disease process, condition or treatment will be avoided or minimized Outcome: Progressing

## 2020-11-05 NOTE — Discharge Instructions (Signed)
Person Under Monitoring Name: [name]  Location: [place] [id number]   Infection Prevention Recommendations for Individuals Confirmed to have, or Being Evaluated for, 2019 Novel Coronavirus (COVID-19) Infection Who Receive Care at Home  Individuals who are confirmed to have, or are being evaluated for, COVID-19 should follow the prevention steps below until a healthcare provider or local or state health department says they can return to normal activities.  Stay home except to get medical care You should restrict activities outside your home, except for getting medical care. Do not go to work, school, or public areas, and do not use public transportation or taxis.  Call ahead before visiting your doctor Before your medical appointment, call the healthcare provider and tell them that you have, or are being evaluated for, COVID-19 infection. This will help the healthcare provider's office take steps to keep other people from getting infected. Ask your healthcare provider to call the local or state health department.  Monitor your symptoms Seek prompt medical attention if your illness is worsening (e.g., difficulty breathing). Before going to your medical appointment, call the healthcare provider and tell them that you have, or are being evaluated for, COVID-19 infection. Ask your healthcare provider to call the local or state health department.  Wear a facemask You should wear a facemask that covers your nose and mouth when you are in the same room with other people and when you visit a healthcare provider. People who live with or visit you should also wear a facemask while they are in the same room with you.  Separate yourself from other people in your home As much as possible, you should stay in a different room from other people in your home. Also, you should use a separate bathroom, if available.  Avoid sharing household items You should not  share dishes, drinking glasses, cups, eating utensils, towels, bedding, or other items with other people in your home. After using these items, you should wash them thoroughly with soap and water.  Cover your coughs and sneezes Cover your mouth and nose with a tissue when you cough or sneeze, or you can cough or sneeze into your sleeve. Throw used tissues in a lined trash can, and immediately wash your hands with soap and water for at least 20 seconds or use an alcohol-based hand rub.  Wash your [facility] your hands often and thoroughly with soap and water for at least 20 seconds. You can use an alcohol-based hand sanitizer if soap and water are not available and if your hands are not visibly dirty. Avoid touching your eyes, nose, and mouth with unwashed hands.   Prevention Steps for Caregivers and Household Members of Individuals Confirmed to have, or Being Evaluated for, COVID-19 Infection Being Cared for in the Home  If you live with, or provide care at home for, a person confirmed to have, or being evaluated for, COVID-19 infection please follow these guidelines to prevent infection:  Follow healthcare provider's instructions Make sure that you understand and can help the patient follow any healthcare provider instructions for all care.  Provide for the patient's basic needs You should help the patient with basic needs in the home and provide support for getting groceries, prescriptions, and other personal needs.  Monitor the patient's symptoms If they are getting sicker, call his or her medical provider and tell them that the patient has, or is being evaluated for, COVID-19 infection. This will help the healthcare provider's  office take steps to keep other people from getting infected. Ask the healthcare provider to call the local or state health department.  Limit the number of people who have contact with the patient  If possible, have only one caregiver for the  patient.  Other household members should stay in another home or place of residence. If this is not possible, they should stay  in another room, or be separated from the patient as much as possible. Use a separate bathroom, if available.  Restrict visitors who do not have an essential need to be in the home.  Keep older adults, very young children, and other sick people away from the patient Keep older adults, very young children, and those who have compromised immune systems or chronic health conditions away from the patient. This includes people with chronic heart, lung, or kidney conditions, diabetes, and cancer.  Ensure good ventilation Make sure that shared spaces in the home have good air flow, such as from an air conditioner or an opened window, weather permitting.  Wash your hands often  Wash your hands often and thoroughly with soap and water for at least 20 seconds. You can use an alcohol based hand sanitizer if soap and water are not available and if your hands are not visibly dirty.  Avoid touching your eyes, nose, and mouth with unwashed hands.  Use disposable paper towels to dry your hands. If not available, use dedicated cloth towels and replace them when they become wet.  Wear a facemask and gloves  Wear a disposable facemask at all times in the room and gloves when you touch or have contact with the patient's blood, body fluids, and/or secretions or excretions, such as sweat, saliva, sputum, nasal mucus, vomit, urine, or feces.  Ensure the mask fits over your nose and mouth tightly, and do not touch it during use.  Throw out disposable facemasks and gloves after using them. Do not reuse.  Wash your hands immediately after removing your facemask and gloves.  If your personal clothing becomes contaminated, carefully remove clothing and launder. Wash your hands after handling contaminated clothing.  Place all used disposable facemasks, gloves, and other waste in a lined  container before disposing them with other household waste.  Remove gloves and wash your hands immediately after handling these items.  Do not share dishes, glasses, or other household items with the patient  Avoid sharing household items. You should not share dishes, drinking glasses, cups, eating utensils, towels, bedding, or other items with a patient who is confirmed to have, or being evaluated for, COVID-19 infection.  After the person uses these items, you should wash them thoroughly with soap and water.  Wash laundry thoroughly  Immediately remove and wash clothes or bedding that have blood, body fluids, and/or secretions or excretions, such as sweat, saliva, sputum, nasal mucus, vomit, urine, or feces, on them.  Wear gloves when handling laundry from the patient.  Read and follow directions on labels of laundry or clothing items and detergent. In general, wash and dry with the warmest temperatures recommended on the label.  Clean all areas the individual has used often  Clean all touchable surfaces, such as counters, tabletops, doorknobs, bathroom fixtures, toilets, phones, keyboards, tablets, and bedside tables, every day. Also, clean any surfaces that may have blood, body fluids, and/or secretions or excretions on them.  Wear gloves when cleaning surfaces the patient has come in contact with.  Use a diluted bleach solution (e.g., dilute bleach with 1  part bleach and 10 parts water) or a household disinfectant with a label that says EPA-registered for coronaviruses. To make a bleach solution at home, add 1 tablespoon of bleach to 1 quart (4 cups) of water. For a larger supply, add  cup of bleach to 1 gallon (16 cups) of water.  Read labels of cleaning products and follow recommendations provided on product labels. Labels contain instructions for safe and effective use of the cleaning product including precautions you should take when applying the product, such as wearing gloves or  eye protection and making sure you have good ventilation during use of the product.  Remove gloves and wash hands immediately after cleaning.  Monitor yourself for signs and symptoms of illness Caregivers and household members are considered close contacts, should monitor their health, and will be asked to limit movement outside of the home to the extent possible. Follow the monitoring steps for close contacts listed on the symptom monitoring form.   ? If you have additional questions, contact your local health department or call the epidemiologist on call at [phone] (available 24/7). ? This guidance is subject to change. For the most up-to-date guidance from [facility], please refer to their website: [email or web address]

## 2020-11-05 NOTE — Evaluation (Signed)
Physical Therapy Evaluation & Discharge Patient Details Name: [name] MRN: [id number] DOB: Sep 07, 1966 Today's Date: 11/05/2020   History of Present Illness  Pt is a 55 y.o. male admitted 11/02/20 with AMS and frequent falls; workup for rhabdomyolysis, incidental (+) COVID-19. Head CT negative for acute abnormality. UDS (+) benzos, cocaine, tetrahydrocannabinol. PMH includes HTN, bipolar disorder, OSA, anxiety.    Clinical Impression  Patient evaluated by Physical Therapy with no further acute PT needs identified. PTA, pt independent, lives alone, works. Today, pt mobilizing well independent without DME; Dynamic Gait Index score of 22/24 does not indicate risk for falls with higher level balance activities. All education has been completed and the patient has no further questions. Acute PT is signing off. Thank you for this referral.    Follow Up Recommendations No PT follow up    Equipment Recommendations  None recommended by PT    Recommendations for Other Services       Precautions / Restrictions Precautions Precautions: None Restrictions Weight Bearing Restrictions: No      Mobility  Bed Mobility               General bed mobility comments: Received using bathroom    Transfers Overall transfer level: Independent Equipment used: None             General transfer comment: Indep to stand from toilet and chair  Ambulation/Gait Ambulation/Gait assistance: Independent Gait Distance (Feet): 350 Feet Assistive device: None;IV Pole Gait Pattern/deviations: Step-through pattern;Decreased stride length   Gait velocity interpretation: >2.62 ft/sec, indicative of community ambulatory    Stairs            Wheelchair Mobility    Modified Rankin (Stroke Patients Only)       Balance Overall balance assessment: Independent   Sitting balance-Leahy Scale: Normal       Standing balance-Leahy Scale: Good                   Standardized Balance  Assessment Standardized Balance Assessment : Dynamic Gait Index   Dynamic Gait Index Level Surface: Normal Change in Gait Speed: Normal Gait with Horizontal Head Turns: Normal Gait with Vertical Head Turns: Mild Impairment Gait and Pivot Turn: Normal Step Over Obstacle: Mild Impairment Step Around Obstacles: Normal Steps: Normal Total Score: 22       Pertinent Vitals/Pain Pain Assessment: Faces Faces Pain Scale: Hurts little more Pain Location: Mid-back Pain Descriptors / Indicators: Sore Pain Intervention(s): Limited activity within patient's tolerance;Other (comment) (stretches)    Home Living Family/patient expects to be discharged to:: Private residence Living Arrangements: Alone Available Help at Discharge: Available PRN/intermittently Type of Home: House Home Access: Stairs to enter     Home Layout: Two level Home Equipment: None      Prior Function Level of Independence: Independent         Comments: Drives, owns car shop     Hand Dominance        Extremity/Trunk Assessment   Upper Extremity Assessment Upper Extremity Assessment: Overall WFL for tasks assessed    Lower Extremity Assessment Lower Extremity Assessment: Overall WFL for tasks assessed       Communication   Communication: No difficulties  Cognition Arousal/Alertness: Awake/alert Behavior During Therapy: WFL for tasks assessed/performed Overall Cognitive Status: Within Functional Limits for tasks assessed  General Comments      Exercises Other Exercises Other Exercises: Standing lat stretch, standing mid back rotation stretch, seated mid back rotation stretch   Assessment/Plan    PT Assessment Patent does not need any further PT services  PT Problem List         PT Treatment Interventions      PT Goals (Current goals can be found in the Care Plan section)  Acute Rehab PT Goals PT Goal Formulation: All assessment  and education complete, DC therapy    Frequency     Barriers to discharge        Co-evaluation               AM-PAC PT "6 Clicks" Mobility  Outcome Measure Help needed turning from your back to your side while in a flat bed without using bedrails?: None Help needed moving from lying on your back to sitting on the side of a flat bed without using bedrails?: None Help needed moving to and from a bed to a chair (including a wheelchair)?: None Help needed standing up from a chair using your arms (e.g., wheelchair or bedside chair)?: None Help needed to walk in hospital room?: None Help needed climbing 3-5 steps with a railing? : None 6 Click Score: 24    End of Session   Activity Tolerance: Patient tolerated treatment well Patient left: in chair;with call bell/phone within reach Nurse Communication: Mobility status PT Visit Diagnosis: Other abnormalities of gait and mobility (R26.89)    Time: 1007-1020 PT Time Calculation (min) (ACUTE ONLY): 13 min   Charges:   PT Evaluation $PT Eval Low Complexity: [place], PT, DPT Acute Rehabilitation Services  Pager [phone] Office [place] 11/05/2020, 12:32 PM

## 2020-11-05 NOTE — Plan of Care (Signed)
  Problem: Education: Goal: Knowledge of General Education information will improve Description: Including pain rating scale, medication(s)/side effects and non-pharmacologic comfort measures Outcome: Adequate for Discharge   Problem: Health Behavior/Discharge Planning: Goal: Ability to manage health-related needs will improve Outcome: Adequate for Discharge   Problem: Clinical Measurements: Goal: Ability to maintain clinical measurements within normal limits will improve Outcome: Adequate for Discharge Goal: Will remain free from infection Outcome: Adequate for Discharge Goal: Diagnostic test results will improve Outcome: Adequate for Discharge Goal: Respiratory complications will improve Outcome: Adequate for Discharge Goal: Cardiovascular complication will be avoided Outcome: Adequate for Discharge   Problem: Activity: Goal: Risk for activity intolerance will decrease Outcome: Adequate for Discharge   Problem: Pain Managment: Goal: General experience of comfort will improve Outcome: Adequate for Discharge   Problem: Safety: Goal: Ability to remain free from injury will improve Outcome: Adequate for Discharge   Problem: Education: Goal: Knowledge of risk factors and measures for prevention of condition will improve Outcome: Adequate for Discharge   Problem: Coping: Goal: Psychosocial and spiritual needs will be supported Outcome: Adequate for Discharge   Problem: Respiratory: Goal: Will maintain a patent airway Outcome: Adequate for Discharge Goal: Complications related to the disease process, condition or treatment will be avoided or minimized Outcome: Adequate for Discharge

## 2020-11-07 LAB — CULTURE, BLOOD (ROUTINE X 2)
Culture: NO GROWTH
Special Requests: ADEQUATE

## 2020-11-08 ENCOUNTER — Other Ambulatory Visit: Payer: Self-pay

## 2020-11-08 ENCOUNTER — Emergency Department (HOSPITAL_COMMUNITY): Payer: BLUE CROSS/BLUE SHIELD

## 2020-11-08 ENCOUNTER — Emergency Department (HOSPITAL_COMMUNITY)
Admission: EM | Admit: 2020-11-08 | Discharge: 2020-11-09 | Disposition: A | Discharge status: Home / Self Care | Payer: BLUE CROSS/BLUE SHIELD | Attending: Emergency Medicine | Admitting: Emergency Medicine

## 2020-11-08 DIAGNOSIS — F17210 Nicotine dependence, cigarettes, uncomplicated: Secondary | ICD-10-CM | POA: Diagnosis not present

## 2020-11-08 DIAGNOSIS — K449 Diaphragmatic hernia without obstruction or gangrene: Secondary | ICD-10-CM | POA: Insufficient documentation

## 2020-11-08 DIAGNOSIS — I10 Essential (primary) hypertension: Secondary | ICD-10-CM | POA: Insufficient documentation

## 2020-11-08 DIAGNOSIS — R918 Other nonspecific abnormal finding of lung field: Secondary | ICD-10-CM

## 2020-11-08 DIAGNOSIS — R079 Chest pain, unspecified: Secondary | ICD-10-CM | POA: Diagnosis present

## 2020-11-08 DIAGNOSIS — R911 Solitary pulmonary nodule: Secondary | ICD-10-CM | POA: Diagnosis not present

## 2020-11-08 DIAGNOSIS — Z79899 Other long term (current) drug therapy: Secondary | ICD-10-CM | POA: Diagnosis not present

## 2020-11-08 DIAGNOSIS — R0789 Other chest pain: Secondary | ICD-10-CM

## 2020-11-08 DIAGNOSIS — K219 Gastro-esophageal reflux disease without esophagitis: Secondary | ICD-10-CM | POA: Insufficient documentation

## 2020-11-08 LAB — TROPONIN I (HIGH SENSITIVITY): Troponin I (High Sensitivity): 7 ng/L (ref <18)

## 2020-11-08 LAB — CBC
HCT: 44.9 % (ref 39.0–52.0)
Hemoglobin: 15.1 g/dL (ref 13.0–17.0)
MCH: 31.1 pg (ref 26.0–34.0)
MCHC: 33.6 g/dL (ref 30.0–36.0)
MCV: 92.6 fL (ref 80.0–100.0)
Platelets: 391 10*3/uL (ref 150–400)
RBC: 4.85 MIL/uL (ref 4.22–5.81)
RDW: 12.4 % (ref 11.5–15.5)
WBC: 11 10*3/uL — ABNORMAL HIGH (ref 4.0–10.5)
nRBC: 0 % (ref 0.0–0.2)

## 2020-11-08 LAB — RAPID URINE DRUG SCREEN, HOSP PERFORMED
Amphetamines: NOT DETECTED
Barbiturates: NOT DETECTED
Benzodiazepines: POSITIVE — AB
Cocaine: NOT DETECTED
Opiates: POSITIVE — AB
Tetrahydrocannabinol: POSITIVE — AB

## 2020-11-08 LAB — BASIC METABOLIC PANEL
Anion gap: 9 (ref 5–15)
BUN: 8 mg/dL (ref 6–20)
CO2: 27 mmol/L (ref 22–32)
Calcium: 9.2 mg/dL (ref 8.9–10.3)
Chloride: 97 mmol/L — ABNORMAL LOW (ref 98–111)
Creatinine, Ser: 0.98 mg/dL (ref 0.61–1.24)
Glucose, Bld: 90 mg/dL (ref 70–99)
Potassium: 4.2 mmol/L (ref 3.5–5.1)
Sodium: 133 mmol/L — ABNORMAL LOW (ref 135–145)

## 2020-11-08 NOTE — ED Triage Notes (Signed)
Pt BIB [facility] for eval of CP that radiates FROM back to chest. EMS gave 1NTG & 324 ASA en route, no change to pain. EKG unremarkable, 6/10, VSS en route.   Hx anxiety, cocaine abuse, kidney problems

## 2020-11-09 ENCOUNTER — Emergency Department (HOSPITAL_COMMUNITY): Payer: BLUE CROSS/BLUE SHIELD

## 2020-11-09 ENCOUNTER — Other Ambulatory Visit: Payer: Self-pay

## 2020-11-09 LAB — CULTURE, BLOOD (ROUTINE X 2)
Culture: NO GROWTH
Special Requests: ADEQUATE

## 2020-11-09 LAB — TROPONIN I (HIGH SENSITIVITY): Troponin I (High Sensitivity): 6 ng/L (ref <18)

## 2020-11-09 MED ORDER — PANTOPRAZOLE SODIUM 40 MG IV SOLR
40.0000 mg | Freq: Once | INTRAVENOUS | Status: AC
  Administered 2020-11-09: 40 mg via INTRAVENOUS
  Filled 2020-11-09: qty 40

## 2020-11-09 MED ORDER — LORAZEPAM 2 MG/ML IJ SOLN
1.0000 mg | Freq: Once | INTRAMUSCULAR | Status: AC
  Administered 2020-11-09: 1 mg via INTRAVENOUS
  Filled 2020-11-09: qty 1

## 2020-11-09 MED ORDER — PANTOPRAZOLE SODIUM 20 MG PO TBEC: 20.0000 mg | DELAYED_RELEASE_TABLET | Freq: Every day | ORAL | 0 refills | Status: DC

## 2020-11-09 MED ORDER — IOHEXOL 350 MG/ML SOLN
100.0000 mL | Freq: Once | INTRAVENOUS | Status: AC | PRN
  Administered 2020-11-09: 100 mL via INTRAVENOUS

## 2020-11-09 MED ORDER — SODIUM CHLORIDE 0.9 % IV BOLUS
500.0000 mL | Freq: Once | INTRAVENOUS | Status: AC
  Administered 2020-11-09: 500 mL via INTRAVENOUS

## 2020-11-09 NOTE — ED Provider Notes (Signed)
[facility] EMERGENCY DEPARTMENT Provider Note   CSN: [id number] Arrival date & time: 11/08/20  2204     History Chief Complaint  Patient presents with  . Chest Pain  . Back Pain    [name] is a 55 y.o. male history GERD, bipolar, hypertension, hyperlipidemia, smoker, polysubstance abuse.  Patient arrived last night via EMS for chest pain, onset around 7-8 PM patient reports he was standing around and the pain began and was not exerting himself prior to onset he describes central chest pressure that present in the front and back of his chest simultaneously but does not radiate pain has been constant moderate intensity.  Patient received nitro and EMS prior to arrival without improvement of symptoms.  Patient denies any recent cocaine use reports has been over a week since he used.  Denies any additional concerns today.  Denies fever/chills, headache, cough/hemoptysis, shortness of breath, abdominal pain, nausea/vomiting, diarrhea, diaphoresis, extremity swelling/color change or any additional concerns   HPI     Past Medical History:  Diagnosis Date  . Anxiety   . Arthritis    degeneration of spine & scoliosis  . Bipolar disorder ([place])   . GERD (gastroesophageal reflux disease)   . H/O echocardiogram ?2015  . Hyperlipidemia   . Hypertension   . Pneumonia 2002   [place] admission   . Shortness of breath dyspnea   . Sleep apnea    test 3/15-waiting results-was told oxygen did drop- did additional study done at home, told that he doesn't have sleep apnea     Patient Active Problem List   Diagnosis Date Noted  . Polysubstance abuse ([place]) 11/02/2020  . Rhabdomyolysis 11/02/2020  . 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 Current nicotine use 10/26/2018  . Benign neoplasm of ascending colon   . DDD (degenerative disc disease), cervical 12/17/2017  . Lumbar radiculopathy 12/17/2017  . Neck pain 08/17/2015  . Special screening for  malignant neoplasms, colon 08/04/2015  . Low testosterone 05/02/2015  . Back pain 04/28/2015  . Hemorrhoid 04/28/2015  . Ganglion cyst of wrist 11/20/2013  . Testosterone deficiency 11/19/2011  . HLD (hyperlipidemia) 10/25/2010  . Bipolar disorder ([place]) 10/25/2010  . TOBACCO ABUSE 10/25/2010  . Essential hypertension 10/25/2010  . GERD 10/25/2010    Past Surgical History:  Procedure Laterality Date  . ANTERIOR CERVICAL DECOMP/DISCECTOMY FUSION N/A 08/17/2015   Procedure: ANTERIOR CERVICAL DECOMPRESSION/DISCECTOMY FUSION Cervical five-cervical seven. Two LEVELS;  Surgeon: Melina Schools, MD;  Location: [facility];  Service: Orthopedics;  Laterality: N/A;  . BIOPSY  07/15/2018   Procedure: BIOPSY;  Surgeon: Gatha Mayer, MD;  Location: [facility];  Service: Endoscopy;;  . BRONCHOSCOPY    . COLONOSCOPY WITH PROPOFOL N/A 07/15/2018   Procedure: COLONOSCOPY WITH PROPOFOL;  Surgeon: Gatha Mayer, MD;  Location: [facility];  Service: Endoscopy;  Laterality: N/A;  . MASS EXCISION Left 12/17/2013   Procedure: EXCISION MASS LEFT WRIST;  Surgeon: Tennis Must, MD;  Location: [facility];  Service: Orthopedics;  Laterality: Left;  . SEPTOPLASTY  1990       Family History  Problem Relation Age of Onset  . Cancer Mother        melanoma  . Depression Other   . Cancer Other        skin  . Colon polyps Neg Hx   . Esophageal cancer Neg Hx   . Rectal cancer Neg Hx   . Stomach cancer Neg Hx  Social History   Tobacco Use  . Smoking status: Current Every Day Smoker    Packs/day: 0.50    Types: Cigarettes  . Smokeless tobacco: Former Network engineer  . Vaping Use: Former  Substance Use Topics  . Alcohol use: No    Alcohol/week: 0.0 standard drinks    Comment: quit 2007  . Drug use: No    Comment: history cocaine-last 2006    Home Medications Prior to Admission medications   Medication Sig Start Date End Date Taking? Authorizing Provider  pantoprazole  (PROTONIX) 20 MG tablet Take 1 tablet (20 mg total) by mouth daily. 11/09/20  Yes Nuala Alpha A, PA-C  albuterol (PROAIR HFA) 108 (90 Base) MCG/ACT inhaler Inhale 1-2 puffs into the lungs every 6 (six) hours as needed for wheezing or shortness of breath. 04/09/18   Elby Beck, FNP  ALPRAZolam Duanne Moron) 1 MG tablet Take 1 mg by mouth 3 (three) times daily as needed for anxiety.     [provider]  B-D 3CC LUER-LOK SYR 22GX1-1/2 22G X 1-1/2" 3 ML MISC  08/14/19   [provider]  clonazePAM (KLONOPIN) 2 MG tablet Take 2 mg by mouth at bedtime.     [provider]  diphenhydrAMINE (BENADRYL) 25 MG tablet Take 50 mg by mouth at bedtime as needed for sleep or allergies.    [provider]  fluticasone (FLONASE) 50 MCG/ACT nasal spray Place 2 sprays into both nostrils daily. 04/29/19   Elby Beck, FNP  lamoTRIgine (LAMICTAL) 150 MG tablet Take 150 mg by mouth at bedtime.    [provider]  LATUDA 120 MG TABS Take 120 mg by mouth daily after supper.  08/30/17   [provider]  lisinopril (ZESTRIL) 20 MG tablet Take 1 tablet (20 mg total) by mouth daily. 11/20/19   Elby Beck, FNP  methocarbamol (ROBAXIN) 500 MG tablet Take 1 tablet (500 mg total) by mouth 3 (three) times daily as needed for muscle spasms. Patient taking differently: Take 500 mg by mouth at bedtime. 08/17/15   Melina Schools, MD  naloxone [facility]) 4 MG/0.1ML LIQD nasal spray kit Place 4 mg into the nose once. As directed    [provider]  oxyCODONE-acetaminophen (PERCOCET) 10-325 MG tablet Take 1 tablet by mouth every 4 (four) hours as needed for pain. Patient taking differently: Take 1 tablet by mouth every 8 (eight) hours as needed for pain. 08/17/15   Melina Schools, MD  QUEtiapine (SEROQUEL) 300 MG tablet Take 450 mg by mouth at bedtime. Taking 1 & 1/2 tabs = 450 mg    [provider]  sertraline (ZOLOFT) 100 MG tablet Take 100 mg by mouth  daily.    [provider]  sildenafil (REVATIO) 20 MG tablet Take 2-5 tablets (40-100 mg total) by mouth daily as needed (for erectile dysfunction.). 06/02/20   Elby Beck, FNP  simvastatin (ZOCOR) 40 MG tablet TAKE 1 TABLET BY MOUTH DAILY AT 6 PM. Patient taking differently: Take 40 mg by mouth daily at 6 PM. TAKE 1 TABLET BY MOUTH DAILY AT 6 PM. 09/12/20   Elby Beck, FNP  tamsulosin (FLOMAX) 0.4 MG CAPS capsule Take 0.4 mg by mouth at bedtime.    [provider]  testosterone cypionate (DEPOTESTOTERONE CYPIONATE) 100 MG/ML injection Inject 100 mg into the muscle once a week. 11/19/19   [provider]  zolpidem (AMBIEN) 10 MG tablet Take 10 mg by mouth at bedtime.  09/16/17  [provider]  lansoprazole (PREVACID) 30 MG capsule TAKE 1 CAPSULE BY MOUTH 30 MINUTES BEFORE BREAKFAST AND DINNER DAILY FOR 4 WEEKS, THEN TAKE ONCE A DAY BEFORE DINNER. Patient not taking: Reported on 11/03/2020 08/19/19 11/09/20  Elby Beck, FNP    Allergies    Patient has no known allergies.  Review of Systems   Review of Systems Ten systems are reviewed and are negative for acute change except as noted in the HPI  Physical Exam Updated Vital Signs BP (!) 141/93 (BP Location: Left Arm)   Pulse 80   Temp 98.2 F (36.8 C) (Oral)   Resp 17   SpO2 100%   Physical Exam Constitutional:      General: He is not in acute distress.    Appearance: Normal appearance. He is well-developed. He is not ill-appearing or diaphoretic.  HENT:     Head: Normocephalic and atraumatic.  Eyes:     General: Vision grossly intact. Gaze aligned appropriately.     Pupils: Pupils are equal, round, and reactive to light.  Neck:     Trachea: Trachea and phonation normal.  Cardiovascular:     Rate and Rhythm: Normal rate and regular rhythm.     Pulses:          Dorsalis pedis pulses are 2+ on the right side and 2+ on the left side.     Heart sounds: Normal heart sounds.   Pulmonary:     Effort: Pulmonary effort is normal. No respiratory distress.  Abdominal:     General: There is no distension.     Palpations: Abdomen is soft.     Tenderness: There is no abdominal tenderness. There is no guarding or rebound.  Musculoskeletal:        General: Normal range of motion.     Cervical back: Normal range of motion.     Right lower leg: No tenderness. No edema.     Left lower leg: No tenderness. No edema.  Skin:    General: Skin is warm and dry.  Neurological:     Mental Status: He is alert.     GCS: GCS eye subscore is 4. GCS verbal subscore is 5. GCS motor subscore is 6.     Comments: Speech is clear and goal oriented, follows commands Major Cranial nerves without deficit, no facial droop Moves extremities without ataxia, coordination intact  Psychiatric:        Behavior: Behavior normal.     ED Results / Procedures / Treatments   Labs (all labs ordered are listed, but only abnormal results are displayed) Labs Reviewed  BASIC METABOLIC PANEL - Abnormal; Notable for the following components:      Result Value   Sodium 133 (*)    Chloride 97 (*)    All other components within normal limits  CBC - Abnormal; Notable for the following components:   WBC 11.0 (*)    All other components within normal limits  RAPID URINE DRUG SCREEN, HOSP PERFORMED - Abnormal; Notable for the following components:   Opiates POSITIVE (*)    Benzodiazepines POSITIVE (*)    Tetrahydrocannabinol POSITIVE (*)    All other components within normal limits  TROPONIN I (HIGH SENSITIVITY)  TROPONIN I (HIGH SENSITIVITY)    EKG EKG Interpretation  Date/Time:  Wednesday November 09 2020 07:47:06 EST Ventricular Rate:  97 PR Interval:  118 QRS Duration: 82 QT Interval:  346 QTC Calculation: 439 R Axis:   87 Text Interpretation: Normal  sinus rhythm Normal ECG Confirmed by Lennice Sites [phone]) on 11/09/2020 7:50:44 AM   Radiology DG Chest 2 View  Result Date:  11/08/2020 CLINICAL DATA:  Chest pain and shortness of breath. EXAM: CHEST - 2 VIEW COMPARISON:  November 03, 2020 FINDINGS: The heart size and mediastinal contours are within normal limits. Both lungs are clear. Chronic left-sided rib fractures are seen. A radiopaque fusion plate and screws are seen overlying the lower cervical spine. IMPRESSION: No active cardiopulmonary disease. Electronically Signed   By: Virgina Norfolk M.D.   On: 11/08/2020 22:28   CT Angio Chest PE W and/or Wo Contrast  Result Date: 11/09/2020 CLINICAL DATA:  Chest pain EXAM: CT ANGIOGRAPHY CHEST WITH CONTRAST TECHNIQUE: Multidetector CT imaging of the chest was performed using the standard protocol during bolus administration of intravenous contrast. Multiplanar CT image reconstructions and MIPs were obtained to evaluate the vascular anatomy. CONTRAST:  168m OMNIPAQUE IOHEXOL 350 MG/ML SOLN COMPARISON:  Chest radiograph November 08, 2020 FINDINGS: Cardiovascular: There is no evident pulmonary embolus. There is no thoracic aortic aneurysm or dissection. A small focus of calcification is noted in the proximal right innominate artery. Visualized great vessels otherwise appear unremarkable. Note that the right innominate and left common carotid arteries arise as a common trunk, an anatomic variant. There is no appreciable pericardial effusion or pericardial thickening. Mediastinum/Nodes: Thyroid appears unremarkable. There is no evident thoracic adenopathy. There is a rather minimal hiatal hernia. Lungs/Pleura: There are scattered areas of mild scarring in the extreme apices. There is atelectatic change with questionable scarring in the anterior segment of the left upper lobe. There is no edema or airspace opacity. No evident pleural effusions. On axial slice 83 series 8, there is a 5 mm nodular opacity in the posterior segment of the left lower lobe. On axial slice 46 series 8, there is a 4 mm nodular opacity in the anterior segment of  the right upper lobe. On axial slice 34 series 8, there is a 2 mm nodular opacity abutting the pleura in the anterior segment right upper lobe. There are several 4-5 mm areas of what may represent peripheral right upper lobe bronchiolectasis. No larger nodular type opacities evident. Upper Abdomen: There is a 5 mm probable cyst in the anterior segment right lobe of the liver. Visualized upper abdominal structures otherwise appear unremarkable. Musculoskeletal: There is postoperative change in the lower cervical region. There is mild anterior wedging of midthoracic vertebral bodies, unchanged from recent chest radiograph. No evident blastic or lytic bone lesions. No chest wall lesions. Review of the MIP images confirms the above findings. IMPRESSION: 1. No demonstrable pulmonary embolus. No thoracic aortic aneurysm or dissection. 2. No edema or airspace opacity. Areas of mild scarring. Nodular opacities ranging in size from 2-5 mm noted. No larger nodular opacities evident. No follow-up needed if patient is low-risk (and has no known or suspected primary neoplasm). Non-contrast chest CT can be considered in 12 months if patient is high-risk. This recommendation follows the consensus statement: Guidelines for Management of Incidental Pulmonary Nodules Detected on CT Images: From the Fleischner Society 2017; Radiology 2017; 284:228-243. 3.  No evident adenopathy. 4.  Rather minimal hiatal hernia. Electronically Signed   By: WLowella GripIII M.D.   On: 11/09/2020 11:03    Procedures Procedures   Medications Ordered in ED Medications  pantoprazole (PROTONIX) injection 40 mg (40 mg Intravenous Given 11/09/20 0837)  sodium chloride 0.9 % bolus 500 mL (0 mLs Intravenous Stopped 11/09/20 1120)  LORazepam (  ATIVAN) injection 1 mg (1 mg Intravenous Given 11/09/20 0837)  iohexol (OMNIPAQUE) 350 MG/ML injection 100 mL (100 mLs Intravenous Contrast Given 11/09/20 1038)    ED Course  I have reviewed the triage vital  signs and the nursing notes.  Pertinent labs & imaging results that were available during my care of the patient were reviewed by me and considered in my medical decision making (see chart for details).    MDM Rules/Calculators/A&P                         Additional history obtained from: 1. Nursing notes from this visit. 2. Review of electronic medical records.  Patient was admitted to the hospital 11/02/2018, discharged 11/05/2018.  During that visit he was diagnosed with rhabdomyolysis, transaminitis, seizure, chronic pain, bipolar, BPH, hyperlipidemia, hypertension, polysubstance abuse and incidental Covid infection. -------------- I reviewed and interpreted labs which include: Initial and delta troponin levels are within normal limits (7-> 6) CBC shows mild leukocytosis of 11.0, no anemia. BMP shows no emergent electrolyte derangement, AKI or gap. UDS positive for opioids, benzodiazepines and THC.  CXR:  IMPRESSION:  No active cardiopulmonary disease.   EKG: Normal sinus rhythm Normal ECG Confirmed by Addison Lank [phone]) on 11/09/2020 6:33:19 AM  Above work-up was obtained in triage as patient had prolonged wait in the ER prior to initial evaluation.  Overall it is reassuring, there is no indication for additional troponin levels.  Possible the patient symptoms may be secondary to his reflux disease will give Protonix and IV fluids.  Case was discussed with attending physician Dr. Ronnald Nian, given patient's recent Covid infection will obtain CT angio chest to rule out pulmonary embolism.  CT angio chest PE study:  IMPRESSION:  1. No demonstrable pulmonary embolus. No thoracic aortic aneurysm or  dissection.    2. No edema or airspace opacity. Areas of mild scarring. Nodular  opacities ranging in size from 2-5 mm noted. No larger nodular  opacities evident. No follow-up needed if patient is low-risk (and  has no known or suspected primary neoplasm). Non-contrast chest CT  can be  considered in 12 months if patient is high-risk. This  recommendation follows the consensus statement: Guidelines for  Management of Incidental Pulmonary Nodules Detected on CT Images:  From the Fleischner Society 2017; Radiology 2017; 284:228-243.    3. No evident adenopathy.    4. Rather minimal hiatal hernia.  - Patient was updated on findings as above and he states understanding he is aware that he will need a follow-up CT scan for evaluation of his pulmonary nodules given that he does use cigarettes.  On reassessment patient reports he is feeling well no pain and would like to go home he is requesting a coffee before discharge.  Suspect his symptoms may be secondary to GERD today low suspicion for ACS as cause of his pain, additionally no evidence for PE dissection pneumonia anemia or other emergent cardiopulmonary etiology of his symptoms today.  Patient's heart score is elevated at 4, patient would still like discharge I have placed an ambulatory referral to cardiology group and I have encouraged patient to call their office today to schedule follow-up appointment.  He will also be prescribed Protonix for likely reflux related symptoms today.  At this time there does not appear to be any evidence of an acute emergency medical condition and the patient appears stable for discharge with appropriate outpatient follow up. Diagnosis was discussed with patient who verbalizes  understanding of care plan and is agreeable to discharge. I have discussed return precautions with patient who verbalizes understanding. Patient encouraged to follow-up with their PCP and Cardiology. All questions answered.  Patient's case discussed with Dr. Ronnald Nian who agrees with plan to discharge with follow-up.   Note: Portions of this report may have been transcribed using voice recognition software. Every effort was made to ensure accuracy; however, inadvertent computerized transcription errors may still be  present. Final Clinical Impression(s) / ED Diagnoses Final diagnoses:  Atypical chest pain  Pulmonary nodules  Hiatal hernia    Rx / DC Orders ED Discharge Orders         Ordered    Ambulatory referral to Cardiology        11/09/20 1140    pantoprazole (PROTONIX) 20 MG tablet  Daily        11/09/20 1140           Deliah Boston, PA-C 11/09/20 1141    Lennice Sites, DO 11/09/20 1326

## 2020-11-09 NOTE — ED Notes (Signed)
Pt pulled out his IV saying he is going home. Bleeding controlled

## 2020-11-09 NOTE — ED Notes (Signed)
Pt out to nurses station multiple times with different requests, pt informed that when the doctor gets a moment to review his results he will come in an update him, pt returned back to room

## 2020-11-09 NOTE — ED Notes (Signed)
Patient left without discharge instructions and without obtaining vital signs. Patient removed his IV before leaving.

## 2020-11-09 NOTE — Discharge Instructions (Addendum)
At this time there does not appear to be the presence of an emergent medical condition, however there is always the potential for conditions to change. Please read and follow the below instructions.  Please return to the Emergency Department immediately for any new or worsening symptoms. Please be sure to follow up with your Primary Care Provider within one week regarding your visit today; please call their office to schedule an appointment even if you are feeling better for a follow-up visit. You have been referred to the cardiology group today, please call their office today to schedule your follow-up appointment. Please stop smoking as this raises your risk of many diseases.  Also your CT scan today showed some pulmonary nodules which could be concerning for cancer.  Please discuss this with your primary care provider at your follow-up visit this week you will need a follow-up picture of your chest to see if these worsen. Your CT scan today also showed a small hiatal hernia, discussed this with your primary care provider your follow-up ointment. Please take the medication Protonix as prescribed to help with acid reflux related symptoms.  Go to the nearest Emergency Department immediately if: You have fever or chills You have a cough that gets worse, or you cough up blood. You have very bad (severe) pain in your belly (abdomen). You pass out (faint). You have either of these for no clear reason: Sudden chest discomfort. Sudden discomfort in your arms, back, neck, or jaw. You have shortness of breath at any time. You suddenly start to sweat, or your skin gets clammy. You feel sick to your stomach (nauseous). You throw up (vomit). You suddenly feel lightheaded or dizzy. You feel very weak or tired. Your heart starts to beat fast, or it feels like it is skipping beats. You have any new/concerning or worsening of symptoms   Please read the additional information packets attached to your  discharge summary.  Do not take your medicine if  develop an itchy rash, swelling in your mouth or lips, or difficulty breathing; call 911 and seek immediate emergency medical attention if this occurs.  You may review your lab tests and imaging results in their entirety on your MyChart account.  Please discuss all results of fully with your primary care provider and other specialist at your follow-up visit.  Note: Portions of this text may have been transcribed using voice recognition software. Every effort was made to ensure accuracy; however, inadvertent computerized transcription errors may still be present.

## 2020-11-18 ENCOUNTER — Encounter: Payer: Self-pay | Admitting: Internal Medicine

## 2020-11-18 ENCOUNTER — Other Ambulatory Visit: Payer: Self-pay

## 2020-11-18 ENCOUNTER — Ambulatory Visit (INDEPENDENT_AMBULATORY_CARE_PROVIDER_SITE_OTHER): Payer: BLUE CROSS/BLUE SHIELD | Admitting: Internal Medicine

## 2020-11-18 VITALS — BP 116/82 | HR 98 | Ht 68.0 in | Wt 166.6 lb

## 2020-11-18 DIAGNOSIS — E785 Hyperlipidemia, unspecified: Secondary | ICD-10-CM

## 2020-11-18 DIAGNOSIS — I361 Nonrheumatic tricuspid (valve) insufficiency: Secondary | ICD-10-CM | POA: Diagnosis not present

## 2020-11-18 DIAGNOSIS — F17200 Nicotine dependence, unspecified, uncomplicated: Secondary | ICD-10-CM

## 2020-11-18 DIAGNOSIS — F1910 Other psychoactive substance abuse, uncomplicated: Secondary | ICD-10-CM

## 2020-11-18 DIAGNOSIS — M6282 Rhabdomyolysis: Secondary | ICD-10-CM | POA: Diagnosis not present

## 2020-11-18 DIAGNOSIS — F1410 Cocaine abuse, uncomplicated: Secondary | ICD-10-CM

## 2020-11-18 LAB — VITAMIN B1: Vitamin B1 (Thiamine): 112.4 nmol/L (ref 66.5–200.0)

## 2020-11-18 NOTE — Patient Instructions (Signed)
Medication Instructions:  Your physician recommends that you continue on your current medications as directed. Please refer to the Current Medication list given to you today.  *If you need a refill on your cardiac medications before your next appointment, please call your pharmacy*   Lab Work: TODAY: urine drug screen IN 4 MONTHS: fasting lipid panel  If you have labs (blood work) drawn today and your tests are completely normal, you will receive your results only by: Marland Kitchen MyChart Message (if you have MyChart) OR . A paper copy in the mail If you have any lab test that is abnormal or we need to change your treatment, we will call you to review the results.   Testing/Procedures: Your physician has requested that you have an echocardiogram. Echocardiography is a painless test that uses sound waves to create images of your heart. It provides your doctor with information about the size and shape of your heart and how well your heart's chambers and valves are working. This procedure takes approximately one hour. There are no restrictions for this procedure.     Follow-Up: At [facility], you and your health needs are our priority.  As part of our continuing mission to provide you with exceptional heart care, we have created designated Provider Care Teams.  These Care Teams include your primary Cardiologist (physician) and Advanced Practice Providers (APPs -  Physician Assistants and Nurse Practitioners) who all work together to provide you with the care you need, when you need it.  We recommend signing up for the patient portal called "MyChart".  Sign up information is provided on this After Visit Summary.  MyChart is used to connect with patients for Virtual Visits (Telemedicine).  Patients are able to view lab/test results, encounter notes, upcoming appointments, etc.  Non-urgent messages can be sent to your provider as well.   To learn more about what you can do with MyChart, go to  [email or web address].    Your next appointment:   4 month(s)  The format for your next appointment:   In Person  Provider:   You may see Gasper Sells, MD or one of the following Advanced Practice Providers on your designated Care Team:    Melina Copa, PA-C  Ermalinda Barrios, PA-C
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
## 2020-11-22 ENCOUNTER — Ambulatory Visit (HOSPITAL_COMMUNITY): Payer: BLUE CROSS/BLUE SHIELD | Attending: Cardiovascular Disease

## 2020-11-22 ENCOUNTER — Other Ambulatory Visit: Payer: Self-pay

## 2020-11-22 DIAGNOSIS — I361 Nonrheumatic tricuspid (valve) insufficiency: Secondary | ICD-10-CM | POA: Diagnosis present

## 2020-11-22 LAB — ECHOCARDIOGRAM COMPLETE
Area-P 1/2: 3.48 cm2
S' Lateral: 2.4 cm

## 2020-11-29 ENCOUNTER — Telehealth: Payer: Self-pay

## 2020-11-29 NOTE — Telephone Encounter (Signed)
Attempted to call patient results message left to call the office.

## 2020-11-29 NOTE — Telephone Encounter (Signed)
-----   Message from Werner Lean, MD sent at 11/23/2020  9:04 AM EST ----- Results: Improvement in TR no WMA Plan: No change in medications  Werner Lean, MD

## 2020-12-19 ENCOUNTER — Other Ambulatory Visit: Payer: BLUE CROSS/BLUE SHIELD | Admitting: *Deleted

## 2020-12-19 ENCOUNTER — Other Ambulatory Visit: Payer: Self-pay

## 2020-12-19 DIAGNOSIS — M6282 Rhabdomyolysis: Secondary | ICD-10-CM

## 2020-12-20 LAB — LIPID PANEL
Chol/HDL Ratio: 3.4 ratio (ref 0.0–5.0)
Cholesterol, Total: 153 mg/dL (ref 100–199)
HDL: 45 mg/dL (ref >39)
LDL Chol Calc (NIH): 92 mg/dL (ref 0–99)
Triglycerides: 83 mg/dL (ref 0–149)
VLDL Cholesterol Cal: 16 mg/dL (ref 5–40)

## 2021-01-03 ENCOUNTER — Emergency Department (HOSPITAL_BASED_OUTPATIENT_CLINIC_OR_DEPARTMENT_OTHER): Payer: BLUE CROSS/BLUE SHIELD

## 2021-01-03 ENCOUNTER — Other Ambulatory Visit: Payer: Self-pay

## 2021-01-03 ENCOUNTER — Emergency Department (HOSPITAL_BASED_OUTPATIENT_CLINIC_OR_DEPARTMENT_OTHER)
Admission: EM | Admit: 2021-01-03 | Discharge: 2021-01-03 | Disposition: A | Discharge status: Home / Self Care | Payer: BLUE CROSS/BLUE SHIELD | Attending: Emergency Medicine | Admitting: Emergency Medicine

## 2021-01-03 ENCOUNTER — Encounter (HOSPITAL_BASED_OUTPATIENT_CLINIC_OR_DEPARTMENT_OTHER): Payer: Self-pay | Admitting: Emergency Medicine

## 2021-01-03 DIAGNOSIS — I10 Essential (primary) hypertension: Secondary | ICD-10-CM | POA: Diagnosis not present

## 2021-01-03 DIAGNOSIS — M546 Pain in thoracic spine: Secondary | ICD-10-CM | POA: Insufficient documentation

## 2021-01-03 DIAGNOSIS — R079 Chest pain, unspecified: Secondary | ICD-10-CM | POA: Diagnosis not present

## 2021-01-03 DIAGNOSIS — F17210 Nicotine dependence, cigarettes, uncomplicated: Secondary | ICD-10-CM | POA: Insufficient documentation

## 2021-01-03 DIAGNOSIS — Z79899 Other long term (current) drug therapy: Secondary | ICD-10-CM | POA: Insufficient documentation

## 2021-01-03 DIAGNOSIS — M549 Dorsalgia, unspecified: Secondary | ICD-10-CM

## 2021-01-03 DIAGNOSIS — R0602 Shortness of breath: Secondary | ICD-10-CM | POA: Insufficient documentation

## 2021-01-03 LAB — CBC WITH DIFFERENTIAL/PLATELET
Abs Immature Granulocytes: 0.02 10*3/uL (ref 0.00–0.07)
Basophils Absolute: 0.1 10*3/uL (ref 0.0–0.1)
Basophils Relative: 1 %
Eosinophils Absolute: 0.2 10*3/uL (ref 0.0–0.5)
Eosinophils Relative: 2 %
HCT: 42.4 % (ref 39.0–52.0)
Hemoglobin: 14.4 g/dL (ref 13.0–17.0)
Immature Granulocytes: 0 %
Lymphocytes Relative: 27 %
Lymphs Abs: 2.5 10*3/uL (ref 0.7–4.0)
MCH: 30.9 pg (ref 26.0–34.0)
MCHC: 34 g/dL (ref 30.0–36.0)
MCV: 91 fL (ref 80.0–100.0)
Monocytes Absolute: 0.7 10*3/uL (ref 0.1–1.0)
Monocytes Relative: 7 %
Neutro Abs: 5.8 10*3/uL (ref 1.7–7.7)
Neutrophils Relative %: 63 %
Platelets: 448 10*3/uL — ABNORMAL HIGH (ref 150–400)
RBC: 4.66 MIL/uL (ref 4.22–5.81)
RDW: 12.4 % (ref 11.5–15.5)
WBC: 9.2 10*3/uL (ref 4.0–10.5)
nRBC: 0 % (ref 0.0–0.2)

## 2021-01-03 LAB — BASIC METABOLIC PANEL
Anion gap: 8 (ref 5–15)
BUN: 9 mg/dL (ref 6–20)
CO2: 27 mmol/L (ref 22–32)
Calcium: 9.4 mg/dL (ref 8.9–10.3)
Chloride: 102 mmol/L (ref 98–111)
Creatinine, Ser: 0.84 mg/dL (ref 0.61–1.24)
GFR, Estimated: >60 mL/min (ref >60)
Glucose, Bld: 100 mg/dL — ABNORMAL HIGH (ref 70–99)
Potassium: 4 mmol/L (ref 3.5–5.1)
Sodium: 137 mmol/L (ref 135–145)

## 2021-01-03 LAB — TROPONIN I (HIGH SENSITIVITY): Troponin I (High Sensitivity): 2 ng/L (ref <18)

## 2021-01-03 NOTE — ED Notes (Signed)
Pt reports that he does not have a pcp. Pt reports that his pcp left the office and has not been replaced and he cannot obtain his daily medications.  Pt informed that the nurse could check on that for him and obtain him a pcp office to call to obtain. Pt stated "no, I just wanna get out of here"

## 2021-01-03 NOTE — Discharge Instructions (Addendum)
You were seen in the emergency department for evaluation of chest pain and shortness of breath in the setting of a recent back injury.  He had blood work EKG and a chest x-ray that did not show any pneumonia or evidence of heart attack.  Please follow-up with your primary care doctor and your orthopedic doctor.  Return to the emergency department for any worsening or concerning symptoms

## 2021-01-03 NOTE — ED Provider Notes (Signed)
[name] EMERG[facility]Y DEPT Provider Note   CSN: [id number] Arrival date & time: 01/03/21  1628     History Chief Complaint  Patient presents with  . Back Pain    [name] is a 55 y.o. male.  He said he had a seizure about a month ago and injured his back.  He follows with Dr. Rolena Infante from [facility] and had an MRI few days ago.  He saw Dr. Rolena Infante today and they are planning on doing surgery but they were concerned that he might have pneumonia.  It is unclear if this is on the imaging or not.  He denies any cough or fever but he said he short of breath and does have some chest pain but he blames this on the pain in his back which he rates as severe.  He is on oxycodone 10 mg.  No bowel or bladder incontinence.  Back pain is worse with any movement.  The history is provided by the patient.  Back Pain Location:  Thoracic spine Quality:  Aching Pain severity:  Severe Pain is:  Same all the time Onset quality:  Gradual Duration:  1 month Timing:  Constant Progression:  Unchanged Chronicity:  New Relieved by:  Nothing Worsened by:  Bending and twisting Ineffective treatments:  Narcotics Associated symptoms: chest pain   Associated symptoms: no abdominal pain, no dysuria, no fever and no weakness   Chest pain:    Quality: aching     Severity:  Moderate   Onset quality:  Gradual   Timing:  Intermittent   Progression:  Unchanged   Chronicity:  New      Past Medical History:  Diagnosis Date  . Anxiety   . Arthritis    degeneration of spine & scoliosis  . Bipolar disorder ([place])   . Chest pain   . GERD (gastroesophageal reflux disease)   . H/O echocardiogram ?2015  . Hyperlipidemia   . Hypertension   . Pneumonia 2002   [place] admission   . Shortness of breath dyspnea   . Sleep apnea    test 3/15-waiting results-was told oxygen did drop- did additional study done at home, told that he doesn't have sleep apnea     Patient Active Problem List    Diagnosis Date Noted  . Nonrheumatic tricuspid valve regurgitation 11/18/2020  . Cocaine abuse ([place]) 11/18/2020  . Polysubstance abuse ([place]) 11/02/2020  . Rhabdomyolysis 11/02/2020  . 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 Current nicotine use 10/26/2018  . Benign neoplasm of ascending colon   . DDD (degenerative disc disease), cervical 12/17/2017  . Lumbar radiculopathy 12/17/2017  . Neck pain 08/17/2015  . Special screening for malignant neoplasms, colon 08/04/2015  . Low testosterone 05/02/2015  . Back pain 04/28/2015  . Hemorrhoid 04/28/2015  . Ganglion cyst of wrist 11/20/2013  . Testosterone deficiency 11/19/2011  . HLD (hyperlipidemia) 10/25/2010  . Bipolar disorder ([place]) 10/25/2010  . TOBACCO ABUSE 10/25/2010  . Essential hypertension 10/25/2010  . GERD 10/25/2010    Past Surgical History:  Procedure Laterality Date  . ANTERIOR CERVICAL DECOMP/DISCECTOMY FUSION N/A 08/17/2015   Procedure: ANTERIOR CERVICAL DECOMPRESSION/DISCECTOMY FUSION Cervical five-cervical seven. Two LEVELS;  Surgeon: Melina Schools, MD;  Location: [facility];  Service: Orthopedics;  Laterality: N/A;  . BIOPSY  07/15/2018   Procedure: BIOPSY;  Surgeon: Gatha Mayer, MD;  Location: [facility];  Service: Endoscopy;;  . BRONCHOSCOPY    . COLONOSCOPY WITH PROPOFOL N/A 07/15/2018   Procedure: COLONOSCOPY WITH  PROPOFOL;  Surgeon: Gatha Mayer, MD;  Location: [facility];  Service: Endoscopy;  Laterality: N/A;  . MASS EXCISION Left 12/17/2013   Procedure: EXCISION MASS LEFT WRIST;  Surgeon: Tennis Must, MD;  Location: [facility];  Service: Orthopedics;  Laterality: Left;  . SEPTOPLASTY  1990       Family History  Problem Relation Age of Onset  . Cancer Mother        melanoma  . Depression Other   . Cancer Other        skin  . Colon polyps Neg Hx   . Esophageal cancer Neg Hx   . Rectal cancer Neg Hx   . Stomach cancer Neg Hx     Social History   Tobacco Use  .  Smoking status: Current Every Day Smoker    Packs/day: 0.50    Types: Cigarettes  . Smokeless tobacco: Former Network engineer  . Vaping Use: Former  Substance Use Topics  . Alcohol use: No    Alcohol/week: 0.0 standard drinks    Comment: quit 2007  . Drug use: No    Comment: history cocaine-last 2006    Home Medications Prior to Admission medications   Medication Sig Start Date End Date Taking? Authorizing Provider  ALPRAZolam Duanne Moron) 1 MG tablet Take 1 mg by mouth 3 (three) times daily as needed for anxiety.    Yes [provider]  B-D 3CC LUER-LOK SYR 22GX1-1/2 22G X 1-1/2" 3 ML MISC  08/14/19  Yes [provider]  clonazePAM (KLONOPIN) 2 MG tablet Take 2 mg by mouth at bedtime.    Yes [provider]  diphenhydrAMINE (BENADRYL) 25 MG tablet Take 50 mg by mouth at bedtime as needed for sleep or allergies.   Yes [provider]  fluticasone (FLONASE) 50 MCG/ACT nasal spray Place 2 sprays into both nostrils daily. 04/29/19  Yes Elby Beck, FNP  lamoTRIgine (LAMICTAL) 150 MG tablet Take 150 mg by mouth at bedtime.   Yes [provider]  LATUDA 120 MG TABS Take 120 mg by mouth daily after supper.  08/30/17  Yes [provider]  lisinopril (ZESTRIL) 20 MG tablet Take 1 tablet (20 mg total) by mouth daily. 11/20/19  Yes Elby Beck, FNP  sertraline (ZOLOFT) 100 MG tablet Take 100 mg by mouth daily.   Yes [provider]  sildenafil (REVATIO) 20 MG tablet Take 2-5 tablets (40-100 mg total) by mouth daily as needed (for erectile dysfunction.). 06/02/20  Yes Elby Beck, FNP  simvastatin (ZOCOR) 40 MG tablet TAKE 1 TABLET BY MOUTH DAILY AT 6 PM. 09/12/20  Yes Elby Beck, FNP  testosterone cypionate (DEPOTESTOTERONE CYPIONATE) 100 MG/ML injection Inject 100 mg into the muscle once a week. 11/19/19  Yes [provider]  zolpidem (AMBIEN) 10 MG tablet Take 10 mg by mouth at bedtime.  09/16/17  Yes  [provider]  albuterol (PROAIR HFA) 108 (90 Base) MCG/ACT inhaler Inhale 1-2 puffs into the lungs every 6 (six) hours as needed for wheezing or shortness of breath. 04/09/18   Elby Beck, FNP  methocarbamol (ROBAXIN) 500 MG tablet Take 1 tablet (500 mg total) by mouth 3 (three) times daily as needed for muscle spasms. 08/17/15   Melina Schools, MD  naloxone [facility]) 4 MG/0.1ML LIQD nasal spray kit Place 4 mg into the nose once. As directed    [provider]  oxyCODONE-acetaminophen (PERCOCET) 10-325 MG tablet Take 1 tablet by  mouth every 4 (four) hours as needed for pain. 08/17/15   Melina Schools, MD  pantoprazole (PROTONIX) 20 MG tablet Take 1 tablet (20 mg total) by mouth daily. 11/09/20   Nuala Alpha A, PA-C  QUEtiapine (SEROQUEL) 300 MG tablet Take 450 mg by mouth at bedtime. Taking 1 & 1/2 tabs = 450 mg    [provider]  tamsulosin (FLOMAX) 0.4 MG CAPS capsule Take 0.4 mg by mouth at bedtime.    [provider]  lansoprazole (PREVACID) 30 MG capsule TAKE 1 CAPSULE BY MOUTH 30 MINUTES BEFORE BREAKFAST AND DINNER DAILY FOR 4 WEEKS, THEN TAKE ONCE A DAY BEFORE DINNER. Patient not taking: Reported on 11/03/2020 08/19/19 11/09/20  Elby Beck, FNP    Allergies    Patient has no known allergies.  Review of Systems   Review of Systems  Constitutional: Negative for fever.  HENT: Negative for sore throat.   Eyes: Negative for visual disturbance.  Respiratory: Positive for shortness of breath.   Cardiovascular: Positive for chest pain.  Gastrointestinal: Negative for abdominal pain.  Genitourinary: Negative for dysuria.  Musculoskeletal: Positive for back pain. Negative for neck pain.  Skin: Negative for rash.  Neurological: Negative for weakness.    Physical Exam Updated Vital Signs BP 133/88 (BP Location: Right Arm)   Pulse 95   Temp 98.2 F (36.8 C) (Oral)   Resp 11   Ht 5' 9"  (1.753 m)   Wt 77.1 kg   SpO2 98%   BMI  25.10 kg/m   Physical Exam Vitals and nursing note reviewed.  Constitutional:      Appearance: Normal appearance. He is well-developed.  HENT:     Head: Normocephalic and atraumatic.  Eyes:     Conjunctiva/sclera: Conjunctivae normal.  Cardiovascular:     Rate and Rhythm: Normal rate and regular rhythm.     Heart sounds: No murmur heard.   Pulmonary:     Effort: Pulmonary effort is normal. No respiratory distress.     Breath sounds: Normal breath sounds.  Abdominal:     Palpations: Abdomen is soft.     Tenderness: There is no abdominal tenderness. There is no guarding or rebound.  Musculoskeletal:        General: Tenderness present.     Cervical back: Neck supple.     Right lower leg: No edema.     Left lower leg: No edema.     Comments: Thoracic back  Skin:    General: Skin is warm and dry.  Neurological:     General: No focal deficit present.     Mental Status: He is alert.     ED Results / Procedures / Treatments   Labs (all labs ordered are listed, but only abnormal results are displayed) Labs Reviewed  BASIC METABOLIC PANEL - Abnormal; Notable for the following components:      Result Value   Glucose, Bld 100 (*)    All other components within normal limits  CBC WITH DIFFERENTIAL/PLATELET - Abnormal; Notable for the following components:   Platelets 448 (*)    All other components within normal limits  TROPONIN I (HIGH SENSITIVITY)  TROPONIN I (HIGH SENSITIVITY)    EKG EKG Interpretation  Date/Time:  Tuesday January 03 2021 16:36:46 EDT Ventricular Rate:  92 PR Interval:  98 QRS Duration: 95 QT Interval:  336 QTC Calculation: 416 R Axis:   80 Text Interpretation: Sinus rhythm Short PR interval RSR' in V1 or V2, probably normal variant ST elev,  probable normal early repol pattern Baseline wander in lead(s) V3 No significant change since prior 2/22 Confirmed by Aletta Edouard [phone]) on 01/03/2021 4:42:26 PM   Radiology DG Chest Port 1 View  Result  Date: 01/03/2021 CLINICAL DATA:  Chest pain and shortness of breath.  Smoker. EXAM: PORTABLE CHEST 1 VIEW COMPARISON:  11/08/2020 FINDINGS: Normal sized heart. Clear lungs. The lungs remain hyperexpanded. Cervical spine fixation hardware. IMPRESSION: 1. No acute abnormality. 2. COPD. Electronically Signed   By: Claudie Revering M.D.   On: 01/03/2021 17:14    Procedures Procedures   Medications Ordered in ED Medications - No data to display  ED Course  I have reviewed the triage vital signs and the nursing notes.  Pertinent labs & imaging results that were available during my care of the patient were reviewed by me and considered in my medical decision making (see chart for details).  Clinical Course as of 01/04/21 0941  Tue Jan 03, 2021  1719 Patient's chest x-ray shows signs of COPD but no acute infiltrates. [MB]  4037 I am unable to see patient's MRI results from Saturday.  Unclear if there was something on those imaging that made Dr. Rolena Infante suspicious for pneumonia. [MB]  0964 No evidence of pneumonia and normal white count normal pulse ox.  No indications for antibiotics.  Reviewed with patient he is comfortable plan for discharge. [MB]    Clinical Course User Index [MB] Hayden Rasmussen, MD   MDM Rules/Calculators/A&P                         This patient complains of thoracic back pain, shortness of breath, chest pain; this involves an extensive number of treatment Options and is a complaint that carries with it a high risk of complications and Morbidity. The differential includes musculoskeletal pain, pneumothorax, pneumonia, COPD exacerbation, ACS, vascular  I ordered, reviewed and interpreted labs, which included CBC with normal white count, normal hemoglobin, chemistries normal, troponin unremarkable not elevated I ordered imaging studies which included chest x-ray  and I independently    visualized and interpreted imaging which showed no evidence of pneumonia Previous records  obtained and reviewed in epic, no recent admissions  After the interventions stated above, I reevaluated the patient and found patient still to be complaining of his back pain.  I did offer pain medication but he is driving so he is waiting for completion of work-up and take his pain medication at home.  No see any evidence of pneumonia clinically or with his testing.  Recommended close follow-up with his primary care doctor and orthopedics.  Return instructions discussed.   Final Clinical Impression(s) / ED Diagnoses Final diagnoses:  Upper back pain  Nonspecific chest pain    Rx / DC Orders ED Discharge Orders    None       Hayden Rasmussen, MD 01/04/21 [phone]

## 2021-01-03 NOTE — ED Triage Notes (Signed)
Hurts to breath because of his back  ( FROM A SZ A WHILE BACK ) he states   X 1 month  Denies n/v

## 2021-01-03 NOTE — ED Notes (Signed)
PT REMOVED  HIS OWN iv  STATING  He was ready to go home

## 2021-01-04 ENCOUNTER — Other Ambulatory Visit (HOSPITAL_COMMUNITY): Payer: Self-pay | Admitting: Orthopedic Surgery

## 2021-01-04 ENCOUNTER — Ambulatory Visit
Admission: RE | Admit: 2021-01-04 | Discharge: 2021-01-04 | Disposition: A | Discharge status: Home / Self Care | Payer: Self-pay | Source: Ambulatory Visit | Attending: Orthopedic Surgery | Admitting: Orthopedic Surgery

## 2021-01-04 DIAGNOSIS — R52 Pain, unspecified: Secondary | ICD-10-CM

## 2021-01-05 ENCOUNTER — Other Ambulatory Visit: Payer: Self-pay | Admitting: Orthopedic Surgery

## 2021-01-05 DIAGNOSIS — S22000A Wedge compression fracture of unspecified thoracic vertebra, initial encounter for closed fracture: Secondary | ICD-10-CM

## 2021-01-10 ENCOUNTER — Telehealth: Payer: Self-pay

## 2021-01-10 DIAGNOSIS — J4520 Mild intermittent asthma, uncomplicated: Secondary | ICD-10-CM

## 2021-01-10 DIAGNOSIS — I10 Essential (primary) hypertension: Secondary | ICD-10-CM

## 2021-01-10 NOTE — Telephone Encounter (Signed)
Pharmacy requests refill on: Pantoprazole 40 mg  LAST REFILL: 11/09/2020 (Q-30, R-0) LAST OV: 11/20/2019 NEXT OV: Not Scheduled  PHARMACY: [facility] requests refill on: Proair HFA 90 mcg Inhaler   LAST REFILL: 04/09/2018 (Q-1 Inhaler, R-1) LAST OV: 11/20/2019 NEXT OV: Not Scheduled  PHARMACY: [facility] requests refill on: Lisinopril 20 mg   LAST REFILL: 11/20/2019 (Q-90, R-3) LAST OV: 11/20/2019 NEXT OV: Not Scheduled  PHARMACY: [facility]

## 2021-01-11 MED ORDER — ALBUTEROL SULFATE HFA 108 (90 BASE) MCG/ACT IN AERS: 1.0000 | INHALATION_SPRAY | Freq: Four times a day (QID) | RESPIRATORY_TRACT | 1 refills | Status: DC | PRN

## 2021-01-11 MED ORDER — LISINOPRIL 20 MG PO TABS
20.0000 mg | ORAL_TABLET | Freq: Every day | ORAL | 3 refills | Status: DC
Start: 2021-01-11 — End: 2022-05-14

## 2021-01-11 MED ORDER — PANTOPRAZOLE SODIUM 20 MG PO TBEC: 20.0000 mg | DELAYED_RELEASE_TABLET | Freq: Every day | ORAL | 0 refills | Status: AC

## 2021-01-12 ENCOUNTER — Other Ambulatory Visit: Payer: Self-pay | Admitting: Radiology

## 2021-01-12 ENCOUNTER — Other Ambulatory Visit: Payer: Self-pay | Admitting: Student

## 2021-01-13 ENCOUNTER — Other Ambulatory Visit: Payer: Self-pay | Admitting: Orthopedic Surgery

## 2021-01-13 ENCOUNTER — Encounter (HOSPITAL_COMMUNITY): Payer: Self-pay

## 2021-01-13 ENCOUNTER — Ambulatory Visit (HOSPITAL_COMMUNITY)
Admission: RE | Admit: 2021-01-13 | Discharge: 2021-01-13 | Disposition: A | Discharge status: Home / Self Care | Payer: BLUE CROSS/BLUE SHIELD | Source: Ambulatory Visit | Attending: Orthopedic Surgery | Admitting: Orthopedic Surgery

## 2021-01-13 ENCOUNTER — Other Ambulatory Visit: Payer: Self-pay

## 2021-01-13 DIAGNOSIS — S22000A Wedge compression fracture of unspecified thoracic vertebra, initial encounter for closed fracture: Secondary | ICD-10-CM

## 2021-01-13 DIAGNOSIS — Z79899 Other long term (current) drug therapy: Secondary | ICD-10-CM | POA: Diagnosis not present

## 2021-01-13 DIAGNOSIS — W19XXXS Unspecified fall, sequela: Secondary | ICD-10-CM | POA: Insufficient documentation

## 2021-01-13 DIAGNOSIS — F17210 Nicotine dependence, cigarettes, uncomplicated: Secondary | ICD-10-CM | POA: Diagnosis not present

## 2021-01-13 DIAGNOSIS — S22000S Wedge compression fracture of unspecified thoracic vertebra, sequela: Secondary | ICD-10-CM | POA: Diagnosis not present

## 2021-01-13 HISTORY — PX: IR KYPHO EA ADDL LEVEL THORACIC OR LUMBAR: IMG5520

## 2021-01-13 HISTORY — PX: IR KYPHO THORACIC WITH BONE BIOPSY: IMG5518

## 2021-01-13 LAB — POCT I-STAT, CHEM 8
BUN: 9 mg/dL (ref 6–20)
Calcium, Ion: 1.01 mmol/L — ABNORMAL LOW (ref 1.15–1.40)
Chloride: 105 mmol/L (ref 98–111)
Creatinine, Ser: 0.9 mg/dL (ref 0.61–1.24)
Glucose, Bld: 89 mg/dL (ref 70–99)
HCT: 48 % (ref 39.0–52.0)
Hemoglobin: 16.3 g/dL (ref 13.0–17.0)
Potassium: 4.5 mmol/L (ref 3.5–5.1)
Sodium: 136 mmol/L (ref 135–145)
TCO2: 22 mmol/L (ref 22–32)

## 2021-01-13 LAB — CBC
HCT: 47.4 % (ref 39.0–52.0)
Hemoglobin: 15.7 g/dL (ref 13.0–17.0)
MCH: 31.3 pg (ref 26.0–34.0)
MCHC: 33.1 g/dL (ref 30.0–36.0)
MCV: 94.6 fL (ref 80.0–100.0)
Platelets: 391 10*3/uL (ref 150–400)
RBC: 5.01 MIL/uL (ref 4.22–5.81)
RDW: 12.9 % (ref 11.5–15.5)
WBC: 8.2 10*3/uL (ref 4.0–10.5)
nRBC: 0 % (ref 0.0–0.2)

## 2021-01-13 LAB — PROTIME-INR
INR: 0.9 (ref 0.8–1.2)
Prothrombin Time: 12.6 seconds (ref 11.4–15.2)

## 2021-01-13 MED ORDER — MIDAZOLAM HCL 2 MG/2ML IJ SOLN
INTRAMUSCULAR | Status: AC
  Filled 2021-01-13: qty 2

## 2021-01-13 MED ORDER — OXYCODONE HCL 5 MG PO TABS
10.0000 mg | ORAL_TABLET | Freq: Once | ORAL | Status: AC | PRN
  Administered 2021-01-13: 10 mg via ORAL
  Filled 2021-01-13: qty 2

## 2021-01-13 MED ORDER — TOBRAMYCIN SULFATE 1.2 G IJ SOLR
INTRAMUSCULAR | Status: AC
  Filled 2021-01-13: qty 1.2

## 2021-01-13 MED ORDER — FENTANYL CITRATE (PF) 100 MCG/2ML IJ SOLN
INTRAMUSCULAR | Status: AC
  Filled 2021-01-13: qty 2

## 2021-01-13 MED ORDER — SODIUM CHLORIDE 0.9 % IV SOLN
INTRAVENOUS | Status: AC | PRN
  Administered 2021-01-13: 10 mL/h via INTRAVENOUS

## 2021-01-13 MED ORDER — SODIUM CHLORIDE 0.9 % IV SOLN: INTRAVENOUS | Status: AC

## 2021-01-13 MED ORDER — CEFAZOLIN SODIUM-DEXTROSE 2-4 GM/100ML-% IV SOLN: 2.0000 g | INTRAVENOUS | Status: AC

## 2021-01-13 MED ORDER — FENTANYL CITRATE (PF) 100 MCG/2ML IJ SOLN
INTRAMUSCULAR | Status: AC | PRN
  Administered 2021-01-13 (×5): 25 ug via INTRAVENOUS

## 2021-01-13 MED ORDER — SODIUM CHLORIDE 0.9 % IV SOLN: INTRAVENOUS | Status: DC

## 2021-01-13 MED ORDER — BUPIVACAINE HCL 0.25 % IJ SOLN
INTRAMUSCULAR | Status: AC | PRN
  Administered 2021-01-13: 15 mL

## 2021-01-13 MED ORDER — HYDROMORPHONE HCL 1 MG/ML IJ SOLN
INTRAMUSCULAR | Status: AC | PRN
  Administered 2021-01-13: 1 mg via INTRAVENOUS

## 2021-01-13 MED ORDER — HYDROMORPHONE HCL 1 MG/ML IJ SOLN
INTRAMUSCULAR | Status: AC
  Filled 2021-01-13: qty 1

## 2021-01-13 MED ORDER — MIDAZOLAM HCL 2 MG/2ML IJ SOLN
INTRAMUSCULAR | Status: AC | PRN
  Administered 2021-01-13 (×3): 1 mg via INTRAVENOUS

## 2021-01-13 MED ORDER — BUPIVACAINE HCL (PF) 0.5 % IJ SOLN
INTRAMUSCULAR | Status: AC
  Filled 2021-01-13: qty 30

## 2021-01-13 MED ORDER — IOHEXOL 300 MG/ML  SOLN
50.0000 mL | Freq: Once | INTRAMUSCULAR | Status: AC | PRN
  Administered 2021-01-13: 50 mL

## 2021-01-13 MED ORDER — CEFAZOLIN SODIUM-DEXTROSE 2-4 GM/100ML-% IV SOLN
INTRAVENOUS | Status: AC
  Administered 2021-01-13: 2 g via INTRAVENOUS
  Filled 2021-01-13: qty 100

## 2021-01-13 NOTE — Consult Note (Signed)
Chief Complaint: Patient was seen in consultation today for thoracic 5/6 vertebroplasty/kyphoplasty with moderate sedation.  Referring Physician(s): [name]  Supervising Physician: [name]  Patient Status: [facility] - Out-pt  History of Present Illness: [name] is a 55 y.o. male with a past medical history significant for anxiety, bipolar disorder, GERD, HTN, HLD and previous cervical spinal fusion who presents today for a thoracic 5/6 vertebroplasty/kyphoplasty with moderate sedation. 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 He has had back pain between his shoulder blades ever since. He was seen by orthopedics who referred him for imaging which showed acute compression fractures at thoracic 5/6. He has been referred to IR for vertebroplasty/kyphoplasty.  [name] reports ongoing pain in his neck, mid back and lower back - the pain in his neck and low back are chronic and unchanged since the seizure, however the mid back pain is new and most bothersome. The pain is present all the time and is helped minimally by applying a heating pad. He takes oxycontin 4 times per day which does not provide relief. He has some numbness in his left hand which started around the same time as his fall. He does not use assistance for walking and is able to complete all ADLs independently. He understands the procedure today and is agreeable to proceed as planned.  Past Medical History:  Diagnosis Date  . Anxiety   . Arthritis    degeneration of spine & scoliosis  . Bipolar disorder ([place])   . Chest pain   . GERD (gastroesophageal reflux disease)   . H/O echocardiogram ?2015  . Hyperlipidemia   . Hypertension   . Pneumonia 2002   [place] admission   . Shortness of breath dyspnea   . Sleep apnea    test 3/15-waiting results-was told oxygen did  drop- did additional study done at home, told that he doesn't have sleep apnea     Past Surgical History:  Procedure Laterality Date  . ANTERIOR CERVICAL DECOMP/DISCECTOMY FUSION Carroll/A 08/17/2015   Procedure: ANTERIOR CERVICAL DECOMPRESSION/DISCECTOMY FUSION Cervical five-cervical seven. Two LEVELS;  Surgeon: Edward Schools, MD;  Location: [place];  Service: Orthopedics;  Laterality: Carroll/A;  . BIOPSY  07/15/2018   Procedure: BIOPSY;  Surgeon: Edward Mayer, MD;  Location: [facility];  Service: Endoscopy;;  . BRONCHOSCOPY    . COLONOSCOPY WITH PROPOFOL Carroll/A 07/15/2018   Procedure: COLONOSCOPY WITH PROPOFOL;  Surgeon: Edward Mayer, MD;  Location: [facility];  Service: Endoscopy;  Laterality: Carroll/A;  . MASS EXCISION Left 12/17/2013   Procedure: EXCISION MASS LEFT WRIST;  Surgeon: Edward Must, MD;  Location: [place];  Service: Orthopedics;  Laterality: Left;  . SEPTOPLASTY  1990    Allergies: Patient has no known allergies.  Medications: Prior to Admission medications   Medication Sig Start Date End Date Taking? Authorizing Provider  albuterol (PROAIR HFA) 108 (90 Base) MCG/ACT inhaler Inhale 1-2 puffs into the lungs every 6 (six) hours as needed for wheezing or shortness of breath. 01/11/21  Yes Edward Quint B, FNP  ALPRAZolam [name]) 1 MG tablet Take 1 mg by mouth 3 (three) times daily.   Yes [provider]  clonazePAM (KLONOPIN) 2 MG tablet Take 2 mg by mouth at bedtime.    Yes [provider]  diphenhydrAMINE (BENADRYL) 25 MG tablet Take 50 mg by  mouth at bedtime as needed for sleep or allergies.   Yes [provider]  fluticasone (FLONASE) 50 MCG/ACT nasal spray Place 2 sprays into both nostrils daily. 04/29/19  Yes Edward Beck, FNP  Fluticasone-Salmeterol (ADVAIR) 250-50 MCG/DOSE AEPB Inhale 1 puff into the lungs 2 (two) times daily.   Yes [provider]  gabapentin (NEURONTIN) 100 MG capsule Take 100 mg by mouth See admin  instructions. Take 100 mg 3 times daily, may take an additional 100 mg dose as bedtime as needed for pain 12/27/20  Yes [provider]  lamoTRIgine (LAMICTAL) 150 MG tablet Take 150 mg by mouth at bedtime.   Yes [provider]  LATUDA 120 MG TABS Take 120 mg by mouth daily after supper.  08/30/17  Yes [provider]  lisinopril (ZESTRIL) 20 MG tablet Take 1 tablet (20 mg total) by mouth daily. 01/11/21  Yes Edward Quint B, FNP  methocarbamol (ROBAXIN) 500 MG tablet Take 1 tablet (500 mg total) by mouth 3 (three) times daily as needed for muscle spasms. 08/17/15  Yes Edward Schools, MD  naloxone [facility]) 4 MG/0.1ML LIQD nasal spray kit Place 4 mg into the nose as needed (opioid overdose). As directed   Yes [provider]  neomycin-polymyxin Carroll-dexamethasone (MAXITROL) 3.5-10000-0.1 SUSP Place 1 drop into the right eye 4 (four) times daily. 01/09/21  Yes [provider]  oxyCODONE-acetaminophen (PERCOCET) 10-325 MG tablet Take 1 tablet by mouth every 4 (four) hours as needed for pain. 08/17/15  Yes Edward Schools, MD  pantoprazole (PROTONIX) 20 MG tablet Take 1 tablet (20 mg total) by mouth daily. 01/11/21  Yes Edward Quint B, FNP  QUEtiapine (SEROQUEL) 300 MG tablet Take 450 mg by mouth at bedtime. Taking 1 & 1/2 tabs = 450 mg   Yes [provider]  sertraline (ZOLOFT) 100 MG tablet Take 100 mg by mouth daily.   Yes [provider]  sildenafil (REVATIO) 20 MG tablet Take 2-5 tablets (40-100 mg total) by mouth daily as needed (for erectile dysfunction.). 06/02/20  Yes Edward Beck, FNP  simvastatin (ZOCOR) 40 MG tablet TAKE 1 TABLET BY MOUTH DAILY AT 6 PM. Patient taking differently: Take 40 mg by mouth daily at 6 PM. 09/12/20  Yes Edward Beck, FNP  tamsulosin (FLOMAX) 0.4 MG CAPS capsule Take 0.4 mg by mouth at bedtime.   Yes [provider]  testosterone cypionate (DEPOTESTOTERONE CYPIONATE) 100 MG/ML injection Inject  100 mg into the muscle once a week. 11/19/19  Yes [provider]  zolpidem (AMBIEN) 10 MG tablet Take 10 mg by mouth at bedtime.  09/16/17  Yes [provider]  Carroll-D 3CC LUER-LOK SYR 22GX1-1/2 22G X 1-1/2" 3 ML MISC  08/14/19   [provider]  calcitonin, salmon, (MIACALCIN/FORTICAL) 200 UNIT/ACT nasal spray Place 1 spray into alternate nostrils daily. 12/21/20   [provider]  lansoprazole (PREVACID) 30 MG capsule TAKE 1 CAPSULE BY MOUTH 30 MINUTES BEFORE BREAKFAST AND DINNER DAILY FOR 4 WEEKS, THEN TAKE ONCE A DAY BEFORE DINNER. Patient not taking: Reported on 11/03/2020 08/19/19 11/09/20  Edward Beck, FNP     Family History  Problem Relation Age of Onset  . Cancer Mother        melanoma  . Depression Other   . Cancer Other        skin  . Colon polyps Neg Hx   . Esophageal cancer Neg Hx   . Rectal cancer Neg Hx   .  Stomach cancer Neg Hx     Social History   Socioeconomic History  . Marital status: Married    Spouse name: Not on file  . Number of children: Not on file  . Years of education: Not on file  . Highest education level: Not on file  Occupational History    Employer: SELF EMPLOYED  Tobacco Use  . Smoking status: Current Every Day Smoker    Packs/day: 0.50    Types: Cigarettes  . Smokeless tobacco: Former Network engineer  . Vaping Use: Former  Substance and Sexual Activity  . Alcohol use: No    Alcohol/week: 0.0 standard drinks    Comment: quit 2007  . Drug use: No    Comment: history cocaine-last 2006  . Sexual activity: Yes  Other Topics Concern  . Not on file  Social History Narrative   Married, self-employed   Vapes   No EtOH since 2007, no drug use   Social Determinants of Radio broadcast assistant Strain: Not on file  Food Insecurity: Not on file  Transportation Needs: Not on file  Physical Activity: Not on file  Stress: Not on file  Social Connections: Not on file     Review of Systems: A 12  point ROS discussed and pertinent positives are indicated in the HPI above.  All other systems are negative.  Review of Systems  Constitutional: Negative for chills and fever.  Respiratory: Negative for cough and shortness of breath.   Cardiovascular: Negative for chest pain.  Gastrointestinal: Negative for abdominal pain, nausea and vomiting.  Musculoskeletal: Positive for back pain and neck pain.  Skin: Negative for wound.  Neurological: Positive for numbness (left hand). Negative for dizziness, tremors and headaches.    Vital Signs: BP 115/78   Pulse 89   Temp 98.3 F (36.8 C) (Oral)   Resp 16   Ht 5' 9"  (1.753 m)   Wt 174 lb (78.9 kg)   SpO2 99%   BMI 25.70 kg/m   Physical Exam Vitals reviewed.  Constitutional:      General: He is not in acute distress. HENT:     Head: Normocephalic.     Mouth/Throat:     Mouth: Mucous membranes are moist.     Pharynx: Oropharynx is clear. No oropharyngeal exudate.  Cardiovascular:     Rate and Rhythm: Normal rate and regular rhythm.  Pulmonary:     Effort: Pulmonary effort is normal.     Breath sounds: Normal breath sounds.  Abdominal:     General: There is no distension.     Palpations: Abdomen is soft.     Tenderness: There is no abdominal tenderness.  Musculoskeletal:        General: Tenderness (mid back between shoulder blades TTP) present.     Cervical back: Tenderness (lower cervical spine area) present.  Skin:    General: Skin is warm.     Findings: No rash.  Neurological:     Mental Status: He is alert and oriented to person, place, and time.  Psychiatric:        Mood and Affect: Mood normal.        Behavior: Behavior normal.        Thought Content: Thought content normal.        Judgment: Judgment normal.      MD Evaluation Airway: WNL Heart: WNL Abdomen: WNL Chest/ Lungs: WNL ASA  Classification: 2 Mallampati/Airway Score: One   Imaging: DG Chest [facility] 1 [address]  Result Date: 01/03/2021 CLINICAL DATA:   Chest pain and shortness of breath.  Smoker. EXAM: PORTABLE CHEST 1 VIEW COMPARISON:  11/08/2020 FINDINGS: Normal sized heart. Clear lungs. The lungs remain hyperexpanded. Cervical spine fixation hardware. IMPRESSION: 1. No acute abnormality. 2. COPD. Electronically Signed   By: Claudie Revering M.D.   On: 01/03/2021 17:14    Labs:  CBC: Recent Labs    11/03/20 0256 11/08/20 2220 01/03/21 1649 01/13/21 0637 01/13/21 0702  WBC 12.0* 11.0* 9.2 8.2  --   HGB 13.9 15.1 14.4 15.7 16.3  HCT 40.3 44.9 42.4 47.4 48.0  PLT 245 391 448* 391  --     COAGS: Recent Labs    11/02/20 1751  INR 1.1  APTT 32    BMP: Recent Labs    11/04/20 0130 11/05/20 0119 11/08/20 2220 01/03/21 1649 01/13/21 0702  NA 138 139 133* 137 136  K 3.7 3.8 4.2 4.0 4.5  CL 106 106 97* 102 105  CO2 24 25 27 27   --   GLUCOSE 86 78 90 100* 89  BUN 9 9 8 9 9   CALCIUM 8.3* 8.6* 9.2 9.4  --   CREATININE 0.76 0.74 0.98 0.84 0.90  GFRNONAA >60 >60 NOT CALCULATED >60  --     LIVER FUNCTION TESTS: Recent Labs    11/02/20 1751 11/03/20 0815 11/04/20 0130 11/05/20 0119  BILITOT 1.0 0.6 0.6 0.5  AST 239* 168* 164* 147*  ALT 76* 59* 62* 66*  ALKPHOS 59 44 42 45  PROT 7.1 5.2* 5.1* 5.2*  ALBUMIN 4.1 3.0* 2.8* 2.9*    TUMOR MARKERS: No results for input(s): AFPTM, CEA, CA199, CHROMGRNA in the last 8760 hours.  Assessment and Plan:  55 y/o M who sustained T5/6 compression fractures in February of this year after having a seizure on concrete. His pain has not been relieved by conservative measures and he has been referred to IR for vertebroplasty/kyphoplasty. Patient history and imaging reviewed by Dr. Estanislado Pandy who agrees to procedure.  Patient has been NPO since midnight, no current anticoagulation/antiplatelet medications. Afebrile, WBC 8.2, hgb 15.7, plt 391, INR pending.  Risks and benefits of thoracic 5/6 kyphoplasty/vertebroplasty were discussed with the patient including, but not limited to education  regarding the natural healing process of compression fractures without intervention, bleeding, infection, cement migration which may cause spinal cord damage, paralysis, pulmonary embolism or even death.  This interventional procedure involves the use of X-rays and because of the nature of the planned procedure, it is possible that we will have prolonged use of X-ray fluoroscopy. Potential radiation risks to you include (but are not limited to) the following: - A slightly elevated risk for cancer  several years later in life. This risk is typically less than 0.5% percent. This risk is low in comparison to the normal incidence of human cancer, which is 33% for women and 50% for men according to the [place]. - Radiation induced injury can include skin redness, resembling a rash, tissue breakdown / ulcers and hair loss (which can be temporary or permanent).  The likelihood of either of these occurring depends on the difficulty of the procedure and whether you are sensitive to radiation due to previous procedures, disease, or genetic conditions.  IF your procedure requires a prolonged use of radiation, you will be notified and given written instructions for further action.  It is your responsibility to monitor the irradiated area for the 2 weeks following the procedure and to notify your physician if  you are concerned that you have suffered a radiation induced injury.    All of the patient's questions were answered, patient is agreeable to proceed.  Consent signed and in chart.  Thank you for this interesting consult.  I greatly enjoyed meeting [facility] and look forward to participating in their care.  A copy of this report was sent to the requesting provider on this date.  Electronically Signed: Joaquim Nam, PA-C 01/13/2021, 7:46 AM   I spent a total of 30 Minutes   in face to face in clinical consultation, greater than 50% of which was counseling/coordinating care for  T5/6 KP/VP.

## 2021-01-13 NOTE — Procedures (Signed)
S/P T5 and T6 balloon KP . S.Alvon Nygaard MD

## 2021-01-13 NOTE — Discharge Instructions (Addendum)
Percutaneous Vertebroplasty, Care After This sheet gives you information about how to care for yourself after your procedure. Your doctor may also give you more specific instructions. If you have problems or questions, contact your doctor. What can I expect after the procedure? After the procedure, it is common to have discomfort in your back at the site of the procedure. The back pain that you had before the procedure may get better right away or over the next couple of days. Follow these instructions at home: Incision care  Follow instructions from your doctor about how to take care of your incision. Make sure you: ? Wash your hands with soap and water for at least 20 seconds before and after you change your bandage (dressing). If you cannot use soap and water, use hand sanitizer. ? Change your bandage as told.  Check your incision area every day for signs of infection. Check for: ? Redness, swelling, or pain. ? Fluid or blood. ? Warmth. ? Pus or a bad smell.  Keep the bandage dry as told. Do not shower or bathe until your doctor says it is okay.   Managing pain, stiffness, and swelling If told, put ice on the affected area. To do this:  Put ice in a plastic bag.  Place a towel between your skin and bag.  Leave the ice on for 20 minutes, 2-3 times a day.  Take off the ice if your skin turns bright red. This is very important. If you cannot feel pain, heat, or cold, you have a greater risk of damage to the area.   Activity  Rest for 24 hours after the procedure or as told by your doctor.  Do not bend your back or lift anything that is heavier than 10 lb (4.5 kg). Follow your doctor's instructions about bending and lifting.  Slowly return to your normal activities when your doctor says that it is safe.  Ask what type of stretching and strengthening exercises you should do. Medicines  Take over-the-counter and prescription medicines only as told by your doctor.  Ask your  doctor if you should avoid driving or using machines while you are taking your medicine. General instructions  If you were given a sedative during your procedure, do not drive or use machines until your doctor says that it is safe. A sedative is a medicine that helps you relax.  If told, take steps to prevent problems with pooping (constipation). You may need to: ? Drink enough fluid to keep your pee (urine) pale yellow. ? Take medicines. You will be told what medicines to take. ? Eat foods that are high in fiber. These include beans, whole grains, and fresh fruits and vegetables. ? Limit foods that are high in fat and sugar. These include fried or sweet foods.  Keep all follow-up visits. Contact a doctor if:  You have any of these signs of infection in your incision: ? Redness, swelling, or pain. ? Fluid or blood. ? Warmth. ? Pus or a bad smell.  You have a fever.  You feel like you may vomit (nauseous) or you vomit for more than 24 hours.  Your back pain does not get better. Get help right away if:  You have very bad back pain that comes on all of a sudden.  You cannot control when you pee or poop (bowel movement).  You have any of these symptoms in your legs that happen all of a sudden: ? Tingling or loss of feeling (numbness). ? Weakness. ?  Pain that shoots down the legs.  You have chest pain.  You are short of breath or have trouble breathing.  You feel dizzy or you faint.  Your vision changes or you cannot talk as you normally do. These symptoms may be an emergency. Get help right away. Call your local emergency services (911 in the [place]  Do not wait to see if the symptoms will go away.  Do not drive yourself to the hospital. Summary  Rest for 24 hours after the procedure. Return slowly to normal activities when your doctor says that it is safe.  If you were given a sedative during your procedure, do not drive or use machines until your doctor says that it  is safe.  Take over-the-counter and prescription medicines only as told by your doctor. This information is not intended to replace advice given to you by your health care provider. Make sure you discuss any questions you have with your health care provider. Document Revised: 12/30/2019 Document Reviewed: 12/30/2019 Elsevier Patient Education  2021 [employer]. Balloon Kyphoplasty, Care After This sheet gives you information about how to care for yourself after your procedure. Your health care provider may also give you more specific instructions. If you have problems or questions, contact your health care provider. What can I expect after the procedure? After the procedure, it is common to have back pain. Follow these instructions at home: Medicines  Take over-the-counter and prescription medicines only as told by your health care provider.  Ask your health care provider if the medicine prescribed to you: ? Requires you to avoid driving or using machinery. ? Can cause constipation. You may need to take these actions to prevent or treat constipation:  Drink enough fluid to keep your urine pale yellow.  Take over-the-counter or prescription medicines.  Eat foods that are high in fiber, such as beans, whole grains, and fresh fruits and vegetables.  Limit foods that are high in fat and processed sugars, such as fried or sweet foods. Puncture site care  Follow instructions from your health care provider about how to take care of your puncture site. Make sure you: ? Wash your hands with soap and water for at least 20 seconds before and after you change your bandage (dressing). If soap and water are not available, use hand sanitizer. ? Change your dressing as told by your health care provider. ? Leave skin glue or adhesive strips in place. These skin closures may need to be in place for 2 weeks or longer. If adhesive strip edges start to loosen and curl up, you may trim the loose edges. Do  not remove adhesive strips completely unless your health care provider tells you to do that.  Check your puncture site every day for signs of infection. Check for: ? Redness, swelling, or pain. ? Fluid or blood. ? Warmth. ? Pus or a bad smell.  Keep your dressing dry until your health care provider says that it can be removed.   Managing pain, stiffness, and swelling If directed, put ice on the painful area. To do this:  Put ice in a plastic bag.  Place a towel between your skin and the bag.  Leave the ice on for 20 minutes, 2-3 times a day.  Remove the ice if your skin turns bright red. This is very important. If you cannot feel pain, heat, or cold, you have a greater risk of damage to the area.   Activity  Rest your back and avoid  intense physical activity for as long as told by your health care provider.  Avoid bending, lifting, or twisting your back for as long as told by your health care provider.  Return to your normal activities as told by your health care provider. Ask your health care provider what activities are safe for you.  Do not lift anything that is heavier than 5 lb (2.2 kg). You may need to avoid heavy lifting for several weeks. General instructions  Do not use any products that contain nicotine or tobacco, such as cigarettes, e-cigarettes, and chewing tobacco. These can delay bone healing. If you need help quitting, ask your health care provider.  If you were given a sedative during the procedure, it can affect you for several hours. Do not drive or operate machinery until your health care provider says that it is safe.  Keep all follow-up visits. This is important. Contact a health care provider if:  You have a fever or chills.  You have redness, swelling, or pain at the site of your puncture.  You have fluid, blood, or pus coming from the puncture site.  You have pain that gets worse or does not get better with medicine.  You develop numbness or  weakness in any part of your body. Get help right away if:  You have chest pain.  You have difficulty breathing.  You have weakness, numbness, or tingling in your legs.  You cannot control your bladder or bowel movements (incontinence).  You suddenly become weak or numb on one side of your body.  You become very confused.  You have trouble speaking or understanding, or both. These symptoms may represent a serious problem that is an emergency. Do not wait to see if the symptoms will go away. Get medical help right away. Call your local emergency services (911 in the [place] Do not drive yourself to the hospital. Summary  Follow instructions from your health care provider about how to take care of your puncture site.  Take over-the-counter and prescription medicines only as told by your health care provider.  Rest your back and avoid intense physical activity for as long as told by your health care provider.  Contact a health care provider if you have pain that gets worse or does not get better with medicine.  Keep all follow-up visits. This is important. This information is not intended to replace advice given to you by your health care provider. Make sure you discuss any questions you have with your health care provider. Document Revised: 12/30/2019 Document Reviewed: 12/30/2019 Elsevier Patient Education  2021 [employer]. Moderate Conscious Sedation, Adult Sedation is the use of medicines to promote relaxation and to relieve discomfort and anxiety. Moderate conscious sedation is a type of sedation. Under moderate conscious sedation, you are less alert than normal, but you are still able to respond to instructions, touch, or both. Moderate conscious sedation is used during short medical and dental procedures. It is milder than deep sedation, which is a type of sedation under which you cannot be easily woken up. It is also milder than general anesthesia, which is the use of  medicines to make you unconscious. Moderate conscious sedation allows you to return to your regular activities sooner. Tell a health care provider about:  Any allergies you have.  All medicines you are taking, including vitamins, herbs, eye drops, creams, and over-the-counter medicines.  Any use of steroids. This includes steroids taken by mouth or as a cream.  Any problems you  or family members have had with sedatives and anesthetic medicines.  Any blood disorders you have.  Any surgeries you have had.  Any medical conditions you have, such as sleep apnea.  Whether you are pregnant or may be pregnant.  Any use of cigarettes, alcohol, marijuana, or drugs. What are the risks? Generally, this is a safe procedure. However, problems may occur, including:  Getting too much medicine (oversedation).  Nausea.  Allergic reaction to medicines.  Trouble breathing. If this happens, a breathing tube may be used. It will be removed when you are awake and breathing on your own.  Heart trouble.  Lung trouble.  Confusion that gets better with time (emergence delirium). What happens before the procedure? Staying hydrated Follow instructions from your health care provider about hydration, which may include:  Up to 2 hours before the procedure - you may continue to drink clear liquids, such as water, clear fruit juice, black coffee, and plain tea. Eating and drinking restrictions Follow instructions from your health care provider about eating and drinking, which may include:  8 hours before the procedure - stop eating heavy meals or foods, such as meat, fried foods, or fatty foods.  6 hours before the procedure - stop eating light meals or foods, such as toast or cereal.  6 hours before the procedure - stop drinking milk or drinks that contain milk.  2 hours before the procedure - stop drinking clear liquids. Medicines Ask your health care provider about:  Changing or stopping your  regular medicines. This is especially important if you are taking diabetes medicines or blood thinners.  Taking medicines such as aspirin and ibuprofen. These medicines can thin your blood. Do not take these medicines unless your health care provider tells you to take them.  Taking over-the-counter medicines, vitamins, herbs, and supplements. Tests and exams  You will have a physical exam.  You may have blood tests done to show how well: ? Your kidneys and liver work. ? Your blood clots. General instructions  Plan to have a responsible adult take you home from the hospital or clinic.  If you will be going home right after the procedure, plan to have a responsible adult care for you for the time you are told. This is important. What happens during the procedure?  You will be given the sedative. The sedative may be given: ? As a pill that you will swallow. It can also be inserted into the rectum. ? As a spray through the nose. ? As an injection into the muscle. ? As an injection into the vein through an IV.  You may be given oxygen as needed.  Your breathing, heart rate, and blood pressure will be monitored during the procedure.  The medical or dental procedure will be done. The procedure may vary among health care providers and hospitals.   What happens after the procedure?  Your blood pressure, heart rate, breathing rate, and blood oxygen level will be monitored until you leave the hospital or clinic.  You will get fluids through your IV if needed.  Do not drive or operate machinery until your health care provider says that it is safe. Summary  Sedation is the use of medicines to promote relaxation and to relieve discomfort and anxiety. Moderate conscious sedation is a type of sedation that is used during short medical and dental procedures.  Tell the health care provider about any medical conditions that you have and about all the medicines that you are taking.  You will  be given the sedative as a pill, a spray through the nose, an injection into the muscle, or an injection into the vein through an IV. Vital signs are monitored during the sedation.  Moderate conscious sedation allows you to return to your regular activities sooner. This information is not intended to replace advice given to you by your health care provider. Make sure you discuss any questions you have with your health care provider. Document Revised: 01/08/2020 Document Reviewed: 08/06/2019 Elsevier Patient Education  2021 [employer].

## 2021-02-01 NOTE — Telephone Encounter (Signed)
Called patient to schedule. He stated he found another pcp due to [place] leaving so we will not need to refill in the future.

## 2021-03-09 NOTE — Progress Notes (Signed)
Cardiology Office Note:    Date:  03/10/2021   ID:  [name], DOB 1965-10-23, MRN [id number]  PCP:  Pa, [facility]  Cardiologist:  None Advanced Practice Provider:  No care team member to display Electrophysiologist:  None      CC: CP f/u  History of Present Illness:    [name] is a 55 y.o. male with a hx of HTN, HLD, Tobacco Abuse and polysbustance abuse including cocaine and crack (2/22) History of IV Drug Use (vinegar with crack); hx of  COVID-19 in early 2022 who presents for evaluation 11/18/20. In interim of this visit, patient had repeat LDL at is now at goal.  Patient notes that he is doing [facility] but is dealing with back pain.  Since day prior/last visit notes having back surgery.  Relevant interval testing or therapy include starting pain medication.  There are no interval hospital/ED visit.  Is cocaine free and gets drug tested by his pain management team.  No chest pain or pressure.  No SOB/DOE and no PND/Orthopnea.  No weight gain or leg swelling.  No palpitations or syncope.   Past Medical History:  Diagnosis Date   Anxiety    Arthritis    degeneration of spine & scoliosis   Bipolar disorder ([place])    Chest pain    GERD (gastroesophageal reflux disease)    H/O echocardiogram ?2015   Hyperlipidemia    Hypertension    Pneumonia 2002   [facility] admission    Shortness of breath dyspnea    Sleep apnea    test 3/15-waiting results-was told oxygen did drop- did additional study done at home, told that he doesn't have sleep apnea     Past Surgical History:  Procedure Laterality Date   ANTERIOR CERVICAL DECOMP/DISCECTOMY FUSION N/A 08/17/2015   Procedure: ANTERIOR CERVICAL DECOMPRESSION/DISCECTOMY FUSION Cervical five-cervical seven. Two LEVELS;  Surgeon: Melina Schools, MD;  Location: [place];  Service: Orthopedics;  Laterality: N/A;   BIOPSY  07/15/2018   Procedure: BIOPSY;  Surgeon: Gatha Mayer, MD;   Location: [facility];  Service: Endoscopy;;   BRONCHOSCOPY     COLONOSCOPY WITH PROPOFOL N/A 07/15/2018   Procedure: COLONOSCOPY WITH PROPOFOL;  Surgeon: Gatha Mayer, MD;  Location: [facility];  Service: Endoscopy;  Laterality: N/A;   IR KYPHO EA ADDL LEVEL THORACIC OR LUMBAR  01/13/2021   IR KYPHO THORACIC WITH BONE BIOPSY  01/13/2021   MASS EXCISION Left 12/17/2013   Procedure: EXCISION MASS LEFT WRIST;  Surgeon: Tennis Must, MD;  Location: [place];  Service: Orthopedics;  Laterality: Left;   SEPTOPLASTY  1990    Current Medications: Current Meds  Medication Sig   albuterol (PROAIR HFA) 108 (90 Base) MCG/ACT inhaler Inhale 1-2 puffs into the lungs every 6 (six) hours as needed for wheezing or shortness of breath.   ALPRAZolam (XANAX) 1 MG tablet Take 1 mg by mouth 3 (three) times daily.   B-D 3CC LUER-LOK SYR 22GX1-1/2 22G X 1-1/2" 3 ML MISC    clonazePAM (KLONOPIN) 2 MG tablet Take 2 mg by mouth at bedtime.    diphenhydrAMINE (BENADRYL) 25 MG tablet Take 50 mg by mouth at bedtime as needed for sleep or allergies.   fluticasone (FLONASE) 50 MCG/ACT nasal spray Place 2 sprays into both nostrils daily.   Fluticasone-Salmeterol (ADVAIR) 250-50 MCG/DOSE AEPB Inhale 1 puff into the lungs 2 (two) times daily.   gabapentin (NEURONTIN)  100 MG capsule Take 100 mg by mouth See admin instructions. Take 100 mg 3 times daily, may take an additional 100 mg dose as bedtime as needed for pain   lamoTRIgine (LAMICTAL) 150 MG tablet Take 150 mg by mouth at bedtime.   LATUDA 120 MG TABS Take 120 mg by mouth daily after supper.    lisinopril (ZESTRIL) 20 MG tablet Take 1 tablet (20 mg total) by mouth daily.   methocarbamol (ROBAXIN) 500 MG tablet Take 1 tablet (500 mg total) by mouth 3 (three) times daily as needed for muscle spasms.   naloxone (NARCAN) 4 MG/0.1ML LIQD nasal spray kit Place 4 mg into the nose as needed (opioid overdose). As directed   neomycin-polymyxin  b-dexamethasone (MAXITROL) 3.5-10000-0.1 SUSP Place 1 drop into the right eye 4 (four) times daily.   oxyCODONE-acetaminophen (PERCOCET) 10-325 MG tablet Take 1 tablet by mouth every 4 (four) hours as needed for pain.   pantoprazole (PROTONIX) 20 MG tablet Take 1 tablet (20 mg total) by mouth daily.   QUEtiapine (SEROQUEL) 300 MG tablet Take 450 mg by mouth at bedtime. Taking 1 & 1/2 tabs = 450 mg   sertraline (ZOLOFT) 100 MG tablet Take 100 mg by mouth daily.   sildenafil (REVATIO) 20 MG tablet Take 2-5 tablets (40-100 mg total) by mouth daily as needed (for erectile dysfunction.).   simvastatin (ZOCOR) 40 MG tablet TAKE 1 TABLET BY MOUTH DAILY AT 6 PM.   tamsulosin (FLOMAX) 0.4 MG CAPS capsule Take 0.4 mg by mouth at bedtime.   testosterone cypionate (DEPOTESTOTERONE CYPIONATE) 100 MG/ML injection Inject 100 mg into the muscle once a week.   zolpidem (AMBIEN) 10 MG tablet Take 10 mg by mouth at bedtime.      Allergies:   Patient has no known allergies.   Social History   Socioeconomic History   Marital status: Married    Spouse name: Not on file   Number of children: Not on file   Years of education: Not on file   Highest education level: Not on file  Occupational History    Employer: SELF EMPLOYED  Tobacco Use   Smoking status: Every Day    Packs/day: 0.50    Pack years: 0.00    Types: Cigarettes   Smokeless tobacco: Former  Scientific laboratory technician Use: Former  Substance and Sexual Activity   Alcohol use: No    Alcohol/week: 0.0 standard drinks    Comment: quit 2007   Drug use: No    Comment: history cocaine-last 2006   Sexual activity: Yes  Other Topics Concern   Not on file  Social History Narrative   Married, self-employed   Vapes   No EtOH since 2007, no drug use   Social Determinants of Radio broadcast assistant Strain: Not on file  Food Insecurity: Not on file  Transportation Needs: Not on file  Physical Activity: Not on file  Stress: Not on file  Social  Connections: Not on file    Social: works a Librarian, academic  Family History: The patient's family history includes Cancer in his mother and another family member; Depression in an other family member. There is no history of Colon polyps, Esophageal cancer, Rectal cancer, or Stomach cancer. History of coronary artery disease notable for grandfather and father. History of heart failure notable for no members. History of arrhythmia notable for no members.  ROS:   Please see the history of present illness.     All other systems  reviewed and are negative.  EKGs/Labs/Other Studies Reviewed:    The following studies were reviewed today:  EKG:   11/09/20 Sinus 87 WNL  Transthoracic Echocardiogram: Date: 06/07/2003 Results: SUMMARY   -  Overall left ventricular systolic function was normal. Left         ventricular ejection fraction was estimated , range being 55         % to 65 %. There were no left ventricular regional wall         motion abnormalities.   -  Aortic valve thickness was mildly increased.   -  There was mild mitral valvular regurgitation.   -  There was moderate tricuspid valvular regurgitation.   Date: 11/22/20 Results:  1. Left ventricular ejection fraction, by estimation, is 60 to 65%. The  left ventricle has normal function. The left ventricle has no regional  wall motion abnormalities. There is mild concentric left ventricular  hypertrophy. Left ventricular diastolic  parameters are consistent with Grade I diastolic dysfunction (impaired  relaxation).   2. Right ventricular systolic function is normal. The right ventricular  size is normal. There is normal pulmonary artery systolic pressure.   3. The mitral valve is normal in structure. Trivial mitral valve  regurgitation. No evidence of mitral stenosis.   4. The aortic valve is tricuspid. Aortic valve regurgitation is not  visualized. No aortic stenosis is present.   5. The inferior vena cava is normal in size with  greater than 50%  respiratory variability, suggesting right atrial pressure of 3 mmHg.   CT Angio Chest PE: Date:11/09/20 Personally Reviewed Results:No Coronary Calcium or aortic atherosclerosis 2. No edema or airspace opacity. Areas of mild scarring. Nodular opacities ranging in size from 2-5 mm noted. No larger nodular opacities evident. No follow-up needed if patient is low-risk (and has no known or suspected primary neoplasm). Non-contrast chest CT can be considered in 12 months if patient is high-risk. This recommendation follows the consensus statement: Guidelines for Management of Incidental Pulmonary Nodules Detected on CT Images: From the Fleischner Society 2017; Radiology 2017; 284:228-243.   Recent Labs: 11/03/2020: TSH 1.101 11/05/2020: ALT 66 01/13/2021: BUN 9; Creatinine, Ser 0.90; Hemoglobin 16.3; Platelets 391; Potassium 4.5; Sodium 136  Recent Lipid Panel    Component Value Date/Time   CHOL 153 12/19/2020 1639   TRIG 83 12/19/2020 1639   HDL 45 12/19/2020 1639   CHOLHDL 3.4 12/19/2020 1639   CHOLHDL 4 11/20/2019 0917   VLDL 23.0 11/20/2019 0917   LDLCALC 92 12/19/2020 1639   LDLDIRECT 170.0 10/25/2010 1118    Risk Assessment/Calculations:     The 10-year ASCVD risk score Mikey Bussing DC Brooke Bonito., et al., 2013) is: 6.8%   Values used to calculate the score:     Age: 34 years     Sex: Male     Is Non-Hispanic African American: No     Diabetic: No     Tobacco smoker: Yes     Systolic Blood Pressure: 272 mmHg     Is BP treated: Yes     HDL Cholesterol: 45 mg/dL     Total Cholesterol: 153 mg/dL   Physical Exam:    VS:  BP 112/68   Pulse (!) 104   Ht _0  (1.727 m)   Wt 80.7 kg   SpO2 97%   BMI 27.06 kg/m     Wt Readings from Last 3 Encounters:  03/10/21 80.7 kg  01/13/21 78.9 kg  01/03/21 77.1 kg  GEN:  Well nourished, well developed in no acute distress HEENT: Normal NECK: No JVD LYMPHATICS: No lymphadenopathy CARDIAC: RRR, no murmurs, rubs,  gallops RESPIRATORY:  Clear to auscultation without rales, wheezing or rhonchi  ABDOMEN: Soft, non-tender, non-distended MUSCULOSKELETAL:  No edema; No deformity  SKIN: Warm and dry; chronic scars noted on arms NEUROLOGIC:  Alert and oriented x 3 PSYCHIATRIC:  Scattered affect  ASSESSMENT:    1. Hyperlipidemia, unspecified hyperlipidemia type   2. Nonrheumatic tricuspid valve regurgitation   3. Current nicotine use   4. Essential hypertension     PLAN:    In order of problems listed above:  Non-cardiac chest pain- resolved History of Tricuspid Regurgitation (now trivial) Former Polysubstance abuse- prior IVDU Hyperlipidemia  Prior Rhabdomyolysis non statin related Tobacco Abuse Polysubstance Abuse HTN - CP has resolved off of substances -LDL goal less than 100 (at goal) - continue lisinopril 20 mg PO daily  - no simvastatin related myalgia; continue statin - quit date for substance is still in contemplation, discussed smokefree.gov  PRN follow up unless new symptoms or abnormal test results warranting change in plan  Would be reasonable for  APP Follow up      Medication Adjustments/Labs and Tests Ordered: Current medicines are reviewed at length with the patient today.  Concerns regarding medicines are outlined above.  No orders of the defined types were placed in this encounter.  No orders of the defined types were placed in this encounter.   Patient Instructions  Medication Instructions:  Your physician recommends that you continue on your current medications as directed. Please refer to the Current Medication list given to you today.  *If you need a refill on your cardiac medications before your next appointment, please call your pharmacy*   Lab Work: NONE If you have labs (blood work) drawn today and your tests are completely normal, you will receive your results only by: [place] (if you have MyChart) OR A paper copy in the mail If you have any  lab test that is abnormal or we need to change your treatment, we will call you to review the results.   Testing/Procedures: NONE   Follow-Up: AS NEEDED At [facility], you and your health needs are our priority.  As part of our continuing mission to provide you with exceptional heart care, we have created designated Provider Care Teams.  These Care Teams include your primary Cardiologist (physician) and Advanced Practice Providers (APPs -  Physician Assistants and Nurse Practitioners) who all work together to provide you with the care you need, when you need it.    Signed, Werner Lean, MD  03/10/2021 9:18 AM    [place]

## 2021-03-10 ENCOUNTER — Other Ambulatory Visit: Payer: Self-pay

## 2021-03-10 ENCOUNTER — Other Ambulatory Visit: Payer: BLUE CROSS/BLUE SHIELD | Admitting: *Deleted

## 2021-03-10 ENCOUNTER — Ambulatory Visit: Payer: BLUE CROSS/BLUE SHIELD | Admitting: Internal Medicine

## 2021-03-10 ENCOUNTER — Encounter: Payer: Self-pay | Admitting: Internal Medicine

## 2021-03-10 VITALS — BP 112/68 | HR 104 | Ht 68.0 in | Wt 178.0 lb

## 2021-03-10 DIAGNOSIS — E785 Hyperlipidemia, unspecified: Secondary | ICD-10-CM | POA: Diagnosis not present

## 2021-03-10 DIAGNOSIS — I10 Essential (primary) hypertension: Secondary | ICD-10-CM | POA: Diagnosis not present

## 2021-03-10 DIAGNOSIS — I361 Nonrheumatic tricuspid (valve) insufficiency: Secondary | ICD-10-CM | POA: Diagnosis not present

## 2021-03-10 DIAGNOSIS — Z720 Tobacco use: Secondary | ICD-10-CM | POA: Diagnosis not present

## 2021-03-10 NOTE — Patient Instructions (Signed)
Medication Instructions:  Your physician recommends that you continue on your current medications as directed. Please refer to the Current Medication list given to you today.  *If you need a refill on your cardiac medications before your next appointment, please call your pharmacy*   Lab Work: NONE If you have labs (blood work) drawn today and your tests are completely normal, you will receive your results only by: [place] (if you have MyChart) OR A paper copy in the mail If you have any lab test that is abnormal or we need to change your treatment, we will call you to review the results.   Testing/Procedures: NONE   Follow-Up: AS NEEDED At [facility], you and your health needs are our priority.  As part of our continuing mission to provide you with exceptional heart care, we have created designated Provider Care Teams.  These Care Teams include your primary Cardiologist (physician) and Advanced Practice Providers (APPs -  Physician Assistants and Nurse Practitioners) who all work together to provide you with the care you need, when you need it.

## 2021-07-04 ENCOUNTER — Telehealth (HOSPITAL_COMMUNITY): Payer: Self-pay

## 2021-07-04 NOTE — Telephone Encounter (Signed)
Returned pt's call. Will reach out to [facility] for copy of recent mri spine. I will have Dr. Estanislado Pandy review after and call pt to schedule visit if needed. AW

## 2021-07-10 ENCOUNTER — Ambulatory Visit
Admission: RE | Admit: 2021-07-10 | Discharge: 2021-07-10 | Disposition: A | Discharge status: Home / Self Care | Payer: Self-pay | Source: Ambulatory Visit | Attending: Interventional Radiology | Admitting: Interventional Radiology

## 2021-07-10 ENCOUNTER — Telehealth (HOSPITAL_COMMUNITY): Payer: Self-pay

## 2021-07-10 ENCOUNTER — Other Ambulatory Visit (HOSPITAL_COMMUNITY): Payer: Self-pay | Admitting: Interventional Radiology

## 2021-07-10 DIAGNOSIS — R52 Pain, unspecified: Secondary | ICD-10-CM

## 2021-07-10 NOTE — Telephone Encounter (Signed)
Returned pt's call. Per Dr. Estanislado Pandy, he does not have a new fracture. He will need to f/u with PCP or referring physician. Pt agreed to f/u with PCP for referral elsewhere to help with pain. AW

## 2022-02-15 ENCOUNTER — Other Ambulatory Visit: Payer: Self-pay | Admitting: Family

## 2022-02-15 DIAGNOSIS — I10 Essential (primary) hypertension: Secondary | ICD-10-CM

## 2022-03-21 ENCOUNTER — Inpatient Hospital Stay (HOSPITAL_COMMUNITY)
Admission: EM | Admit: 2022-03-21 | Discharge: 2022-03-25 | DRG: 871 | Disposition: A | Discharge status: Home / Self Care | Payer: BLUE CROSS/BLUE SHIELD | Attending: Internal Medicine | Admitting: Internal Medicine

## 2022-03-21 ENCOUNTER — Other Ambulatory Visit: Payer: Self-pay

## 2022-03-21 DIAGNOSIS — E785 Hyperlipidemia, unspecified: Secondary | ICD-10-CM | POA: Diagnosis present

## 2022-03-21 DIAGNOSIS — N179 Acute kidney failure, unspecified: Secondary | ICD-10-CM | POA: Diagnosis present

## 2022-03-21 DIAGNOSIS — Z79899 Other long term (current) drug therapy: Secondary | ICD-10-CM

## 2022-03-21 DIAGNOSIS — F419 Anxiety disorder, unspecified: Secondary | ICD-10-CM | POA: Diagnosis present

## 2022-03-21 DIAGNOSIS — K219 Gastro-esophageal reflux disease without esophagitis: Secondary | ICD-10-CM | POA: Diagnosis present

## 2022-03-21 DIAGNOSIS — Z808 Family history of malignant neoplasm of other organs or systems: Secondary | ICD-10-CM

## 2022-03-21 DIAGNOSIS — G9341 Metabolic encephalopathy: Secondary | ICD-10-CM | POA: Diagnosis present

## 2022-03-21 DIAGNOSIS — E876 Hypokalemia: Secondary | ICD-10-CM | POA: Diagnosis present

## 2022-03-21 DIAGNOSIS — R6520 Severe sepsis without septic shock: Secondary | ICD-10-CM | POA: Diagnosis present

## 2022-03-21 DIAGNOSIS — I10 Essential (primary) hypertension: Secondary | ICD-10-CM | POA: Diagnosis present

## 2022-03-21 DIAGNOSIS — F17210 Nicotine dependence, cigarettes, uncomplicated: Secondary | ICD-10-CM | POA: Diagnosis present

## 2022-03-21 DIAGNOSIS — F319 Bipolar disorder, unspecified: Secondary | ICD-10-CM | POA: Diagnosis present

## 2022-03-21 DIAGNOSIS — J9601 Acute respiratory failure with hypoxia: Secondary | ICD-10-CM | POA: Diagnosis present

## 2022-03-21 DIAGNOSIS — A419 Sepsis, unspecified organism: Principal | ICD-10-CM | POA: Diagnosis present

## 2022-03-21 DIAGNOSIS — G8929 Other chronic pain: Secondary | ICD-10-CM | POA: Diagnosis present

## 2022-03-21 DIAGNOSIS — Z79890 Hormone replacement therapy (postmenopausal): Secondary | ICD-10-CM

## 2022-03-21 DIAGNOSIS — J189 Pneumonia, unspecified organism: Secondary | ICD-10-CM | POA: Diagnosis present

## 2022-03-21 DIAGNOSIS — Z981 Arthrodesis status: Secondary | ICD-10-CM

## 2022-03-21 DIAGNOSIS — Z7951 Long term (current) use of inhaled steroids: Secondary | ICD-10-CM

## 2022-03-21 DIAGNOSIS — J40 Bronchitis, not specified as acute or chronic: Secondary | ICD-10-CM | POA: Diagnosis present

## 2022-03-21 LAB — CBG MONITORING, ED: Glucose-Capillary: 111 mg/dL — ABNORMAL HIGH (ref 70–99)

## 2022-03-21 MED ORDER — IPRATROPIUM-ALBUTEROL 0.5-2.5 (3) MG/3ML IN SOLN
3.0000 mL | Freq: Once | RESPIRATORY_TRACT | Status: AC
  Administered 2022-03-22: 3 mL via RESPIRATORY_TRACT
  Filled 2022-03-21: qty 3

## 2022-03-21 MED ORDER — SODIUM CHLORIDE 0.9 % IV BOLUS
1000.0000 mL | Freq: Once | INTRAVENOUS | Status: AC
  Administered 2022-03-22: 1000 mL via INTRAVENOUS

## 2022-03-21 MED ORDER — THIAMINE HCL 100 MG/ML IJ SOLN
100.0000 mg | Freq: Once | INTRAMUSCULAR | Status: AC
  Administered 2022-03-22: 100 mg via INTRAVENOUS
  Filled 2022-03-21: qty 2

## 2022-03-21 NOTE — ED Provider Notes (Signed)
[facility] EMERGENCY DEPARTMENT Provider Note   CSN: [id number] Arrival date & time: 03/21/22  2323     History  Chief Complaint  Patient presents with   Altered Mental Status   Possible Overdose    [name] is a 56 y.o. male brought in by EMS for altered mental status.  History is given by EMS as patient is unable to assist and is an unreliable historian at this time.  According to EMS he has an extensive history of polysubstance abuse.  His wife called out to EMS for altered mental status.  She was also intoxicated but thinks he may have been altered for almost a week but was unable to clarify.  EMS reports that when they found him that his head was drooping he was cyanotic and not breathing well.  They were able to arouse him with painful stimulus.  Patient had initial normal oxygen saturations, blood pressure was low however increased when he became more alert.  He states that he stayed awake and denied any drug or alcohol use however appeared to be intoxicated with slurred and slowed speech and some confusion.  During transport patient again became lethargic and somnolent and his blood pressure dropped but again blood pressure increased when patient became more alert.  Upon arrival here patient is notably hypoxic, slurring speech and appears to have narcosis of unknown etiology.  The history is provided by the EMS personnel. The history is limited by the condition of the patient.  Altered Mental Status Presenting symptoms: behavior changes, confusion and lethargy   Severity:  Severe Most recent episode:  More than 2 days ago (per EMS- wife thinks he has been acting "weird" anywhere from 1 week to the last few days- per EMS wife was also intoxicated) Timing:  Constant Chronicity:  New Context: alcohol use and drug use   Associated symptoms: seizures        Home Medications Prior to Admission medications   Medication Sig Start Date End Date Taking?  Authorizing Provider  albuterol (PROAIR HFA) 108 (90 Base) MCG/ACT inhaler Inhale 1-2 puffs into the lungs every 6 (six) hours as needed for wheezing or shortness of breath. 01/11/21   Kennyth Arnold, FNP  ALPRAZolam Duanne Moron) 1 MG tablet Take 1 mg by mouth 3 (three) times daily.    [provider]  B-D 3CC LUER-LOK SYR 22GX1-1/2 22G X 1-1/2" 3 ML MISC  08/14/19   [provider]  clonazePAM (KLONOPIN) 2 MG tablet Take 2 mg by mouth at bedtime.     [provider]  diphenhydrAMINE (BENADRYL) 25 MG tablet Take 50 mg by mouth at bedtime as needed for sleep or allergies.    [provider]  fluticasone (FLONASE) 50 MCG/ACT nasal spray Place 2 sprays into both nostrils daily. 04/29/19   Elby Beck, FNP  Fluticasone-Salmeterol (ADVAIR) 250-50 MCG/DOSE AEPB Inhale 1 puff into the lungs 2 (two) times daily.    [provider]  gabapentin (NEURONTIN) 100 MG capsule Take 100 mg by mouth See admin instructions. Take 100 mg 3 times daily, may take an additional 100 mg dose as bedtime as needed for pain 12/27/20   [provider]  lamoTRIgine (LAMICTAL) 150 MG tablet Take 150 mg by mouth at bedtime.    [provider]  LATUDA 120 MG TABS Take 120 mg by mouth daily after supper.  08/30/17   [provider]  lisinopril (ZESTRIL) 20 MG tablet Take 1 tablet (20  mg total) by mouth daily. 01/11/21   Kennyth Arnold, FNP  methocarbamol (ROBAXIN) 500 MG tablet Take 1 tablet (500 mg total) by mouth 3 (three) times daily as needed for muscle spasms. 08/17/15   Melina Schools, MD  naloxone [facility]) 4 MG/0.1ML LIQD nasal spray kit Place 4 mg into the nose as needed (opioid overdose). As directed    [provider]  neomycin-polymyxin b-dexamethasone (MAXITROL) 3.5-10000-0.1 SUSP Place 1 drop into the right eye 4 (four) times daily. 01/09/21   [provider]  oxyCODONE-acetaminophen (PERCOCET) 10-325 MG tablet Take 1 tablet by mouth  every 4 (four) hours as needed for pain. 08/17/15   Melina Schools, MD  pantoprazole (PROTONIX) 20 MG tablet Take 1 tablet (20 mg total) by mouth daily. 01/11/21   Kennyth Arnold, FNP  QUEtiapine (SEROQUEL) 300 MG tablet Take 450 mg by mouth at bedtime. Taking 1 & 1/2 tabs = 450 mg    [provider]  sertraline (ZOLOFT) 100 MG tablet Take 100 mg by mouth daily.    [provider]  sildenafil (REVATIO) 20 MG tablet Take 2-5 tablets (40-100 mg total) by mouth daily as needed (for erectile dysfunction.). 06/02/20   Elby Beck, FNP  simvastatin (ZOCOR) 40 MG tablet TAKE 1 TABLET BY MOUTH DAILY AT 6 PM. 09/12/20   Elby Beck, FNP  tamsulosin (FLOMAX) 0.4 MG CAPS capsule Take 0.4 mg by mouth at bedtime.    [provider]  testosterone cypionate (DEPOTESTOTERONE CYPIONATE) 100 MG/ML injection Inject 100 mg into the muscle once a week. 11/19/19   [provider]  zolpidem (AMBIEN) 10 MG tablet Take 10 mg by mouth at bedtime.  09/16/17   [provider]      Allergies    Patient has no known allergies.    Review of Systems   Review of Systems  Neurological:  Positive for seizures.  Psychiatric/Behavioral:  Positive for confusion.     Physical Exam Updated Vital Signs BP 95/64   Pulse 85   Temp 98.3 F (36.8 C) (Oral)   Resp 14   Ht _0  (1.727 m)   Wt 79.4 kg   SpO2 97%   BMI 26.61 kg/m  Physical Exam Vitals and nursing note reviewed.  Constitutional:      General: He is not in acute distress.    Appearance: He is well-developed. He is not diaphoretic.  HENT:     Head: Normocephalic and atraumatic.  Eyes:     General: No scleral icterus.    Conjunctiva/sclera: Conjunctivae normal.     Comments: Pinpoint pupils  Cardiovascular:     Rate and Rhythm: Normal rate and regular rhythm.     Heart sounds: Normal heart sounds.  Pulmonary:     Effort: Pulmonary effort is normal. No respiratory distress.     Breath sounds:  Examination of the right-middle field reveals wheezing. Examination of the left-middle field reveals wheezing. Examination of the right-lower field reveals wheezing. Examination of the left-lower field reveals wheezing. Wheezing present.  Abdominal:     Palpations: Abdomen is soft.     Tenderness: There is no abdominal tenderness.  Musculoskeletal:     Cervical back: Normal range of motion and neck supple.  Skin:    General: Skin is warm and dry.  Neurological:     Mental Status: He is easily aroused. He is lethargic.  Psychiatric:        Behavior: Behavior normal.     ED Results /  Procedures / Treatments   Labs (all labs ordered are listed, but only abnormal results are displayed) Labs Reviewed  COMPREHENSIVE METABOLIC PANEL - Abnormal; Notable for the following components:      Result Value   Sodium 134 (*)    Glucose, Bld 107 (*)    Creatinine, Ser 1.58 (*)    Calcium 8.8 (*)    Total Protein 6.4 (*)    Albumin 3.2 (*)    Total Bilirubin 1.7 (*)    GFR, Estimated 52 (*)    All other components within normal limits  CBC WITH DIFFERENTIAL/PLATELET - Abnormal; Notable for the following components:   WBC 15.1 (*)    RBC 3.76 (*)    Hemoglobin 11.8 (*)    HCT 35.1 (*)    Neutro Abs 11.5 (*)    Monocytes Absolute 1.4 (*)    Abs Immature Granulocytes 0.11 (*)    All other components within normal limits  APTT - Abnormal; Notable for the following components:   aPTT 22 (*)    All other components within normal limits  CBG MONITORING, ED - Abnormal; Notable for the following components:   Glucose-Capillary 111 (*)    All other components within normal limits  I-STAT VENOUS BLOOD GAS, ED - Abnormal; Notable for the following components:   Sodium 134 (*)    Potassium 3.3 (*)    Calcium, Ion 1.10 (*)    HCT 34.0 (*)    Hemoglobin 11.6 (*)    All other components within normal limits  TROPONIN I (HIGH SENSITIVITY) - Abnormal; Notable for the following components:   Troponin  I (High Sensitivity) 22 (*)    All other components within normal limits  CULTURE, BLOOD (ROUTINE X 2)  CULTURE, BLOOD (ROUTINE X 2)  LACTIC ACID, PLASMA  ETHANOL  PROTIME-INR  URINALYSIS, ROUTINE W REFLEX MICROSCOPIC  AMMONIA  RAPID URINE DRUG SCREEN, HOSP PERFORMED  TROPONIN I (HIGH SENSITIVITY)    EKG None  Radiology DG Chest 2 View  Result Date: 03/22/2022 CLINICAL DATA:  Encounter for hypoxia. EXAM: CHEST - 2 VIEW COMPARISON:  Portable chest 01/03/2021. FINDINGS: The cardiac size is upper limit of normal. There is a stable mediastinal configuration. Central vessels are normal caliber. Faint patchy hazy increased opacities noted in the right-greater-than-left upper lobes concerning for pneumonitis, with increased central bronchial thickening possibly indicating bronchopneumonia. The mid and lower lung fields are generally clear. No pleural effusion is seen. Mild upper thoracic levoscoliosis is seen with interval wedging deformity and kyphoplasty cement at T5 and T6. IMPRESSION: 1. Increased findings of central bronchitis with increased opacity in both upper lobes concerning for multilobar pneumonia. 2. If pneumonia is clinically suspected, a follow-up study is recommended after treatment to ensure clearing. If other airspace disease etiology is considered, CT or CTA chest is recommended. 3. New finding of wedge compression fracture deformities with kyphoplasty at T5 and 6. Electronically Signed   By: Telford Nab M.D.   On: 03/22/2022 01:15   CT HEAD WO CONTRAST  Result Date: 03/22/2022 CLINICAL DATA:  Delirium. EXAM: CT HEAD WITHOUT CONTRAST TECHNIQUE: Contiguous axial images were obtained from the base of the skull through the vertex without intravenous contrast. RADIATION DOSE REDUCTION: This exam was performed according to the departmental dose-optimization program which includes automated exposure control, adjustment of the mA and/or kV according to patient size and/or use of  iterative reconstruction technique. COMPARISON:  Head CT 11/02/2020. FINDINGS: Brain: No evidence of acute infarction, hemorrhage, hydrocephalus, extra-axial collection  or mass lesion/mass effect. Slight cerebral cortical atrophy is again noted. No findings of significant small-vessel disease. Vascular: There are scattered calcifications of the carotid siphons. There are no hyperdense central vessels. Skull: The calvarium skull base and orbits are intact. Sinuses/Orbits: No focal abnormality of the orbital contents. There is increased circumferential membrane disease and fluid in the right maxillary sinus. There is increased patchy opacification of the bilateral ethmoid air cells. There is mild increased membrane thickening without fluid in the left maxillary and bilateral sphenoid sinus. Frontal sinus and visualized mastoid air cells are clear. Other: None. IMPRESSION: 1. Increased bilateral ethmoid sinus opacification, and increased fluid and membrane disease in the right maxillary sinus, correlate clinically for acute on chronic sinusitis. 2. No acute intracranial CT findings or intracranial interval changes. Electronically Signed   By: Telford Nab M.D.   On: 03/22/2022 01:10    Procedures Procedures    Medications Ordered in ED Medications  lactated ringers infusion ( Intravenous New Bag/Given 03/22/22 0306)  ceFEPIme (MAXIPIME) 2 g in sodium chloride 0.9 % 100 mL IVPB (2 g Intravenous New Bag/Given 03/22/22 0302)  vancomycin (VANCOREADY) IVPB 1500 mg/300 mL (1,500 mg Intravenous New Bag/Given 03/22/22 0308)  ipratropium-albuterol (DUONEB) 0.5-2.5 (3) MG/3ML nebulizer solution 3 mL (3 mLs Nebulization Given 03/22/22 0008)  sodium chloride 0.9 % bolus 1,000 mL (0 mLs Intravenous Stopped 03/22/22 0114)  thiamine (B-1) injection 100 mg (100 mg Intravenous Given 03/22/22 0006)  lactated ringers bolus 1,500 mL (0 mLs Intravenous Stopped 03/22/22 0255)    ED Course/ Medical Decision Making/ A&P Clinical  Course as of 03/22/22 0325  Wed Mar 21, 3081  [id number] 56 year old male with past medical history of polysubstance abuse who presents to the emergency department with chief complaint of altered mental status. The differential diagnosis for AMS is extensive and includes, but is not limited to: drug overdose - opioids, alcohol, sedatives, antipsychotics, drug withdrawal, others; Metabolic: hypoxia, hypoglycemia, hyperglycemia, hypercalcemia, hypernatremia, hyponatremia, uremia, hepatic encephalopathy, hypothyroidism, hyperthyroidism, vitamin B12 or thiamine deficiency, carbon monoxide poisoning, Wilson's disease, Lactic acidosis, DKA/HHOS; Infectious: meningitis, encephalitis, bacteremia/sepsis, urinary tract infection, pneumonia, neurosyphilis; Structural: Space-occupying lesion, (brain tumor, subdural hematoma, hydrocephalus,); Vascular: stroke, subarachnoid hemorrhage, coronary ischemia, hypertensive encephalopathy, CNS vasculitis, thrombotic thrombocytopenic purpura, disseminated intravascular coagulation, hyperviscosity; Psychiatric: Schizophrenia, depression; Other: Seizure, hypothermia, heat stroke, ICU psychosis, dementia -"sundowning."  I have initiated the patient's work-up with labs including lactic acid, chemistries, blood counts, coag studies, troponin.  Imaging including head CT and chest x-ray ordered given patient's hypoxia.  EKG pending [AH]  Thu Mar 22, 2022  0042 WBC(!): 15.1 [AH]  0043 Comprehensive metabolic panel(!) [AH]  5188 Creatinine(!): 1.58 New AKI [AH]  0043 Lactic Acid, Venous: 1.4 [AH]  0043 Alcohol, Ethyl (B): <10 [AH]  0158 Troponin I (High Sensitivity)(!): 22 [AH]  0317 DG Chest 2 View I visualized and interpreted 2 view chest x-ray which shows multifocal pneumonia [AH]  0318 EKG 12-Lead EKG shows sinus tachycardia at a rate of 100 [AH]  0319 CT HEAD WO CONTRAST I visualized and interpreted head CT which shows sinus disease, no acute intracranial abnormality maladies  [AH]  0321 Patient is now awake and alert.  I have updated him on his diagnosis and need for admission. [AH]    Clinical Course User Index [AH] Margarita Mail, PA-C                           Medical Decision Making This patient presents to  the ED for concern of AMS, this involves an extensive number of treatment options, and is a complaint that carries with it a high risk of complications and morbidity. Differential as discussed in ED course  Co morbidities that complicate the patient evaluation       Polysubstance abuse hx, seizure hx   Additional history obtained:  Additional history obtained from EMS at bedside    Lab Tests:  I Ordered, and personally interpreted labs.  The pertinent results include: L elevated creatinine showing AKI, elevated white blood cell count with left shift initial troponin mildly elevated and   Imaging Studies ordered:  I ordered imaging studies including a CT head and chest x-ray I independently visualized and interpreted imaging as discussed in the ED course I agree with the radiologist interpretation   Cardiac Monitoring:       The patient was maintained on a cardiac monitor.  I personally viewed and interpreted the cardiac monitored which showed an underlying rhythm of: Normal sinus rhythm   Medicines ordered and prescription drug management:  I ordered medication including fluid resuscitation and IV antibiotics for hypotension and pneumonia Reevaluation of the patient after these medicines showed that the patient improved I have reviewed the patients home medicines and have made adjustments as needed   Test Considered:       CT angiogram of the chest rule out pulmonary embolism as underlying cause of hypoxia however patient has multifocal pneumonia   Critical Interventions:       IV fluids, oxygen supplementation and broad-spectrum antibiotic      Problem List / ED Course:       AKI, altered mental status, multifocal  pneumonia with hypoxic respiratory failure   Reevaluation:  After the interventions noted above, I reevaluated the patient and found that they have :improved     Dispostion:  After consideration of the diagnostic results and the patients response to treatment, I feel that the patent would benefit from admission.    Amount and/or Complexity of Data Reviewed Labs: ordered. Decision-making details documented in ED Course. Radiology: ordered. Decision-making details documented in ED Course. ECG/medicine tests:  Decision-making details documented in ED Course.  Risk Prescription drug management. Decision regarding hospitalization.           Final Clinical Impression(s) / ED Diagnoses Final diagnoses:  Multifocal pneumonia  Acute respiratory failure with hypoxia [facility])    Rx / DC Orders ED Discharge Orders     None         Margarita Mail, PA-C 03/22/22 0329    Mesner, [name], MD 03/22/22 [phone]

## 2022-03-21 NOTE — ED Provider Notes (Incomplete)
[facility] EMERGENCY DEPARTMENT Provider Note   CSN: [id number] Arrival date & time: 03/21/22  2323     History {Add pertinent medical, surgical, social history, OB history to HPI:1} Chief Complaint  Patient presents with  . Altered Mental Status  . Possible Overdose    [name] is a 56 y.o. male brought in by EMS for altered mental status.  History is given by EMS as patient is unable to assist and is an unreliable historian at this time.  According to EMS he has an extensive history of polysubstance abuse.  His wife called out to EMS for altered mental status.  She was also intoxicated but thinks he may have been altered for almost a week but was unable to clarify.  EMS reports that when they found him that his head was drooping he was cyanotic and not breathing well.  They were able to arouse him with painful stimulus.  Patient had initial normal oxygen saturations, blood pressure was low however increased when he became more alert.  He states that he stayed awake and denied any drug or alcohol use however appeared to be intoxicated with slurred and slowed speech and some confusion.  During transport patient again became lethargic and somnolent and his blood pressure dropped but again blood pressure increased when patient became more alert.  Upon arrival here patient is notably hypoxic, slurring speech and appears to have narcosis of unknown etiology.  The history is provided by the EMS personnel. The history is limited by the condition of the patient.  Altered Mental Status Presenting symptoms: behavior changes, confusion and lethargy   Severity:  Severe Most recent episode:  More than 2 days ago (per EMS- wife thinks he has been acting "weird" anywhere from 1 week to the last few days- per EMS wife was also intoxicated) Timing:  Constant Chronicity:  New Context: alcohol use and drug use   Associated symptoms: seizures        Home Medications Prior to  Admission medications   Medication Sig Start Date End Date Taking? Authorizing Provider  albuterol (PROAIR HFA) 108 (90 Base) MCG/ACT inhaler Inhale 1-2 puffs into the lungs every 6 (six) hours as needed for wheezing or shortness of breath. 01/11/21   Kennyth Arnold, FNP  ALPRAZolam Duanne Moron) 1 MG tablet Take 1 mg by mouth 3 (three) times daily.    [provider]  B-D 3CC LUER-LOK SYR 22GX1-1/2 22G X 1-1/2" 3 ML MISC  08/14/19   [provider]  clonazePAM (KLONOPIN) 2 MG tablet Take 2 mg by mouth at bedtime.     [provider]  diphenhydrAMINE (BENADRYL) 25 MG tablet Take 50 mg by mouth at bedtime as needed for sleep or allergies.    [provider]  fluticasone (FLONASE) 50 MCG/ACT nasal spray Place 2 sprays into both nostrils daily. 04/29/19   Elby Beck, FNP  Fluticasone-Salmeterol (ADVAIR) 250-50 MCG/DOSE AEPB Inhale 1 puff into the lungs 2 (two) times daily.    [provider]  gabapentin (NEURONTIN) 100 MG capsule Take 100 mg by mouth See admin instructions. Take 100 mg 3 times daily, may take an additional 100 mg dose as bedtime as needed for pain 12/27/20   [provider]  lamoTRIgine (LAMICTAL) 150 MG tablet Take 150 mg by mouth at bedtime.    [provider]  LATUDA 120 MG TABS Take 120 mg by mouth daily after supper.  08/30/17   [provider]  lisinopril (ZESTRIL) 20 MG tablet Take 1 tablet (20 mg total) by mouth daily. 01/11/21   Kennyth Arnold, FNP  methocarbamol (ROBAXIN) 500 MG tablet Take 1 tablet (500 mg total) by mouth 3 (three) times daily as needed for muscle spasms. 08/17/15   Melina Schools, MD  naloxone [facility]) 4 MG/0.1ML LIQD nasal spray kit Place 4 mg into the nose as needed (opioid overdose). As directed    [provider]  neomycin-polymyxin b-dexamethasone (MAXITROL) 3.5-10000-0.1 SUSP Place 1 drop into the right eye 4 (four) times daily. 01/09/21   [provider]   oxyCODONE-acetaminophen (PERCOCET) 10-325 MG tablet Take 1 tablet by mouth every 4 (four) hours as needed for pain. 08/17/15   Melina Schools, MD  pantoprazole (PROTONIX) 20 MG tablet Take 1 tablet (20 mg total) by mouth daily. 01/11/21   Kennyth Arnold, FNP  QUEtiapine (SEROQUEL) 300 MG tablet Take 450 mg by mouth at bedtime. Taking 1 & 1/2 tabs = 450 mg    [provider]  sertraline (ZOLOFT) 100 MG tablet Take 100 mg by mouth daily.    [provider]  sildenafil (REVATIO) 20 MG tablet Take 2-5 tablets (40-100 mg total) by mouth daily as needed (for erectile dysfunction.). 06/02/20   Elby Beck, FNP  simvastatin (ZOCOR) 40 MG tablet TAKE 1 TABLET BY MOUTH DAILY AT 6 PM. 09/12/20   Elby Beck, FNP  tamsulosin (FLOMAX) 0.4 MG CAPS capsule Take 0.4 mg by mouth at bedtime.    [provider]  testosterone cypionate (DEPOTESTOTERONE CYPIONATE) 100 MG/ML injection Inject 100 mg into the muscle once a week. 11/19/19   [provider]  zolpidem (AMBIEN) 10 MG tablet Take 10 mg by mouth at bedtime.  09/16/17   [provider]      Allergies    Patient has no known allergies.    Review of Systems   Review of Systems  Neurological:  Positive for seizures.  Psychiatric/Behavioral:  Positive for confusion.     Physical Exam Updated Vital Signs BP (!) 78/48   Pulse (!) 101   Temp 98.3 F (36.8 C) (Oral)   Resp 18   Ht _0  (1.727 m)   Wt 79.4 kg   SpO2 95%   BMI 26.61 kg/m  Physical Exam Vitals and nursing note reviewed.  Constitutional:      General: He is not in acute distress.    Appearance: He is well-developed. He is not diaphoretic.  HENT:     Head: Normocephalic and atraumatic.  Eyes:     General: No scleral icterus.    Conjunctiva/sclera: Conjunctivae normal.     Comments: Pinpoint pupils  Cardiovascular:     Rate and Rhythm: Normal rate and regular rhythm.     Heart sounds: Normal heart sounds.  Pulmonary:      Effort: Pulmonary effort is normal. No respiratory distress.     Breath sounds: Examination of the right-middle field reveals wheezing. Examination of the left-middle field reveals wheezing. Examination of the right-lower field reveals wheezing. Examination of the left-lower field reveals wheezing. Wheezing present.  Abdominal:     Palpations: Abdomen is soft.     Tenderness: There is no abdominal tenderness.  Musculoskeletal:     Cervical back: Normal range of motion and neck supple.  Skin:    General: Skin is warm and dry.  Neurological:     Mental Status: He is easily aroused. He is lethargic.  Psychiatric:  Behavior: Behavior normal.     ED Results / Procedures / Treatments   Labs (all labs ordered are listed, but only abnormal results are displayed) Labs Reviewed  CULTURE, BLOOD (ROUTINE X 2)  CULTURE, BLOOD (ROUTINE X 2)  COMPREHENSIVE METABOLIC PANEL  CBC WITH DIFFERENTIAL/PLATELET  URINALYSIS, ROUTINE W REFLEX MICROSCOPIC  AMMONIA  LACTIC ACID, PLASMA  ETHANOL  RAPID URINE DRUG SCREEN, HOSP PERFORMED  CBG MONITORING, ED  I-STAT VENOUS BLOOD GAS, ED  TROPONIN I (HIGH SENSITIVITY)    EKG None  Radiology No results found.  Procedures Procedures  {Document cardiac monitor, telemetry assessment procedure when appropriate:1}  Medications Ordered in ED Medications  ipratropium-albuterol (DUONEB) 0.5-2.5 (3) MG/3ML nebulizer solution 3 mL (has no administration in time range)  sodium chloride 0.9 % bolus 1,000 mL (has no administration in time range)  thiamine (B-1) injection 100 mg (has no administration in time range)    ED Course/ Medical Decision Making/ A&P                           Medical Decision Making Amount and/or Complexity of Data Reviewed Labs: ordered. Radiology: ordered.  Risk Prescription drug management.   ***  {Document critical care time when appropriate:1} {Document review of labs and clinical decision tools ie heart score,  Chads2Vasc2 etc:1}  {Document your independent review of radiology images, and any outside records:1} {Document your discussion with family members, caretakers, and with consultants:1} {Document social determinants of health affecting pt's care:1} {Document your decision making why or why not admission, treatments were needed:1} Final Clinical Impression(s) / ED Diagnoses Final diagnoses:  None    Rx / DC Orders ED Discharge Orders     None

## 2022-03-21 NOTE — ED Triage Notes (Signed)
Pt BIB EMS from home. Pt wife called out because pt was acting strange. Pt has hx of opoid use and wife reports pt will take whatever he can get his hands on. Pt also has hx of seizures. Pt speech is slow and slurred but able to answer questions.  Pt BP initially in the 90s, improved somewhat, but  last 86/56 HR 100-120s CBG 173 98% RA

## 2022-03-22 ENCOUNTER — Emergency Department (HOSPITAL_COMMUNITY): Payer: BLUE CROSS/BLUE SHIELD

## 2022-03-22 ENCOUNTER — Encounter (HOSPITAL_COMMUNITY): Payer: Self-pay | Admitting: Family Medicine

## 2022-03-22 DIAGNOSIS — R6520 Severe sepsis without septic shock: Secondary | ICD-10-CM | POA: Diagnosis present

## 2022-03-22 DIAGNOSIS — F17210 Nicotine dependence, cigarettes, uncomplicated: Secondary | ICD-10-CM | POA: Diagnosis present

## 2022-03-22 DIAGNOSIS — Z7951 Long term (current) use of inhaled steroids: Secondary | ICD-10-CM | POA: Diagnosis not present

## 2022-03-22 DIAGNOSIS — F419 Anxiety disorder, unspecified: Secondary | ICD-10-CM | POA: Diagnosis present

## 2022-03-22 DIAGNOSIS — F3170 Bipolar disorder, currently in remission, most recent episode unspecified: Secondary | ICD-10-CM

## 2022-03-22 DIAGNOSIS — F319 Bipolar disorder, unspecified: Secondary | ICD-10-CM | POA: Diagnosis present

## 2022-03-22 DIAGNOSIS — K219 Gastro-esophageal reflux disease without esophagitis: Secondary | ICD-10-CM | POA: Diagnosis present

## 2022-03-22 DIAGNOSIS — Z981 Arthrodesis status: Secondary | ICD-10-CM | POA: Diagnosis not present

## 2022-03-22 DIAGNOSIS — J40 Bronchitis, not specified as acute or chronic: Secondary | ICD-10-CM | POA: Diagnosis present

## 2022-03-22 DIAGNOSIS — Z808 Family history of malignant neoplasm of other organs or systems: Secondary | ICD-10-CM | POA: Diagnosis not present

## 2022-03-22 DIAGNOSIS — E785 Hyperlipidemia, unspecified: Secondary | ICD-10-CM | POA: Diagnosis present

## 2022-03-22 DIAGNOSIS — Z79899 Other long term (current) drug therapy: Secondary | ICD-10-CM | POA: Diagnosis not present

## 2022-03-22 DIAGNOSIS — N179 Acute kidney failure, unspecified: Secondary | ICD-10-CM | POA: Diagnosis present

## 2022-03-22 DIAGNOSIS — G9341 Metabolic encephalopathy: Secondary | ICD-10-CM | POA: Diagnosis present

## 2022-03-22 DIAGNOSIS — J9601 Acute respiratory failure with hypoxia: Secondary | ICD-10-CM | POA: Diagnosis present

## 2022-03-22 DIAGNOSIS — G8929 Other chronic pain: Secondary | ICD-10-CM | POA: Diagnosis present

## 2022-03-22 DIAGNOSIS — G894 Chronic pain syndrome: Secondary | ICD-10-CM

## 2022-03-22 DIAGNOSIS — J189 Pneumonia, unspecified organism: Secondary | ICD-10-CM | POA: Diagnosis present

## 2022-03-22 DIAGNOSIS — I10 Essential (primary) hypertension: Secondary | ICD-10-CM | POA: Diagnosis present

## 2022-03-22 DIAGNOSIS — Z79890 Hormone replacement therapy (postmenopausal): Secondary | ICD-10-CM | POA: Diagnosis not present

## 2022-03-22 DIAGNOSIS — E876 Hypokalemia: Secondary | ICD-10-CM | POA: Diagnosis present

## 2022-03-22 DIAGNOSIS — A419 Sepsis, unspecified organism: Secondary | ICD-10-CM | POA: Diagnosis present

## 2022-03-22 LAB — CBC WITH DIFFERENTIAL/PLATELET
Abs Immature Granulocytes: 0.11 10*3/uL — ABNORMAL HIGH (ref 0.00–0.07)
Basophils Absolute: 0 10*3/uL (ref 0.0–0.1)
Basophils Relative: 0 %
Eosinophils Absolute: 0.2 10*3/uL (ref 0.0–0.5)
Eosinophils Relative: 1 %
HCT: 35.1 % — ABNORMAL LOW (ref 39.0–52.0)
Hemoglobin: 11.8 g/dL — ABNORMAL LOW (ref 13.0–17.0)
Immature Granulocytes: 1 %
Lymphocytes Relative: 12 %
Lymphs Abs: 1.8 10*3/uL (ref 0.7–4.0)
MCH: 31.4 pg (ref 26.0–34.0)
MCHC: 33.6 g/dL (ref 30.0–36.0)
MCV: 93.4 fL (ref 80.0–100.0)
Monocytes Absolute: 1.4 10*3/uL — ABNORMAL HIGH (ref 0.1–1.0)
Monocytes Relative: 10 %
Neutro Abs: 11.5 10*3/uL — ABNORMAL HIGH (ref 1.7–7.7)
Neutrophils Relative %: 76 %
Platelets: 228 10*3/uL (ref 150–400)
RBC: 3.76 MIL/uL — ABNORMAL LOW (ref 4.22–5.81)
RDW: 12.5 % (ref 11.5–15.5)
WBC: 15.1 10*3/uL — ABNORMAL HIGH (ref 4.0–10.5)
nRBC: 0 % (ref 0.0–0.2)

## 2022-03-22 LAB — HEPATIC FUNCTION PANEL
ALT: 15 U/L (ref 0–44)
AST: 22 U/L (ref 15–41)
Albumin: 2.6 g/dL — ABNORMAL LOW (ref 3.5–5.0)
Alkaline Phosphatase: 67 U/L (ref 38–126)
Bilirubin, Direct: 0.2 mg/dL (ref 0.0–0.2)
Indirect Bilirubin: 0.5 mg/dL (ref 0.3–0.9)
Total Bilirubin: 0.7 mg/dL (ref 0.3–1.2)
Total Protein: 5.7 g/dL — ABNORMAL LOW (ref 6.5–8.1)

## 2022-03-22 LAB — I-STAT VENOUS BLOOD GAS, ED
Acid-Base Excess: 0 mmol/L (ref 0.0–2.0)
Bicarbonate: 25.8 mmol/L (ref 20.0–28.0)
Calcium, Ion: 1.1 mmol/L — ABNORMAL LOW (ref 1.15–1.40)
HCT: 34 % — ABNORMAL LOW (ref 39.0–52.0)
Hemoglobin: 11.6 g/dL — ABNORMAL LOW (ref 13.0–17.0)
O2 Saturation: 77 %
Potassium: 3.3 mmol/L — ABNORMAL LOW (ref 3.5–5.1)
Sodium: 134 mmol/L — ABNORMAL LOW (ref 135–145)
TCO2: 27 mmol/L (ref 22–32)
pCO2, Ven: 44.7 mmHg (ref 44–60)
pH, Ven: 7.369 (ref 7.25–7.43)
pO2, Ven: 43 mmHg (ref 32–45)

## 2022-03-22 LAB — BASIC METABOLIC PANEL
Anion gap: 8 (ref 5–15)
BUN: 10 mg/dL (ref 6–20)
CO2: 25 mmol/L (ref 22–32)
Calcium: 8.4 mg/dL — ABNORMAL LOW (ref 8.9–10.3)
Chloride: 103 mmol/L (ref 98–111)
Creatinine, Ser: 1.26 mg/dL — ABNORMAL HIGH (ref 0.61–1.24)
GFR, Estimated: >60 mL/min (ref >60)
Glucose, Bld: 110 mg/dL — ABNORMAL HIGH (ref 70–99)
Potassium: 3.5 mmol/L (ref 3.5–5.1)
Sodium: 136 mmol/L (ref 135–145)

## 2022-03-22 LAB — MAGNESIUM: Magnesium: 1.6 mg/dL — ABNORMAL LOW (ref 1.7–2.4)

## 2022-03-22 LAB — RAPID URINE DRUG SCREEN, HOSP PERFORMED
Amphetamines: NOT DETECTED
Barbiturates: NOT DETECTED
Benzodiazepines: POSITIVE — AB
Cocaine: NOT DETECTED
Opiates: POSITIVE — AB
Tetrahydrocannabinol: NOT DETECTED

## 2022-03-22 LAB — URINALYSIS, ROUTINE W REFLEX MICROSCOPIC
Bilirubin Urine: NEGATIVE
Glucose, UA: NEGATIVE mg/dL
Hgb urine dipstick: NEGATIVE
Ketones, ur: NEGATIVE mg/dL
Leukocytes,Ua: NEGATIVE
Nitrite: NEGATIVE
Protein, ur: NEGATIVE mg/dL
Specific Gravity, Urine: 1.013 (ref 1.005–1.030)
pH: 5 (ref 5.0–8.0)

## 2022-03-22 LAB — ETHANOL: Alcohol, Ethyl (B): <10 mg/dL (ref <10)

## 2022-03-22 LAB — COMPREHENSIVE METABOLIC PANEL
ALT: 15 U/L (ref 0–44)
AST: 37 U/L (ref 15–41)
Albumin: 3.2 g/dL — ABNORMAL LOW (ref 3.5–5.0)
Alkaline Phosphatase: 83 U/L (ref 38–126)
Anion gap: 10 (ref 5–15)
BUN: 9 mg/dL (ref 6–20)
CO2: 25 mmol/L (ref 22–32)
Calcium: 8.8 mg/dL — ABNORMAL LOW (ref 8.9–10.3)
Chloride: 99 mmol/L (ref 98–111)
Creatinine, Ser: 1.58 mg/dL — ABNORMAL HIGH (ref 0.61–1.24)
GFR, Estimated: 52 mL/min — ABNORMAL LOW (ref >60)
Glucose, Bld: 107 mg/dL — ABNORMAL HIGH (ref 70–99)
Potassium: 4.1 mmol/L (ref 3.5–5.1)
Sodium: 134 mmol/L — ABNORMAL LOW (ref 135–145)
Total Bilirubin: 1.7 mg/dL — ABNORMAL HIGH (ref 0.3–1.2)
Total Protein: 6.4 g/dL — ABNORMAL LOW (ref 6.5–8.1)

## 2022-03-22 LAB — TROPONIN I (HIGH SENSITIVITY)
Troponin I (High Sensitivity): 14 ng/L (ref <18)
Troponin I (High Sensitivity): 22 ng/L — ABNORMAL HIGH (ref <18)

## 2022-03-22 LAB — APTT: aPTT: 22 seconds — ABNORMAL LOW (ref 24–36)

## 2022-03-22 LAB — STREP PNEUMONIAE URINARY ANTIGEN: Strep Pneumo Urinary Antigen: NEGATIVE

## 2022-03-22 LAB — PROTIME-INR
INR: 1.1 (ref 0.8–1.2)
Prothrombin Time: 14.5 seconds (ref 11.4–15.2)

## 2022-03-22 LAB — AMMONIA: Ammonia: 33 umol/L (ref 9–35)

## 2022-03-22 LAB — PROCALCITONIN: Procalcitonin: <0.1 ng/mL

## 2022-03-22 LAB — LACTIC ACID, PLASMA: Lactic Acid, Venous: 1.4 mmol/L (ref 0.5–1.9)

## 2022-03-22 MED ORDER — LAMOTRIGINE 25 MG PO TABS
150.0000 mg | ORAL_TABLET | Freq: Every day | ORAL | Status: DC
  Administered 2022-03-22 – 2022-03-24 (×3): 150 mg via ORAL
  Filled 2022-03-22 (×3): qty 2

## 2022-03-22 MED ORDER — MAGNESIUM SULFATE 2 GM/50ML IV SOLN
2.0000 g | Freq: Once | INTRAVENOUS | Status: AC
  Administered 2022-03-22: 2 g via INTRAVENOUS
  Filled 2022-03-22: qty 50

## 2022-03-22 MED ORDER — SODIUM CHLORIDE 0.9 % IV SOLN
2.0000 g | Freq: Once | INTRAVENOUS | Status: AC
  Administered 2022-03-22: 2 g via INTRAVENOUS
  Filled 2022-03-22: qty 12.5

## 2022-03-22 MED ORDER — GABAPENTIN 100 MG PO CAPS
100.0000 mg | ORAL_CAPSULE | Freq: Three times a day (TID) | ORAL | Status: DC
  Administered 2022-03-22 – 2022-03-25 (×10): 100 mg via ORAL
  Filled 2022-03-22 (×10): qty 1

## 2022-03-22 MED ORDER — ENOXAPARIN SODIUM 40 MG/0.4ML IJ SOSY
40.0000 mg | PREFILLED_SYRINGE | Freq: Every day | INTRAMUSCULAR | Status: DC
  Administered 2022-03-22 – 2022-03-25 (×4): 40 mg via SUBCUTANEOUS
  Filled 2022-03-22 (×4): qty 0.4

## 2022-03-22 MED ORDER — SODIUM CHLORIDE 0.9 % IV SOLN: 2.0000 g | Freq: Two times a day (BID) | INTRAVENOUS | Status: DC

## 2022-03-22 MED ORDER — SODIUM CHLORIDE 0.9 % IV SOLN
500.0000 mg | INTRAVENOUS | Status: DC
  Administered 2022-03-22: 500 mg via INTRAVENOUS
  Filled 2022-03-22 (×2): qty 5

## 2022-03-22 MED ORDER — ZOLPIDEM TARTRATE 5 MG PO TABS
10.0000 mg | ORAL_TABLET | Freq: Every day | ORAL | Status: DC
  Administered 2022-03-22 – 2022-03-24 (×3): 10 mg via ORAL
  Filled 2022-03-22 (×3): qty 2

## 2022-03-22 MED ORDER — LACTATED RINGERS IV BOLUS (SEPSIS)
1500.0000 mL | Freq: Once | INTRAVENOUS | Status: AC
  Administered 2022-03-22: 1500 mL via INTRAVENOUS

## 2022-03-22 MED ORDER — VANCOMYCIN HCL IN DEXTROSE 1-5 GM/200ML-% IV SOLN: 1000.0000 mg | Freq: Once | INTRAVENOUS | Status: DC

## 2022-03-22 MED ORDER — LACTATED RINGERS IV SOLN: INTRAVENOUS | Status: DC

## 2022-03-22 MED ORDER — ALBUTEROL SULFATE HFA 108 (90 BASE) MCG/ACT IN AERS: 1.0000 | INHALATION_SPRAY | Freq: Four times a day (QID) | RESPIRATORY_TRACT | Status: DC | PRN

## 2022-03-22 MED ORDER — SODIUM CHLORIDE 0.9 % IV SOLN
2.0000 g | INTRAVENOUS | Status: DC
  Administered 2022-03-22 – 2022-03-25 (×4): 2 g via INTRAVENOUS
  Filled 2022-03-22 (×4): qty 20

## 2022-03-22 MED ORDER — OXYCODONE-ACETAMINOPHEN 5-325 MG PO TABS
1.0000 | ORAL_TABLET | ORAL | Status: DC | PRN
  Administered 2022-03-22 – 2022-03-25 (×11): 1 via ORAL
  Filled 2022-03-22 (×12): qty 1

## 2022-03-22 MED ORDER — NICOTINE 21 MG/24HR TD PT24
21.0000 mg | MEDICATED_PATCH | Freq: Every day | TRANSDERMAL | Status: DC
  Administered 2022-03-22 – 2022-03-25 (×4): 21 mg via TRANSDERMAL
  Filled 2022-03-22 (×4): qty 1

## 2022-03-22 MED ORDER — TAMSULOSIN HCL 0.4 MG PO CAPS
0.4000 mg | ORAL_CAPSULE | Freq: Every day | ORAL | Status: DC
  Administered 2022-03-22 – 2022-03-24 (×3): 0.4 mg via ORAL
  Filled 2022-03-22 (×3): qty 1

## 2022-03-22 MED ORDER — SODIUM CHLORIDE 0.9% FLUSH
3.0000 mL | Freq: Two times a day (BID) | INTRAVENOUS | Status: DC
Start: 2022-03-22 — End: 2022-03-25
  Administered 2022-03-22 – 2022-03-25 (×7): 3 mL via INTRAVENOUS

## 2022-03-22 MED ORDER — OXYCODONE-ACETAMINOPHEN 10-325 MG PO TABS: 1.0000 | ORAL_TABLET | ORAL | Status: DC | PRN

## 2022-03-22 MED ORDER — IPRATROPIUM-ALBUTEROL 0.5-2.5 (3) MG/3ML IN SOLN
3.0000 mL | Freq: Four times a day (QID) | RESPIRATORY_TRACT | Status: DC | PRN
  Administered 2022-03-23 – 2022-03-24 (×2): 3 mL via RESPIRATORY_TRACT
  Filled 2022-03-22: qty 3

## 2022-03-22 MED ORDER — VANCOMYCIN HCL 1500 MG/300ML IV SOLN
1500.0000 mg | Freq: Once | INTRAVENOUS | Status: AC
  Administered 2022-03-22: 1500 mg via INTRAVENOUS
  Filled 2022-03-22: qty 300

## 2022-03-22 MED ORDER — PANTOPRAZOLE SODIUM 20 MG PO TBEC
20.0000 mg | DELAYED_RELEASE_TABLET | Freq: Every day | ORAL | Status: DC
  Administered 2022-03-22 – 2022-03-25 (×4): 20 mg via ORAL
  Filled 2022-03-22 (×4): qty 1

## 2022-03-22 MED ORDER — QUETIAPINE FUMARATE 50 MG PO TABS
450.0000 mg | ORAL_TABLET | Freq: Every day | ORAL | Status: DC
  Administered 2022-03-22 – 2022-03-24 (×3): 450 mg via ORAL
  Filled 2022-03-22 (×3): qty 1

## 2022-03-22 MED ORDER — SIMVASTATIN 20 MG PO TABS
40.0000 mg | ORAL_TABLET | Freq: Every day | ORAL | Status: DC
  Administered 2022-03-22 – 2022-03-24 (×3): 40 mg via ORAL
  Filled 2022-03-22 (×3): qty 2

## 2022-03-22 MED ORDER — OXYCODONE HCL 5 MG PO TABS
5.0000 mg | ORAL_TABLET | ORAL | Status: DC | PRN
  Administered 2022-03-22 – 2022-03-25 (×9): 5 mg via ORAL
  Filled 2022-03-22 (×10): qty 1

## 2022-03-22 MED ORDER — VANCOMYCIN HCL 1500 MG/300ML IV SOLN: 1500.0000 mg | INTRAVENOUS | Status: DC

## 2022-03-22 MED ORDER — ALPRAZOLAM 0.5 MG PO TABS
1.0000 mg | ORAL_TABLET | Freq: Three times a day (TID) | ORAL | Status: DC
  Administered 2022-03-22 – 2022-03-25 (×10): 1 mg via ORAL
  Filled 2022-03-22 (×10): qty 2

## 2022-03-22 MED ORDER — FLUTICASONE PROPIONATE 50 MCG/ACT NA SUSP
2.0000 | Freq: Every day | NASAL | Status: DC
  Administered 2022-03-23 – 2022-03-25 (×3): 2 via NASAL
  Filled 2022-03-22: qty 16

## 2022-03-22 MED ORDER — SERTRALINE HCL 100 MG PO TABS
100.0000 mg | ORAL_TABLET | Freq: Every day | ORAL | Status: DC
  Administered 2022-03-22 – 2022-03-25 (×4): 100 mg via ORAL
  Filled 2022-03-22 (×4): qty 1

## 2022-03-22 MED ORDER — METHOCARBAMOL 500 MG PO TABS
500.0000 mg | ORAL_TABLET | Freq: Three times a day (TID) | ORAL | Status: DC | PRN
  Administered 2022-03-25: 500 mg via ORAL
  Filled 2022-03-22: qty 1

## 2022-03-22 NOTE — ED Notes (Signed)
Pt desatting to 80s on room air - placed on 4L [place]

## 2022-03-22 NOTE — TOC Initial Note (Signed)
Transition of Care [facility]) - Initial/Assessment Note    Patient Details  Name: [name] MRN: [id number] Date of Birth: [birth year]  Transition of Care [facility]) CM/SW Contact:    Ninfa Meeker, RN Phone Number: 03/22/2022, 9:18 AM  Clinical Narrative:   Transition of Care Screening Note:  Transition of Care Department [facility]) has reviewed patient and no TOC needs have been identified at this time. We will continue to monitor patient advancement through Interdisciplinary progressions. If new patient transition needs arise, please place a consult.                         Patient Goals and CMS Choice        Expected Discharge Plan and Services                                                Prior Living Arrangements/Services                       Activities of Daily Living Home Assistive Devices/Equipment: None ADL Screening (condition at time of admission) Patient's cognitive ability adequate to safely complete daily activities?: Yes Is the patient deaf or have difficulty hearing?: No Does the patient have difficulty seeing, even when wearing glasses/contacts?: No Does the patient have difficulty concentrating, remembering, or making decisions?: No Patient able to express need for assistance with ADLs?: Yes Does the patient have difficulty dressing or bathing?: No Independently performs ADLs?: Yes (appropriate for developmental age) Does the patient have difficulty walking or climbing stairs?: No Weakness of Legs: None Weakness of Arms/Hands: None  Permission Sought/Granted                  Emotional Assessment              Admission diagnosis:  Acute respiratory failure with hypoxia ([place]) [J96.01] Multifocal pneumonia [J18.9] Patient Active Problem List   Diagnosis Date Noted   Multifocal pneumonia 03/22/2022   Acute respiratory failure with hypoxia ([place]) 03/22/2022   AKI (acute kidney injury) ([place]) 03/22/2022   Chronic pain  03/22/2022   Anxiety    Nonrheumatic tricuspid valve regurgitation 11/18/2020   Cocaine abuse ([place]) 11/18/2020   Polysubstance abuse ([place]) 11/02/2020   Insomnia due to medical condition 10/26/2018   Current nicotine use 10/26/2018   Benign neoplasm of ascending colon    DDD (degenerative disc disease), cervical 12/17/2017   Lumbar radiculopathy 12/17/2017   Neck pain 08/17/2015   Special screening for malignant neoplasms, colon 08/04/2015   Low testosterone 05/02/2015   Back pain 04/28/2015   Hemorrhoid 04/28/2015   Ganglion cyst of wrist 11/20/2013   Testosterone deficiency 11/19/2011   HLD (hyperlipidemia) 10/25/2010   Bipolar disorder ([place]) 10/25/2010   TOBACCO ABUSE 10/25/2010   Essential hypertension 10/25/2010   GERD 10/25/2010   PCP:  [facility]:   [place] [place] [address] Phone: [phone] Fax: [phone]  [facility] [phone] [facility] [place], [address] [place] [address] Phone: [phone] Fax: [phone]     Social Determinants of Health (SDOH) Interventions    Readmission Risk Interventions     No data to display

## 2022-03-22 NOTE — Plan of Care (Signed)
  Problem: Education: Goal: Knowledge of General Education information will improve Description Including pain rating scale, medication(s)/side effects and non-pharmacologic comfort measures Outcome: Progressing   

## 2022-03-22 NOTE — Progress Notes (Addendum)
Patient seen and examined at the bedside.  He complains of generalized weakness, cough, shortness of breath and wheezing.  He said he normally wheezes because he smokes cigarettes. He is a 56 year old man admitted to the hospital for severe sepsis secondary to multifocal pneumonia complicated by AKI, acute hypoxic respiratory failure and acute metabolic encephalopathy.  Continue empiric IV antibiotics.  Home medications have been resumed per his request.

## 2022-03-22 NOTE — ED Notes (Signed)
Pt taken to CT.

## 2022-03-22 NOTE — ED Notes (Signed)
Pt assisted to stand at edge of bed to try and urinate - unsuccessful - pt states he feels that he cannot urinate - PA notified - pt to be bladder scanned

## 2022-03-22 NOTE — Plan of Care (Signed)
  Problem: Clinical Measurements: Goal: Respiratory complications will improve Outcome: Progressing   Problem: Activity: Goal: Risk for activity intolerance will decrease Outcome: Progressing   Problem: Coping: Goal: Level of anxiety will decrease Outcome: Progressing   Problem: Safety: Goal: Ability to remain free from injury will improve Outcome: Progressing   Problem: Safety: Goal: Verbalization of understanding the information provided will improve Outcome: Progressing

## 2022-03-22 NOTE — H&P (Signed)
History and Physical    [name] KYH:[id number] DOB: [birth year] DOA: 03/21/2022  PCP: [facility]   Patient coming from: Home   Chief Complaint: SOB, cough, AMS   HPI: [name] is a pleasant 56 y.o. male with medical history significant for bipolar disorder, chronic pain, hypertension, and substance abuse per report of his wife who called EMS to report that patient was acting strange.  He was found to be lethargic, confused, hypotensive, and hypoxic with EMS, woke to painful stimulus, and improved with supplemental oxygen and continued stimulation.  When more alert in the ED, he notes that he has been progressively short of breath over the past 2 to 3 weeks and has had a productive cough.  He denies any alcohol use, denies illicit substance use, but is prescribed Percocet, Xanax, and Ambien which she has been taking.  ED Course: Upon arrival to the ED, patient is found to be afebrile and saturating 86% on room air with slightly elevated heart rate and blood pressure 78/48.  Chemistry panel notable for creatinine 1.58 and CBC features a leukocytosis to 15,100.  Chest x-ray concerning for multifocal pneumonia.  Blood cultures were collected and the patient was given 2.5 L of IV fluids, thiamine, DuoNeb, vancomycin, and cefepime.  Review of Systems:  All other systems reviewed and apart from HPI, are negative.  Past Medical History:  Diagnosis Date   Anxiety    Arthritis    degeneration of spine & scoliosis   Bipolar disorder ([place])    Chest pain    GERD (gastroesophageal reflux disease)    H/O echocardiogram ?2015   Hyperlipidemia    Hypertension    Pneumonia 2002   [place] admission    Shortness of breath dyspnea    Sleep apnea    test 3/15-waiting results-was told oxygen did drop- did additional study done at home, told that he doesn't have sleep apnea     Past Surgical History:  Procedure Laterality Date   ANTERIOR CERVICAL DECOMP/DISCECTOMY FUSION  N/A 08/17/2015   Procedure: ANTERIOR CERVICAL DECOMPRESSION/DISCECTOMY FUSION Cervical five-cervical seven. Two LEVELS;  Surgeon: Melina Schools, MD;  Location: [facility];  Service: Orthopedics;  Laterality: N/A;   BIOPSY  07/15/2018   Procedure: BIOPSY;  Surgeon: Gatha Mayer, MD;  Location: [facility];  Service: Endoscopy;;   BRONCHOSCOPY     COLONOSCOPY WITH PROPOFOL N/A 07/15/2018   Procedure: COLONOSCOPY WITH PROPOFOL;  Surgeon: Gatha Mayer, MD;  Location: [facility];  Service: Endoscopy;  Laterality: N/A;   IR KYPHO EA ADDL LEVEL THORACIC OR LUMBAR  01/13/2021   IR KYPHO THORACIC WITH BONE BIOPSY  01/13/2021   MASS EXCISION Left 12/17/2013   Procedure: EXCISION MASS LEFT WRIST;  Surgeon: Tennis Must, MD;  Location: [facility];  Service: Orthopedics;  Laterality: Left;   SEPTOPLASTY  1990    Social History:   reports that he has been smoking cigarettes. He has been smoking an average of .5 packs per day. He has quit using smokeless tobacco. He reports that he does not drink alcohol and does not use drugs.  No Known Allergies  Family History  Problem Relation Age of Onset   Cancer Mother        melanoma   Depression Other    Cancer Other        skin   Colon polyps Neg Hx    Esophageal cancer Neg Hx    Rectal cancer Neg Hx  Stomach cancer Neg Hx      Prior to Admission medications   Medication Sig Start Date End Date Taking? Authorizing Provider  albuterol (PROAIR HFA) 108 (90 Base) MCG/ACT inhaler Inhale 1-2 puffs into the lungs every 6 (six) hours as needed for wheezing or shortness of breath. 01/11/21   Kennyth Arnold, FNP  ALPRAZolam Duanne Moron) 1 MG tablet Take 1 mg by mouth 3 (three) times daily.    [provider]  B-D 3CC LUER-LOK SYR 22GX1-1/2 22G X 1-1/2" 3 ML MISC  08/14/19   [provider]  clonazePAM (KLONOPIN) 2 MG tablet Take 2 mg by mouth at bedtime.     [provider]  diphenhydrAMINE (BENADRYL) 25 MG tablet  Take 50 mg by mouth at bedtime as needed for sleep or allergies.    [provider]  fluticasone (FLONASE) 50 MCG/ACT nasal spray Place 2 sprays into both nostrils daily. 04/29/19   Elby Beck, FNP  Fluticasone-Salmeterol (ADVAIR) 250-50 MCG/DOSE AEPB Inhale 1 puff into the lungs 2 (two) times daily.    [provider]  gabapentin (NEURONTIN) 100 MG capsule Take 100 mg by mouth See admin instructions. Take 100 mg 3 times daily, may take an additional 100 mg dose as bedtime as needed for pain 12/27/20   [provider]  lamoTRIgine (LAMICTAL) 150 MG tablet Take 150 mg by mouth at bedtime.    [provider]  LATUDA 120 MG TABS Take 120 mg by mouth daily after supper.  08/30/17   [provider]  lisinopril (ZESTRIL) 20 MG tablet Take 1 tablet (20 mg total) by mouth daily. 01/11/21   Kennyth Arnold, FNP  methocarbamol (ROBAXIN) 500 MG tablet Take 1 tablet (500 mg total) by mouth 3 (three) times daily as needed for muscle spasms. 08/17/15   Melina Schools, MD  naloxone [facility]) 4 MG/0.1ML LIQD nasal spray kit Place 4 mg into the nose as needed (opioid overdose). As directed    [provider]  neomycin-polymyxin b-dexamethasone (MAXITROL) 3.5-10000-0.1 SUSP Place 1 drop into the right eye 4 (four) times daily. 01/09/21   [provider]  oxyCODONE-acetaminophen (PERCOCET) 10-325 MG tablet Take 1 tablet by mouth every 4 (four) hours as needed for pain. 08/17/15   Melina Schools, MD  pantoprazole (PROTONIX) 20 MG tablet Take 1 tablet (20 mg total) by mouth daily. 01/11/21   Kennyth Arnold, FNP  QUEtiapine (SEROQUEL) 300 MG tablet Take 450 mg by mouth at bedtime. Taking 1 & 1/2 tabs = 450 mg    [provider]  sertraline (ZOLOFT) 100 MG tablet Take 100 mg by mouth daily.    [provider]  sildenafil (REVATIO) 20 MG tablet Take 2-5 tablets (40-100 mg total) by mouth daily as needed (for erectile dysfunction.). 06/02/20    Elby Beck, FNP  simvastatin (ZOCOR) 40 MG tablet TAKE 1 TABLET BY MOUTH DAILY AT 6 PM. 09/12/20   Elby Beck, FNP  tamsulosin (FLOMAX) 0.4 MG CAPS capsule Take 0.4 mg by mouth at bedtime.    [provider]  testosterone cypionate (DEPOTESTOTERONE CYPIONATE) 100 MG/ML injection Inject 100 mg into the muscle once a week. 11/19/19   [provider]  zolpidem (AMBIEN) 10 MG tablet Take 10 mg by mouth at bedtime.  09/16/17   [provider]    Physical Exam: Vitals:   03/22/22 0350 03/22/22 0400 03/22/22 0415 03/22/22 0449  BP:  109/81 107/82 100/67  Pulse:  94 98 92  Resp:  20 (!) 24 20  Temp: 98.1 F (36.7 C)   98 F (36.7 C)  TempSrc: Oral   Oral  SpO2:  97% 94% 97%  Weight:      Height:        Constitutional: NAD, calm  Eyes: PERTLA, lids and conjunctivae normal ENMT: Mucous membranes are moist. Posterior pharynx clear of any exudate or lesions.   Neck: supple, no masses  Respiratory: Coarse rales bilaterally, no wheezes. No accessory muscle use.  Cardiovascular: S1 & S2 heard, regular rate and rhythm. No extremity edema.  Abdomen: No distension, no tenderness, soft. Bowel sounds active.  Musculoskeletal: no clubbing / cyanosis. No joint deformity upper and lower extremities.   Skin: no significant rashes, lesions, ulcers. Warm, dry, well-perfused. Neurologic: CN 2-12 grossly intact. Moving all extremities. Alert and oriented to person and situation but not location or month.  Psychiatric: Pleasant. Cooperative.    Labs and Imaging on Admission: I have personally reviewed following labs and imaging studies  CBC: Recent Labs  Lab 03/21/22 2351 03/21/22 2358  WBC 15.1*  --   NEUTROABS 11.5*  --   HGB 11.8* 11.6*  HCT 35.1* 34.0*  MCV 93.4  --   PLT 228  --    Basic Metabolic Panel: Recent Labs  Lab 03/21/22 2351 03/21/22 2358  NA 134* 134*  K 4.1 3.3*  CL 99  --   CO2 25  --   GLUCOSE 107*  --   BUN 9  --    CREATININE 1.58*  --   CALCIUM 8.8*  --    GFR: Estimated Creatinine Clearance: 51.7 mL/min (A) (by C-G formula based on SCr of 1.58 mg/dL (H)). Liver Function Tests: Recent Labs  Lab 03/21/22 2351  AST 37  ALT 15  ALKPHOS 83  BILITOT 1.7*  PROT 6.4*  ALBUMIN 3.2*   No results for input(s): "LIPASE", "AMYLASE" in the last 168 hours. Recent Labs  Lab 03/22/22 0459  AMMONIA 33   Coagulation Profile: Recent Labs  Lab 03/21/22 2351  INR 1.1   Cardiac Enzymes: No results for input(s): "CKTOTAL", "CKMB", "CKMBINDEX", "TROPONINI" in the last 168 hours. BNP (last 3 results) No results for input(s): "PROBNP" in the last 8760 hours. HbA1C: No results for input(s): "HGBA1C" in the last 72 hours. CBG: Recent Labs  Lab 03/21/22 2340  GLUCAP 111*   Lipid Profile: No results for input(s): "CHOL", "HDL", "LDLCALC", "TRIG", "CHOLHDL", "LDLDIRECT" in the last 72 hours. Thyroid Function Tests: No results for input(s): "TSH", "T4TOTAL", "FREET4", "T3FREE", "THYROIDAB" in the last 72 hours. Anemia Panel: No results for input(s): "VITAMINB12", "FOLATE", "FERRITIN", "TIBC", "IRON", "RETICCTPCT" in the last 72 hours. Urine analysis:    Component Value Date/Time   COLORURINE YELLOW 03/22/2022 0357   APPEARANCEUR HAZY (A) 03/22/2022 0357   LABSPEC 1.013 03/22/2022 0357   PHURINE 5.0 03/22/2022 [place] 03/22/2022 0357   HGBUR NEGATIVE 03/22/2022 [place] 03/22/2022 0357   BILIRUBINUR neg 02/14/2011 1041   KETONESUR NEGATIVE 03/22/2022 0357   PROTEINUR NEGATIVE 03/22/2022 0357   UROBILINOGEN neg 02/14/2011 1041   NITRITE NEGATIVE 03/22/2022 0357   LEUKOCYTESUR NEGATIVE 03/22/2022 0357   Sepsis Labs: _0 (procalcitonin:4,lacticidven:4) )No results found for this or any previous visit (from the past 240 hour(s)).   Radiological Exams on Admission: DG Chest 2 View  Result Date: 03/22/2022 CLINICAL DATA:  Encounter for hypoxia. EXAM:  CHEST - 2 VIEW COMPARISON:  Portable chest 01/03/2021. FINDINGS: The cardiac size  is upper limit of normal. There is a stable mediastinal configuration. Central vessels are normal caliber. Faint patchy hazy increased opacities noted in the right-greater-than-left upper lobes concerning for pneumonitis, with increased central bronchial thickening possibly indicating bronchopneumonia. The mid and lower lung fields are generally clear. No pleural effusion is seen. Mild upper thoracic levoscoliosis is seen with interval wedging deformity and kyphoplasty cement at T5 and T6. IMPRESSION: 1. Increased findings of central bronchitis with increased opacity in both upper lobes concerning for multilobar pneumonia. 2. If pneumonia is clinically suspected, a follow-up study is recommended after treatment to ensure clearing. If other airspace disease etiology is considered, CT or CTA chest is recommended. 3. New finding of wedge compression fracture deformities with kyphoplasty at T5 and 6. Electronically Signed   By: Telford Nab M.D.   On: 03/22/2022 01:15   CT HEAD WO CONTRAST  Result Date: 03/22/2022 CLINICAL DATA:  Delirium. EXAM: CT HEAD WITHOUT CONTRAST TECHNIQUE: Contiguous axial images were obtained from the base of the skull through the vertex without intravenous contrast. RADIATION DOSE REDUCTION: This exam was performed according to the departmental dose-optimization program which includes automated exposure control, adjustment of the mA and/or kV according to patient size and/or use of iterative reconstruction technique. COMPARISON:  Head CT 11/02/2020. FINDINGS: Brain: No evidence of acute infarction, hemorrhage, hydrocephalus, extra-axial collection or mass lesion/mass effect. Slight cerebral cortical atrophy is again noted. No findings of significant small-vessel disease. Vascular: There are scattered calcifications of the carotid siphons. There are no hyperdense central vessels. Skull: The calvarium skull  base and orbits are intact. Sinuses/Orbits: No focal abnormality of the orbital contents. There is increased circumferential membrane disease and fluid in the right maxillary sinus. There is increased patchy opacification of the bilateral ethmoid air cells. There is mild increased membrane thickening without fluid in the left maxillary and bilateral sphenoid sinus. Frontal sinus and visualized mastoid air cells are clear. Other: None. IMPRESSION: 1. Increased bilateral ethmoid sinus opacification, and increased fluid and membrane disease in the right maxillary sinus, correlate clinically for acute on chronic sinusitis. 2. No acute intracranial CT findings or intracranial interval changes. Electronically Signed   By: Telford Nab M.D.   On: 03/22/2022 01:10    EKG: Independently reviewed. Sinus tachycardia, rate 100.   Assessment/Plan   1. Severe sepsis d/t multifocal pneumonia; acute hypoxic respiratory failure  - Pt reports 2-3 weeks of productive cough and worsening SOB, was lethargic and confused initially, and found to be hypotensive with hypoxia, leukocytosis, AKI, and multifocal PNA on CXR  - Blood cultures were collected in ED, IVF bolus was given, and he was started on antibiotics  - Check sputum culture, check strep pneumo and legionella antigens, trend procalcitonin, continue supplemental O2 as needed, and treat with Rocephin and azithromycin    2. Acute encephalopathy  - EMS was called by patient's wife who reported he "was acting strange" for days to a week    - He presents lethargic, hypotensive, and hypoxic but woke to painful stimuli and has become more alert with IVF and supplemental oxygen  - No acute findings on head CT in ED and ammonia was normal  - He is not oriented to place or month in ED but is improving  - Likely combination of polypharmacy (Percocet 10, Ambien 10, and Xanax 1 mg) with decreased GFR and sepsis with hypotension and hypoxia  - Anticipate continued  improvement with treatment of sepsis and holding sedating medications   3. AKI  -  SCr is 1.58 on admission, up from baseline of 0.9   - He was hypotensive initially and this is likely prerenal  - He was fluid-resuscitated in ED  - Hold lisinopril, renally-dose medications, avoid hypotension, and repeat chem panel in am    4. Chronic pain  - Prescription database reviewed  - Plan to hold sedating medications initially   5. Depression, anxiety  - Hold sedating medications initially     DVT prophylaxis: Lovenox  Code Status: Full  Level of Care: Level of care: Progressive Family Communication: none present  Disposition Plan:  Patient is from: home  Anticipated d/c is to: home  Anticipated d/c date is: 7/1 or 03/25/22  Patient currently: pending improved respiratory status  Consults called: none  Admission status: Inpatient     Vianne Bulls, MD [facility]  03/22/2022, 6:24 AM

## 2022-03-22 NOTE — Progress Notes (Signed)
Pharmacy Antibiotic Note  [name] is a 56 y.o. male admitted on 03/21/2022 with pneumonia.  Pharmacy has been consulted for Vancomycin/Cefepime dosing. WBC is elevated. Mild bump in Scr.   Plan: Vancomycin 1500 mg IV q24h >>>Estimated AUC: 554 Cefepime 2g IV q12h Trend WBC, temp, renal function  F/U infectious work-up Drug levels as indicated  Height: '5\' 8"'$  (172.7 cm) Weight: 79.4 kg (175 lb) IBW/kg (Calculated) : 68.4  Temp (24hrs), Avg:98.3 F (36.8 C), Min:98.3 F (36.8 C), Max:98.3 F (36.8 C)  Recent Labs  Lab 03/21/22 2351  WBC 15.1*  CREATININE 1.58*  LATICACIDVEN 1.4    Estimated Creatinine Clearance: 51.7 mL/min (A) (by C-G formula based on SCr of 1.58 mg/dL (H)).    No Known Allergies  Narda Bonds, PharmD, BCPS Clinical Pharmacist Phone: [phone]

## 2022-03-22 NOTE — ED Notes (Signed)
Pt states he is unable to urinate at this time - urinal left at bedside

## 2022-03-22 NOTE — Sepsis Progress Note (Addendum)
Monitoring for the code sepsis protocol.  Protocol ordered >2 hours after blood cultures & lactic were collected.

## 2022-03-23 DIAGNOSIS — R6520 Severe sepsis without septic shock: Secondary | ICD-10-CM

## 2022-03-23 DIAGNOSIS — N179 Acute kidney failure, unspecified: Secondary | ICD-10-CM | POA: Diagnosis not present

## 2022-03-23 DIAGNOSIS — A419 Sepsis, unspecified organism: Secondary | ICD-10-CM | POA: Diagnosis present

## 2022-03-23 DIAGNOSIS — J9601 Acute respiratory failure with hypoxia: Secondary | ICD-10-CM | POA: Diagnosis not present

## 2022-03-23 DIAGNOSIS — J189 Pneumonia, unspecified organism: Secondary | ICD-10-CM | POA: Diagnosis not present

## 2022-03-23 LAB — BASIC METABOLIC PANEL
Anion gap: 10 (ref 5–15)
BUN: 8 mg/dL (ref 6–20)
CO2: 24 mmol/L (ref 22–32)
Calcium: 8.6 mg/dL — ABNORMAL LOW (ref 8.9–10.3)
Chloride: 105 mmol/L (ref 98–111)
Creatinine, Ser: 0.79 mg/dL (ref 0.61–1.24)
GFR, Estimated: >60 mL/min (ref >60)
Glucose, Bld: 92 mg/dL (ref 70–99)
Potassium: 3.5 mmol/L (ref 3.5–5.1)
Sodium: 139 mmol/L (ref 135–145)

## 2022-03-23 LAB — CBC
HCT: 32.4 % — ABNORMAL LOW (ref 39.0–52.0)
Hemoglobin: 11.3 g/dL — ABNORMAL LOW (ref 13.0–17.0)
MCH: 31.6 pg (ref 26.0–34.0)
MCHC: 34.9 g/dL (ref 30.0–36.0)
MCV: 90.5 fL (ref 80.0–100.0)
Platelets: 274 10*3/uL (ref 150–400)
RBC: 3.58 MIL/uL — ABNORMAL LOW (ref 4.22–5.81)
RDW: 12.3 % (ref 11.5–15.5)
WBC: 11.7 10*3/uL — ABNORMAL HIGH (ref 4.0–10.5)
nRBC: 0 % (ref 0.0–0.2)

## 2022-03-23 LAB — HIV ANTIBODY (ROUTINE TESTING W REFLEX): HIV Screen 4th Generation wRfx: NONREACTIVE

## 2022-03-23 LAB — LEGIONELLA PNEUMOPHILA SEROGP 1 UR AG: L. pneumophila Serogp 1 Ur Ag: NEGATIVE

## 2022-03-23 LAB — PROCALCITONIN: Procalcitonin: <0.1 ng/mL

## 2022-03-23 LAB — MAGNESIUM: Magnesium: 1.9 mg/dL (ref 1.7–2.4)

## 2022-03-23 MED ORDER — IPRATROPIUM-ALBUTEROL 0.5-2.5 (3) MG/3ML IN SOLN
3.0000 mL | RESPIRATORY_TRACT | Status: DC
Start: 2022-03-23 — End: 2022-03-24
  Administered 2022-03-23 – 2022-03-24 (×5): 3 mL via RESPIRATORY_TRACT
  Filled 2022-03-23 (×5): qty 3

## 2022-03-23 MED ORDER — METHYLPREDNISOLONE SODIUM SUCC 125 MG IJ SOLR
120.0000 mg | Freq: Every day | INTRAMUSCULAR | Status: DC
Start: 2022-03-23 — End: 2022-03-24
  Administered 2022-03-23 – 2022-03-24 (×2): 120 mg via INTRAVENOUS
  Filled 2022-03-23 (×2): qty 2

## 2022-03-23 MED ORDER — AZITHROMYCIN 500 MG PO TABS
500.0000 mg | ORAL_TABLET | Freq: Every day | ORAL | Status: DC
  Administered 2022-03-23 – 2022-03-25 (×3): 500 mg via ORAL
  Filled 2022-03-23 (×3): qty 1

## 2022-03-23 NOTE — Progress Notes (Addendum)
Progress Note    FLORIAN CHAUCA  KZL:[id number] DOB: 01-Mar-1966  DOA: 03/21/2022 PCP: [facility]      Brief Narrative:    Medical records reviewed and are as summarized below:  [name] is a 56 y.o. male with medical history significant for bipolar disorder, depression, anxiety, chronic pain, hypertension, substance use disorder, was brought to the hospital because of cough, shortness of breath, lethargy and strange behavior.  When EMS arrived, he was lethargic, confused, hypotensive and hypoxic.   He was admitted to the hospital for severe sepsis secondary to multifocal pneumonia complicated by acute hypoxic respiratory failure, AKI and acute metabolic encephalopathy.    Assessment/Plan:   Principal Problem:   Severe sepsis (HCC) Active Problems:   Multifocal pneumonia   Bipolar disorder (HCC)   Acute respiratory failure with hypoxia (HCC)   AKI (acute kidney injury) (HCC)   Chronic pain   Anxiety    Body mass index is 26.61 kg/m.   Severe sepsis secondary to multifocal pneumonia: Continue empiric IV antibiotics.  Follow-up blood cultures.  Strep pneumo antigen was negative.  Legionella urine antigen is pending.  Diffuse wheezing, bronchitis: Start IV steroids.  Continue bronchodilators.  Sinus tachycardia: Monitor heart rate closely.  Hypokalemia and hypomagnesemia: Improved  Tobacco use disorder: Counseled to quit smoking cigarettes.  Acute hypoxic respiratory failure, AKI, acute metabolic encephalopathy: Resolved  Other comorbidities include chronic pain, bipolar disorder, depression, anxiety   Diet Order             Diet regular Room service appropriate? Yes; Fluid consistency: Thin  Diet effective now                            Consultants: None  Procedures: None    Medications:    ALPRAZolam  1 mg Oral TID   azithromycin  500 mg Oral Daily   enoxaparin (LOVENOX) injection  40 mg Subcutaneous Daily    fluticasone  2 spray Each Nare Daily   gabapentin  100 mg Oral TID   ipratropium-albuterol  3 mL Nebulization Q4H WA   lamoTRIgine  150 mg Oral QHS   methylPREDNISolone (SOLU-MEDROL) injection  120 mg Intravenous Daily   nicotine  21 mg Transdermal Daily   pantoprazole  20 mg Oral Daily   QUEtiapine  450 mg Oral QHS   sertraline  100 mg Oral Daily   simvastatin  40 mg Oral q1800   sodium chloride flush  3 mL Intravenous Q12H   tamsulosin  0.4 mg Oral QHS   zolpidem  10 mg Oral QHS   Continuous Infusions:  cefTRIAXone (ROCEPHIN)  IV 2 g (03/23/22 1002)     Anti-infectives (From admission, onward)    Start     Dose/Rate Route Frequency Ordered Stop   03/23/22 1100  azithromycin (ZITHROMAX) tablet 500 mg        500 mg Oral Daily 03/23/22 1001 03/27/22 0959   03/22/22 2200  vancomycin (VANCOREADY) IVPB 1500 mg/300 mL  Status:  Discontinued        1,500 mg 150 mL/hr over 120 Minutes Intravenous Every 24 hours 03/22/22 0336 03/22/22 0623   03/22/22 1000  ceFEPIme (MAXIPIME) 2 g in sodium chloride 0.9 % 100 mL IVPB  Status:  Discontinued        2 g 200 mL/hr over 30 Minutes Intravenous Every 12 hours 03/22/22 0336 03/22/22 0623   03/22/22 1000  cefTRIAXone (ROCEPHIN)  2 g in sodium chloride 0.9 % 100 mL IVPB        2 g 200 mL/hr over 30 Minutes Intravenous Every 24 hours 03/22/22 0623 03/27/22 0959   03/22/22 0800  azithromycin (ZITHROMAX) 500 mg in sodium chloride 0.9 % 250 mL IVPB  Status:  Discontinued        500 mg 250 mL/hr over 60 Minutes Intravenous Every 24 hours 03/22/22 0623 03/23/22 1001   03/22/22 0215  vancomycin (VANCOCIN) IVPB 1000 mg/200 mL premix  Status:  Discontinued        1,000 mg 200 mL/hr over 60 Minutes Intravenous  Once 03/22/22 0200 03/22/22 0202   03/22/22 0215  ceFEPIme (MAXIPIME) 2 g in sodium chloride 0.9 % 100 mL IVPB        2 g 200 mL/hr over 30 Minutes Intravenous  Once 03/22/22 0200 03/22/22 0332   03/22/22 0215  vancomycin (VANCOREADY) IVPB 1500  mg/300 mL        1,500 mg 150 mL/hr over 120 Minutes Intravenous  Once 03/22/22 0202 03/22/22 8938              Family Communication/Anticipated D/C date and plan/Code Status   DVT prophylaxis: enoxaparin (LOVENOX) injection 40 mg Start: 03/22/22 1000     Code Status: Full Code  Family Communication: Plan discussed with his wife at the bedside Disposition Plan: Plan to discharge home tomorrow   Status is: Inpatient Remains inpatient appropriate because: IV antibiotics for multifocal pneumonia       Subjective:   Interval events noted.  He complains of cough, wheezing and shortness of breath  Objective:    Vitals:   03/23/22 0722 03/23/22 1043 03/23/22 1050 03/23/22 1212  BP: (!) 151/88   (!) 145/86  Pulse: (!) 109  (!) 114 (!) 112  Resp: 20  (!) 28 20  Temp: 99.1 F (37.3 C)   100 F (37.8 C)  TempSrc: Oral   Oral  SpO2: 91% 90% 92% 96%  Weight:      Height:       No data found.   Intake/Output Summary (Last 24 hours) at 03/23/2022 1320 Last data filed at 03/23/2022 0800 Gross per 24 hour  Intake 590 ml  Output --  Net 590 ml   Filed Weights   03/21/22 2332  Weight: 79.4 kg    Exam:  GEN: NAD SKIN: No rash EYES: EOMI ENT: MMM CV: RRR,, tachycardic PULM: Creased air entry bilaterally, diffuse expiratory wheezing ABD: soft, obese, NT, +BS CNS: AAO x 3, non focal EXT: No edema or tenderness        Data Reviewed:   I have personally reviewed following labs and imaging studies:  Labs: Labs show the following:   Basic Metabolic Panel: Recent Labs  Lab 03/21/22 2351 03/21/22 2358 03/22/22 0638 03/23/22 0503  NA 134* 134* 136 139  K 4.1 3.3* 3.5 3.5  CL 99  --  103 105  CO2 25  --  25 24  GLUCOSE 107*  --  110* 92  BUN 9  --  10 8  CREATININE 1.58*  --  1.26* 0.79  CALCIUM 8.8*  --  8.4* 8.6*  MG  --   --  1.6* 1.9   GFR Estimated Creatinine Clearance: 102.1 mL/min (by C-G formula based on SCr of 0.79 mg/dL). Liver  Function Tests: Recent Labs  Lab 03/21/22 2351 03/22/22 0638  AST 37 22  ALT 15 15  ALKPHOS 83 67  BILITOT 1.7* 0.7  PROT 6.4* 5.7*  ALBUMIN 3.2* 2.6*   No results for input(s): "LIPASE", "AMYLASE" in the last 168 hours. Recent Labs  Lab 03/22/22 0459  AMMONIA 33   Coagulation profile Recent Labs  Lab 03/21/22 2351  INR 1.1    CBC: Recent Labs  Lab 03/21/22 2351 03/21/22 2358 03/23/22 0503  WBC 15.1*  --  11.7*  NEUTROABS 11.5*  --   --   HGB 11.8* 11.6* 11.3*  HCT 35.1* 34.0* 32.4*  MCV 93.4  --  90.5  PLT 228  --  274   Cardiac Enzymes: No results for input(s): "CKTOTAL", "CKMB", "CKMBINDEX", "TROPONINI" in the last 168 hours. BNP (last 3 results) No results for input(s): "PROBNP" in the last 8760 hours. CBG: Recent Labs  Lab 03/21/22 2340  GLUCAP 111*   D-Dimer: No results for input(s): "DDIMER" in the last 72 hours. Hgb A1c: No results for input(s): "HGBA1C" in the last 72 hours. Lipid Profile: No results for input(s): "CHOL", "HDL", "LDLCALC", "TRIG", "CHOLHDL", "LDLDIRECT" in the last 72 hours. Thyroid function studies: No results for input(s): "TSH", "T4TOTAL", "T3FREE", "THYROIDAB" in the last 72 hours.  Invalid input(s): "FREET3" Anemia work up: No results for input(s): "VITAMINB12", "FOLATE", "FERRITIN", "TIBC", "IRON", "RETICCTPCT" in the last 72 hours. Sepsis Labs: Recent Labs  Lab 03/21/22 2351 03/22/22 0638 03/23/22 0503  PROCALCITON  --  <0.10 <0.10  WBC 15.1*  --  11.7*  LATICACIDVEN 1.4  --   --     Microbiology Recent Results (from the past 240 hour(s))  Expectorated Sputum Assessment w Gram Stain, Rflx to Resp Cult     Status: None (Preliminary result)   Collection Time: 03/22/22  6:00 AM   Specimen: Expectorated Sputum  Result Value Ref Range Status   Specimen Description EXPECTORATED SPUTUM  Final   Special Requests NONE  Final   Sputum evaluation   Final    THIS SPECIMEN IS ACCEPTABLE FOR SPUTUM CULTURE Performed  at [facility], [place] [address], [place], [place] [address]    Report Status PENDING  Incomplete  Culture, Respiratory w Gram Stain     Status: None (Preliminary result)   Collection Time: 03/22/22  6:00 AM  Result Value Ref Range Status   Specimen Description EXPECTORATED SPUTUM  Final   Special Requests NONE Reflexed from [id number]  Final   Gram Stain   Final    FEW SQUAMOUS EPITHELIAL CELLS PRESENT FEW WBC SEEN RARE GRAM POSITIVE COCCI RARE YEAST FEW GRAM POSITIVE RODS Performed at [facility], [place] [address] [place], [place] [address]    Culture PENDING  Incomplete   Report Status PENDING  Incomplete    Procedures and diagnostic studies:  DG Chest 2 View  Result Date: 03/22/2022 CLINICAL DATA:  Encounter for hypoxia. EXAM: CHEST - 2 VIEW COMPARISON:  Portable chest 01/03/2021. FINDINGS: The cardiac size is upper limit of normal. There is a stable mediastinal configuration. Central vessels are normal caliber. Faint patchy hazy increased opacities noted in the right-greater-than-left upper lobes concerning for pneumonitis, with increased central bronchial thickening possibly indicating bronchopneumonia. The mid and lower lung fields are generally clear. No pleural effusion is seen. Mild upper thoracic levoscoliosis is seen with interval wedging deformity and kyphoplasty cement at T5 and T6. IMPRESSION: 1. Increased findings of central bronchitis with increased opacity in both upper lobes concerning for multilobar pneumonia. 2. If pneumonia is clinically suspected, a follow-up study is recommended after treatment to ensure clearing. If other airspace disease etiology  is considered, CT or CTA chest is recommended. 3. New finding of wedge compression fracture deformities with kyphoplasty at T5 and 6. Electronically Signed   By: Telford Nab M.D.   On: 03/22/2022 01:15   CT HEAD WO CONTRAST  Result Date: 03/22/2022 CLINICAL DATA:  Delirium. EXAM: CT HEAD WITHOUT CONTRAST  TECHNIQUE: Contiguous axial images were obtained from the base of the skull through the vertex without intravenous contrast. RADIATION DOSE REDUCTION: This exam was performed according to the departmental dose-optimization program which includes automated exposure control, adjustment of the mA and/or kV according to patient size and/or use of iterative reconstruction technique. COMPARISON:  Head CT 11/02/2020. FINDINGS: Brain: No evidence of acute infarction, hemorrhage, hydrocephalus, extra-axial collection or mass lesion/mass effect. Slight cerebral cortical atrophy is again noted. No findings of significant small-vessel disease. Vascular: There are scattered calcifications of the carotid siphons. There are no hyperdense central vessels. Skull: The calvarium skull base and orbits are intact. Sinuses/Orbits: No focal abnormality of the orbital contents. There is increased circumferential membrane disease and fluid in the right maxillary sinus. There is increased patchy opacification of the bilateral ethmoid air cells. There is mild increased membrane thickening without fluid in the left maxillary and bilateral sphenoid sinus. Frontal sinus and visualized mastoid air cells are clear. Other: None. IMPRESSION: 1. Increased bilateral ethmoid sinus opacification, and increased fluid and membrane disease in the right maxillary sinus, correlate clinically for acute on chronic sinusitis. 2. No acute intracranial CT findings or intracranial interval changes. Electronically Signed   By: Telford Nab M.D.   On: 03/22/2022 01:10               LOS: 1 day   Charde Macfarlane  [facility]   Pager on [email or web address]. If 7PM-7AM, please contact night-coverage at [email or web address]     03/23/2022, 1:20 PM

## 2022-03-23 NOTE — Plan of Care (Signed)
  Problem: Education: Goal: Knowledge of General Education information will improve Description Including pain rating scale, medication(s)/side effects and non-pharmacologic comfort measures Outcome: Progressing   

## 2022-03-24 ENCOUNTER — Inpatient Hospital Stay (HOSPITAL_COMMUNITY): Payer: BLUE CROSS/BLUE SHIELD

## 2022-03-24 DIAGNOSIS — A419 Sepsis, unspecified organism: Secondary | ICD-10-CM | POA: Diagnosis not present

## 2022-03-24 DIAGNOSIS — R6520 Severe sepsis without septic shock: Secondary | ICD-10-CM | POA: Diagnosis not present

## 2022-03-24 LAB — BASIC METABOLIC PANEL
Anion gap: 12 (ref 5–15)
BUN: 8 mg/dL (ref 6–20)
CO2: 24 mmol/L (ref 22–32)
Calcium: 8.9 mg/dL (ref 8.9–10.3)
Chloride: 104 mmol/L (ref 98–111)
Creatinine, Ser: 0.7 mg/dL (ref 0.61–1.24)
GFR, Estimated: >60 mL/min (ref >60)
Glucose, Bld: 168 mg/dL — ABNORMAL HIGH (ref 70–99)
Potassium: 3.6 mmol/L (ref 3.5–5.1)
Sodium: 140 mmol/L (ref 135–145)

## 2022-03-24 LAB — CBC
HCT: 33 % — ABNORMAL LOW (ref 39.0–52.0)
Hemoglobin: 11.3 g/dL — ABNORMAL LOW (ref 13.0–17.0)
MCH: 30.4 pg (ref 26.0–34.0)
MCHC: 34.2 g/dL (ref 30.0–36.0)
MCV: 88.7 fL (ref 80.0–100.0)
Platelets: 336 10*3/uL (ref 150–400)
RBC: 3.72 MIL/uL — ABNORMAL LOW (ref 4.22–5.81)
RDW: 12.3 % (ref 11.5–15.5)
WBC: 16.2 10*3/uL — ABNORMAL HIGH (ref 4.0–10.5)
nRBC: 0 % (ref 0.0–0.2)

## 2022-03-24 LAB — PROCALCITONIN: Procalcitonin: <0.1 ng/mL

## 2022-03-24 MED ORDER — IPRATROPIUM-ALBUTEROL 0.5-2.5 (3) MG/3ML IN SOLN
3.0000 mL | Freq: Three times a day (TID) | RESPIRATORY_TRACT | Status: DC
  Administered 2022-03-25 (×2): 3 mL via RESPIRATORY_TRACT
  Filled 2022-03-24 (×3): qty 3

## 2022-03-24 MED ORDER — METHYLPREDNISOLONE SODIUM SUCC 40 MG IJ SOLR
40.0000 mg | Freq: Three times a day (TID) | INTRAMUSCULAR | Status: DC
Start: 2022-03-24 — End: 2022-03-24

## 2022-03-24 MED ORDER — IOHEXOL 300 MG/ML  SOLN
75.0000 mL | Freq: Once | INTRAMUSCULAR | Status: AC | PRN
  Administered 2022-03-24: 75 mL via INTRAVENOUS

## 2022-03-24 MED ORDER — BUDESONIDE 0.25 MG/2ML IN SUSP
0.2500 mg | Freq: Two times a day (BID) | RESPIRATORY_TRACT | Status: DC
  Administered 2022-03-24 – 2022-03-25 (×2): 0.25 mg via RESPIRATORY_TRACT
  Filled 2022-03-24 (×2): qty 2

## 2022-03-24 MED ORDER — METHYLPREDNISOLONE SODIUM SUCC 125 MG IJ SOLR
125.0000 mg | Freq: Every day | INTRAMUSCULAR | Status: DC
  Administered 2022-03-25: 125 mg via INTRAVENOUS
  Filled 2022-03-24: qty 2

## 2022-03-24 MED ORDER — SODIUM CHLORIDE 0.9 % IV SOLN: INTRAVENOUS | Status: AC

## 2022-03-24 MED ORDER — MONTELUKAST SODIUM 10 MG PO TABS
10.0000 mg | ORAL_TABLET | Freq: Every day | ORAL | Status: DC
  Administered 2022-03-24: 10 mg via ORAL
  Filled 2022-03-24: qty 1

## 2022-03-24 NOTE — Progress Notes (Signed)
Dr. Myna Hidalgo on call for hospitalist team notified to clarify orders for IVF after CT scan.  New order obtained. Ayesha Mohair BSN RN CMSRN 03/24/2022, 10:54 PM

## 2022-03-24 NOTE — Progress Notes (Signed)
Progress Note    [name]  YQI:[id number] DOB: 16-Nov-1965  DOA: 03/21/2022 PCP: [facility]      Brief Narrative:    Medical records reviewed and are as summarized below:  [name] is a 56 y.o. male with medical history significant for bipolar disorder, depression, anxiety, chronic pain, hypertension, substance use disorder, was brought to the hospital because of cough, shortness of breath, lethargy and strange behavior.  When EMS arrived, he was lethargic, confused, hypotensive and hypoxic.   He was admitted to the hospital for severe sepsis secondary to multifocal pneumonia complicated by acute hypoxic respiratory failure, AKI and acute metabolic encephalopathy.  03/24/2022: Patient seen.  Available records reviewed.  Acute kidney injury has resolved.  Respiratory symptoms persist.  Patient still has expiratory wheeze.  History of over 40 pack years.  Occupational has also also endorsed.  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  CT chest with contrast done on 11/09/2020 revealed areas of mild scarring and nodular opacities ranging in size from 2 to 5 mm.  Repeat CT chest in a year recommended.  We will proceed with repeat CT chest with contrast.  Will administer IV fluids prior to contrast.  We will change IV Solu-Medrol to 40 Mg every 8 hourly.  We will start patient on nebs Pulmicort, flutter valve therapy, incentive spirometry and Singulair 10 Mg p.o. once daily.  Peak flow daily.  Patient will likely follow-up with pulmonary team on discharge.  Meanwhile, with complete course of antibiotics for pneumonia.  Further management will depend on above.  Assessment/Plan:   Principal Problem:   Severe sepsis (HCC) Active Problems:   Multifocal pneumonia   Bipolar disorder (HCC)   Acute respiratory failure with hypoxia (HCC)   AKI (acute kidney injury) (HCC)   Chronic pain   Anxiety    Body mass index is 26.61 kg/m.   Severe sepsis  secondary to multifocal pneumonia: Continue empiric IV antibiotics.  Follow-up blood cultures.  Strep pneumo antigen was negative.  Legionella urine antigen is pending. 03/24/2022: See above documentation.  Diffuse wheezing, bronchitis: Start IV steroids.  Continue bronchodilators. 03/24/2022: See above documentation.  Sinus tachycardia:  -Likely multifactorial. -Patient is on nebs DuoNeb. -Continue to monitor closely.    Hypokalemia: -Potassium is 3.6 today.  Hypomagnesemia: -Last magnesium level checked on 03/23/2022 was 1.9.    Tobacco use disorder/lung nodules:  -Repeat CT chest with contrast. -Counseled to quit smoking cigarettes.  Acute hypoxic respiratory failure: -Continue IV Solu-Medrol, nebs DuoNeb, nebs Pulmicort. -Start Singulair. -Repeat CT chest with contrast. -Start flutter valve device therapy. -Start incentive spirometry. -Peak flow daily. -Follow-up with pulmonary team on discharge.  Acute kidney injury: -Resolved. -Prophylaxis for contrast associated nephropathy as patient will get CT chest with contrast. -Continue to monitor renal function and electrolytes closely.  Acute encephalopathy: -Metabolic versus toxic versus combined toxic and metabolic. -Resolved significantly.  Other comorbidities include chronic pain, bipolar disorder, depression, anxiety   Diet Order             Diet regular Room service appropriate? Yes; Fluid consistency: Thin  Diet effective now                   Consultants: None  Procedures: None    Medications:    ALPRAZolam  1 mg Oral TID   azithromycin  500 mg Oral Daily   budesonide (PULMICORT) nebulizer solution  0.25 mg Nebulization BID   enoxaparin (LOVENOX)  injection  40 mg Subcutaneous Daily   fluticasone  2 spray Each Nare Daily   gabapentin  100 mg Oral TID   ipratropium-albuterol  3 mL Nebulization TID   lamoTRIgine  150 mg Oral QHS   [START ON 03/25/2022] methylPREDNISolone (SOLU-MEDROL) injection   125 mg Intravenous Daily   montelukast  10 mg Oral QHS   nicotine  21 mg Transdermal Daily   pantoprazole  20 mg Oral Daily   QUEtiapine  450 mg Oral QHS   sertraline  100 mg Oral Daily   simvastatin  40 mg Oral q1800   sodium chloride flush  3 mL Intravenous Q12H   tamsulosin  0.4 mg Oral QHS   zolpidem  10 mg Oral QHS   Continuous Infusions:  cefTRIAXone (ROCEPHIN)  IV 2 g (03/24/22 0918)     Anti-infectives (From admission, onward)    Start     Dose/Rate Route Frequency Ordered Stop   03/23/22 1100  azithromycin (ZITHROMAX) tablet 500 mg        500 mg Oral Daily 03/23/22 1001 03/27/22 0959   03/22/22 2200  vancomycin (VANCOREADY) IVPB 1500 mg/300 mL  Status:  Discontinued        1,500 mg 150 mL/hr over 120 Minutes Intravenous Every 24 hours 03/22/22 0336 03/22/22 0623   03/22/22 1000  ceFEPIme (MAXIPIME) 2 g in sodium chloride 0.9 % 100 mL IVPB  Status:  Discontinued        2 g 200 mL/hr over 30 Minutes Intravenous Every 12 hours 03/22/22 0336 03/22/22 0623   03/22/22 1000  cefTRIAXone (ROCEPHIN) 2 g in sodium chloride 0.9 % 100 mL IVPB        2 g 200 mL/hr over 30 Minutes Intravenous Every 24 hours 03/22/22 0623 03/27/22 0959   03/22/22 0800  azithromycin (ZITHROMAX) 500 mg in sodium chloride 0.9 % 250 mL IVPB  Status:  Discontinued        500 mg 250 mL/hr over 60 Minutes Intravenous Every 24 hours 03/22/22 0623 03/23/22 1001   03/22/22 0215  vancomycin (VANCOCIN) IVPB 1000 mg/200 mL premix  Status:  Discontinued        1,000 mg 200 mL/hr over 60 Minutes Intravenous  Once 03/22/22 0200 03/22/22 0202   03/22/22 0215  ceFEPIme (MAXIPIME) 2 g in sodium chloride 0.9 % 100 mL IVPB        2 g 200 mL/hr over 30 Minutes Intravenous  Once 03/22/22 0200 03/22/22 0332   03/22/22 0215  vancomycin (VANCOREADY) IVPB 1500 mg/300 mL        1,500 mg 150 mL/hr over 120 Minutes Intravenous  Once 03/22/22 0202 03/22/22 9373              Family Communication/Anticipated D/C date  and plan/Code Status   DVT prophylaxis: enoxaparin (LOVENOX) injection 40 mg Start: 03/22/22 1000     Code Status: Full Code  Family Communication: Plan discussed with his wife at the bedside Disposition Plan: Plan to discharge home tomorrow   Status is: Inpatient Remains inpatient appropriate because: IV antibiotics for multifocal pneumonia       Subjective:   Interval events noted.  He complains of cough, wheezing and shortness of breath  Objective:    Vitals:   03/24/22 0909 03/24/22 1141 03/24/22 1212 03/24/22 1616  BP: 127/79 127/72  131/85  Pulse: (!) 109 (!) 112  (!) 110  Resp: '20 20  14  '$ Temp: 98.4 F (36.9 C) 98.5 F (36.9 C)  98.3 F (  36.8 C)  TempSrc: Oral Oral  Oral  SpO2: 92% 91% 90% 92%  Weight:      Height:       No data found.   Intake/Output Summary (Last 24 hours) at 03/24/2022 1628 Last data filed at 03/24/2022 1300 Gross per 24 hour  Intake 540 ml  Output --  Net 540 ml    Filed Weights   03/21/22 2332  Weight: 79.4 kg    Exam:  GEN: Patient is not in any distress.  Patient is awake and alert. HEENT: Mild pallor.  No jaundice. Neck: Supple. Lungs: Decreased air entry.  Expiratory wheeze. CVS: S1-S2. Abdomen: Soft and nontender. Neuro: Awake and alert.  Patient moves all extremities. Extremities: No leg edema.     Data Reviewed:   I have personally reviewed following labs and imaging studies:  Labs: Labs show the following:   Basic Metabolic Panel: Recent Labs  Lab 03/21/22 2351 03/21/22 2358 03/22/22 0638 03/23/22 0503 03/24/22 0311  NA 134* 134* 136 139 140  K 4.1 3.3* 3.5 3.5 3.6  CL 99  --  103 105 104  CO2 25  --  '25 24 24  '$ GLUCOSE 107*  --  110* 92 168*  BUN 9  --  '10 8 8  '$ CREATININE 1.58*  --  1.26* 0.79 0.70  CALCIUM 8.8*  --  8.4* 8.6* 8.9  MG  --   --  1.6* 1.9  --     GFR Estimated Creatinine Clearance: 102.1 mL/min (by C-G formula based on SCr of 0.7 mg/dL). Liver Function Tests: Recent Labs   Lab 03/21/22 2351 03/22/22 0638  AST 37 22  ALT 15 15  ALKPHOS 83 67  BILITOT 1.7* 0.7  PROT 6.4* 5.7*  ALBUMIN 3.2* 2.6*    No results for input(s): "LIPASE", "AMYLASE" in the last 168 hours. Recent Labs  Lab 03/22/22 0459  AMMONIA 33    Coagulation profile Recent Labs  Lab 03/21/22 2351  INR 1.1     CBC: Recent Labs  Lab 03/21/22 2351 03/21/22 2358 03/23/22 0503 03/24/22 0311  WBC 15.1*  --  11.7* 16.2*  NEUTROABS 11.5*  --   --   --   HGB 11.8* 11.6* 11.3* 11.3*  HCT 35.1* 34.0* 32.4* 33.0*  MCV 93.4  --  90.5 88.7  PLT 228  --  274 336    Cardiac Enzymes: No results for input(s): "CKTOTAL", "CKMB", "CKMBINDEX", "TROPONINI" in the last 168 hours. BNP (last 3 results) No results for input(s): "PROBNP" in the last 8760 hours. CBG: Recent Labs  Lab 03/21/22 2340  GLUCAP 111*    D-Dimer: No results for input(s): "DDIMER" in the last 72 hours. Hgb A1c: No results for input(s): "HGBA1C" in the last 72 hours. Lipid Profile: No results for input(s): "CHOL", "HDL", "LDLCALC", "TRIG", "CHOLHDL", "LDLDIRECT" in the last 72 hours. Thyroid function studies: No results for input(s): "TSH", "T4TOTAL", "T3FREE", "THYROIDAB" in the last 72 hours.  Invalid input(s): "FREET3" Anemia work up: No results for input(s): "VITAMINB12", "FOLATE", "FERRITIN", "TIBC", "IRON", "RETICCTPCT" in the last 72 hours. Sepsis Labs: Recent Labs  Lab 03/21/22 2351 03/22/22 0638 03/23/22 0503 03/24/22 0311  PROCALCITON  --  <0.10 <0.10 <0.10  WBC 15.1*  --  11.7* 16.2*  LATICACIDVEN 1.4  --   --   --      Microbiology Recent Results (from the past 240 hour(s))  Blood culture (routine x 2)     Status: None (Preliminary result)   Collection  Time: 03/21/22 11:45 PM   Specimen: BLOOD  Result Value Ref Range Status   Specimen Description BLOOD SITE NOT SPECIFIED  Final   Special Requests   Final    BOTTLES DRAWN AEROBIC AND ANAEROBIC Blood Culture adequate volume    Culture   Final    NO GROWTH 2 DAYS Performed at [facility], [address], [place], [place] [address]    Report Status PENDING  Incomplete  Blood culture (routine x 2)     Status: None (Preliminary result)   Collection Time: 03/21/22 11:59 PM   Specimen: BLOOD  Result Value Ref Range Status   Specimen Description BLOOD LEFT ANTECUBITAL  Final   Special Requests   Final    BOTTLES DRAWN AEROBIC AND ANAEROBIC Blood Culture adequate volume   Culture   Final    NO GROWTH 2 DAYS Performed at [facility], [place] [address], [place], [place] [address]    Report Status PENDING  Incomplete  Expectorated Sputum Assessment w Gram Stain, Rflx to Resp Cult     Status: None (Preliminary result)   Collection Time: 03/22/22  6:00 AM   Specimen: Expectorated Sputum  Result Value Ref Range Status   Specimen Description EXPECTORATED SPUTUM  Final   Special Requests NONE  Final   Sputum evaluation   Final    THIS SPECIMEN IS ACCEPTABLE FOR SPUTUM CULTURE Performed at [facility], [place] [address], [place], [place] [address]    Report Status PENDING  Incomplete  Culture, Respiratory w Gram Stain     Status: None (Preliminary result)   Collection Time: 03/22/22  6:00 AM  Result Value Ref Range Status   Specimen Description EXPECTORATED SPUTUM  Final   Special Requests NONE Reflexed from [id number]  Final   Gram Stain   Final    FEW SQUAMOUS EPITHELIAL CELLS PRESENT FEW WBC SEEN RARE GRAM POSITIVE COCCI RARE YEAST FEW GRAM POSITIVE RODS    Culture   Final    CULTURE REINCUBATED FOR BETTER GROWTH Performed at [facility], [place] [address], [place],  [address]    Report Status PENDING  Incomplete    Procedures and diagnostic studies:  No results found.     LOS: 2 days   [facility]   Pager on [email or web address]. If 7PM-7AM, please contact night-coverage at [email or web address]     03/24/2022, 4:28 PM

## 2022-03-25 LAB — EXPECTORATED SPUTUM ASSESSMENT W GRAM STAIN, RFLX TO RESP C

## 2022-03-25 LAB — CBC
HCT: 31.2 % — ABNORMAL LOW (ref 39.0–52.0)
Hemoglobin: 10.8 g/dL — ABNORMAL LOW (ref 13.0–17.0)
MCH: 30.9 pg (ref 26.0–34.0)
MCHC: 34.6 g/dL (ref 30.0–36.0)
MCV: 89.4 fL (ref 80.0–100.0)
Platelets: 350 10*3/uL (ref 150–400)
RBC: 3.49 MIL/uL — ABNORMAL LOW (ref 4.22–5.81)
RDW: 12.6 % (ref 11.5–15.5)
WBC: 29.5 10*3/uL — ABNORMAL HIGH (ref 4.0–10.5)
nRBC: 0 % (ref 0.0–0.2)

## 2022-03-25 LAB — RENAL FUNCTION PANEL
Albumin: 2.3 g/dL — ABNORMAL LOW (ref 3.5–5.0)
Anion gap: 8 (ref 5–15)
BUN: 18 mg/dL (ref 6–20)
CO2: 25 mmol/L (ref 22–32)
Calcium: 8.6 mg/dL — ABNORMAL LOW (ref 8.9–10.3)
Chloride: 109 mmol/L (ref 98–111)
Creatinine, Ser: 0.72 mg/dL (ref 0.61–1.24)
GFR, Estimated: >60 mL/min (ref >60)
Glucose, Bld: 133 mg/dL — ABNORMAL HIGH (ref 70–99)
Phosphorus: 3.9 mg/dL (ref 2.5–4.6)
Potassium: 4 mmol/L (ref 3.5–5.1)
Sodium: 142 mmol/L (ref 135–145)

## 2022-03-25 MED ORDER — PREDNISONE 10 MG PO TABS: ORAL_TABLET | ORAL | 0 refills | Status: DC

## 2022-03-25 MED ORDER — BUDESONIDE-FORMOTEROL FUMARATE 80-4.5 MCG/ACT IN AERO: 2.0000 | INHALATION_SPRAY | Freq: Two times a day (BID) | RESPIRATORY_TRACT | 12 refills | Status: DC

## 2022-03-25 MED ORDER — AZITHROMYCIN 500 MG PO TABS: 500.0000 mg | ORAL_TABLET | Freq: Every day | ORAL | 0 refills | Status: AC

## 2022-03-25 MED ORDER — NICOTINE 21 MG/24HR TD PT24: 21.0000 mg | MEDICATED_PATCH | Freq: Every day | TRANSDERMAL | 0 refills | Status: DC

## 2022-03-25 MED ORDER — CEFDINIR 300 MG PO CAPS: 300.0000 mg | ORAL_CAPSULE | Freq: Two times a day (BID) | ORAL | 0 refills | Status: AC

## 2022-03-25 MED ORDER — MONTELUKAST SODIUM 10 MG PO TABS
10.0000 mg | ORAL_TABLET | Freq: Every day | ORAL | 1 refills | Status: DC
Start: 2022-03-25 — End: 2022-03-25

## 2022-03-25 MED ORDER — AZITHROMYCIN 500 MG PO TABS
500.0000 mg | ORAL_TABLET | Freq: Every day | ORAL | 0 refills | Status: DC
Start: 2022-03-26 — End: 2022-03-25

## 2022-03-25 MED ORDER — CEFDINIR 300 MG PO CAPS: 300.0000 mg | ORAL_CAPSULE | Freq: Two times a day (BID) | ORAL | 0 refills | Status: DC

## 2022-03-25 MED ORDER — MONTELUKAST SODIUM 10 MG PO TABS: 10.0000 mg | ORAL_TABLET | Freq: Every day | ORAL | 1 refills | Status: AC

## 2022-03-25 NOTE — Plan of Care (Signed)
  Problem: Education: Goal: Knowledge of General Education information will improve Description: Including pain rating scale, medication(s)/side effects and non-pharmacologic comfort measures Outcome: Adequate for Discharge   Problem: Health Behavior/Discharge Planning: Goal: Ability to manage health-related needs will improve Outcome: Adequate for Discharge   Problem: Clinical Measurements: Goal: Ability to maintain clinical measurements within normal limits will improve Outcome: Adequate for Discharge Goal: Will remain free from infection Outcome: Adequate for Discharge Goal: Diagnostic test results will improve Outcome: Adequate for Discharge Goal: Respiratory complications will improve Outcome: Adequate for Discharge Goal: Cardiovascular complication will be avoided Outcome: Adequate for Discharge   Problem: Activity: Goal: Risk for activity intolerance will decrease Outcome: Adequate for Discharge   Problem: Nutrition: Goal: Adequate nutrition will be maintained Outcome: Adequate for Discharge   Problem: Coping: Goal: Level of anxiety will decrease Outcome: Adequate for Discharge   Problem: Elimination: Goal: Will not experience complications related to bowel motility Outcome: Adequate for Discharge Goal: Will not experience complications related to urinary retention Outcome: Adequate for Discharge   Problem: Pain Managment: Goal: General experience of comfort will improve Outcome: Adequate for Discharge   Problem: Safety: Goal: Ability to remain free from injury will improve Outcome: Adequate for Discharge   Problem: Skin Integrity: Goal: Risk for impaired skin integrity will decrease Outcome: Adequate for Discharge   Problem: Education: Goal: Expressions of having a comfortable level of knowledge regarding the disease process will increase Outcome: Adequate for Discharge   Problem: Coping: Goal: Ability to adjust to condition or change in health will  improve Outcome: Adequate for Discharge Goal: Ability to identify appropriate support needs will improve Outcome: Adequate for Discharge   Problem: Health Behavior/Discharge Planning: Goal: Compliance with prescribed medication regimen will improve Outcome: Adequate for Discharge   Problem: Medication: Goal: Risk for medication side effects will decrease Outcome: Adequate for Discharge   Problem: Clinical Measurements: Goal: Complications related to the disease process, condition or treatment will be avoided or minimized Outcome: Adequate for Discharge Goal: Diagnostic test results will improve Outcome: Adequate for Discharge   Problem: Safety: Goal: Verbalization of understanding the information provided will improve Outcome: Adequate for Discharge   Problem: Self-Concept: Goal: Level of anxiety will decrease Outcome: Adequate for Discharge Goal: Ability to verbalize feelings about condition will improve Outcome: Adequate for Discharge   Problem: Activity: Goal: Ability to tolerate increased activity will improve Outcome: Adequate for Discharge   Problem: Clinical Measurements: Goal: Ability to maintain a body temperature in the normal range will improve Outcome: Adequate for Discharge   Problem: Respiratory: Goal: Ability to maintain adequate ventilation will improve Outcome: Adequate for Discharge Goal: Ability to maintain a clear airway will improve Outcome: Adequate for Discharge

## 2022-03-25 NOTE — Discharge Summary (Signed)
Physician Discharge Summary   Patient: [name] MRN: [id number] DOB: 1965-12-31  Admit date:     03/21/2022  Discharge date: 03/25/2022  Discharge Physician: Bonnell Public   PCP: [facility]   Recommendations at discharge:    Follow up with PCP within 1 week of discharge PCP to consider referring patient to a Pulmonologist.  Patient may have undiagnosed COPD. Quit cigarettes use.  Discharge Diagnoses: Principal Problem:   Severe sepsis ([place]) Active Problems:   Multifocal pneumonia   Bipolar disorder ([facility])   Acute respiratory failure with hypoxia ([facility])   AKI (acute kidney injury) ([place])   Chronic pain   Anxiety  Hospital Course: Patient is a 56 year old male with past medical history significant for bipolar disorder, depression, anxiety, chronic pain, hypertension, substance use disorder, was brought to the hospital because of cough, shortness of breath, lethargy and strange behavior.  When EMS arrived, he was lethargic, confused, hypotensive and hypoxic.  Patient was admitted and managed for severe sepsis secondary to multifocal pneumonia complicated by acute hypoxic respiratory failure, AKI and acute metabolic encephalopathy.     Acute kidney injury has resolved.  History of over 40 pack years.  Occupational hazard was also also endorsed.  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  CT chest with contrast done on 11/09/2020 revealed areas of mild scarring and nodular opacities ranging in size from 2 to 5 mm.  Repeat CT chest in a year recommended.  CT chest with contrast was repeated.  Patient was also managed with IV steroids, complete course of antibiotics for pneumonia, Singulair and nebulizer treatment.  Patient has improved significantly and is eager to be discharged back home.  Patient will follow-up primary care provider and pulmonary team on discharge.  Severe sepsis secondary to multifocal pneumonia: Continue empiric IV antibiotics.  Follow-up  blood cultures.  Strep pneumo antigen was negative.  Legionella urine antigen is pending. 03/24/2022: See above documentation.   Diffuse wheezing, bronchitis: Start IV steroids.  Continue bronchodilators. 03/24/2022: See above documentation.  Sinus tachycardia:  -Likely multifactorial. -Patient is on nebs DuoNeb. -Continue to monitor closely.     Hypokalemia: -Potassium is 3.6 today.  Hypomagnesemia: -Last magnesium level checked on 03/23/2022 was 1.9.     Tobacco use disorder/lung nodules:  -Repeat CT chest with contrast. -Counseled to quit smoking cigarettes.  Acute hypoxic respiratory failure: -Continue IV Solu-Medrol, nebs DuoNeb, nebs Pulmicort. -Start Singulair. -Repeat CT chest with contrast. -Start flutter valve device therapy. -Start incentive spirometry. -Peak flow daily. -Follow-up with pulmonary team on discharge.   Acute kidney injury: -Resolved. -Prophylaxis for contrast associated nephropathy as patient will get CT chest with contrast. -Continue to monitor renal function and electrolytes closely.  Acute encephalopathy: -Metabolic versus toxic versus combined toxic and metabolic. -Resolved significantly.   Other comorbidities include chronic pain, bipolar disorder, depression, anxiety    Assessment and Plan: No notes have been filed under this hospital service. Service: Hospitalist        Consultants: None Procedures performed: None  Disposition: Home Diet recommendation:  Discharge Diet Orders (From admission, onward)     Start     Ordered   03/25/22 0000  Diet - low sodium heart healthy        03/25/22 1341           Regular diet DISCHARGE MEDICATION: Allergies as of 03/25/2022   No Known Allergies      Medication List     STOP taking these medications  diphenhydrAMINE 25 MG tablet Commonly known as: BENADRYL       TAKE these medications    albuterol 108 (90 Base) MCG/ACT inhaler Commonly known as: ProAir HFA Inhale  1-2 puffs into the lungs every 6 (six) hours as needed for wheezing or shortness of breath.   ALPRAZolam 1 MG tablet Commonly known as: XANAX Take 1 mg by mouth 3 (three) times daily.   azithromycin 500 MG tablet Commonly known as: ZITHROMAX Take 1 tablet (500 mg total) by mouth daily for 3 days. Start taking on: March 26, 2022   B-D 3CC LUER-LOK SYR 22GX1-1/2 22G X 1-1/2" 3 ML Misc Generic drug: SYRINGE-NEEDLE (DISP) 3 ML   budesonide-formoterol 80-4.5 MCG/ACT inhaler Commonly known as: Symbicort Inhale 2 puffs into the lungs 2 (two) times daily.   cefdinir 300 MG capsule Commonly known as: OMNICEF Take 1 capsule (300 mg total) by mouth 2 (two) times daily for 3 days.   fluticasone 50 MCG/ACT nasal spray Commonly known as: FLONASE Place 2 sprays into both nostrils daily.   gabapentin 100 MG capsule Commonly known as: NEURONTIN Take 100 mg by mouth See admin instructions. Take 100 mg 3 times daily, may take an additional 100 mg dose as bedtime as needed for pain   lamoTRIgine 150 MG tablet Commonly known as: LAMICTAL Take 150 mg by mouth at bedtime.   lisinopril 20 MG tablet Commonly known as: ZESTRIL Take 1 tablet (20 mg total) by mouth daily.   methocarbamol 500 MG tablet Commonly known as: Robaxin Take 1 tablet (500 mg total) by mouth 3 (three) times daily as needed for muscle spasms.   montelukast 10 MG tablet Commonly known as: SINGULAIR Take 1 tablet (10 mg total) by mouth at bedtime.   Narcan 4 MG/0.1ML Liqd nasal spray kit Generic drug: naloxone Place 4 mg into the nose as needed (opioid overdose). As directed   nicotine 21 mg/24hr patch Commonly known as: NICODERM CQ - dosed in mg/24 hours Place 1 patch (21 mg total) onto the skin daily. Start taking on: March 26, 2022   oxyCODONE-acetaminophen 10-325 MG tablet Commonly known as: Percocet Take 1 tablet by mouth every 4 (four) hours as needed for pain.   pantoprazole 20 MG tablet Commonly known as:  PROTONIX Take 1 tablet (20 mg total) by mouth daily.   predniSONE 10 MG tablet Commonly known as: DELTASONE Prednisone 60 mg po once daily for 3 days, then 40 mg po daily for 3 days, then 30 mg po daily for 3 days, then 20 mg po once daily for 3 days and then 10 mg po once daily for 3 days and stop.   QUEtiapine 300 MG tablet Commonly known as: SEROQUEL Take 450 mg by mouth at bedtime. Taking 1 & 1/2 tabs = 450 mg   sertraline 100 MG tablet Commonly known as: ZOLOFT Take 100 mg by mouth daily.   sildenafil 20 MG tablet Commonly known as: REVATIO Take 2-5 tablets (40-100 mg total) by mouth daily as needed (for erectile dysfunction.).   simvastatin 40 MG tablet Commonly known as: ZOCOR TAKE 1 TABLET BY MOUTH DAILY AT 6 PM.   tamsulosin 0.4 MG Caps capsule Commonly known as: FLOMAX Take 0.4 mg by mouth at bedtime.   testosterone cypionate 100 MG/ML injection Commonly known as: DEPOTESTOTERONE CYPIONATE Inject 100 mg into the muscle once a week.   zolpidem 10 MG tablet Commonly known as: AMBIEN Take 10 mg by mouth at bedtime.  Discharge Exam: Filed Weights   03/21/22 2332  Weight: 79.4 kg     Condition at discharge: stable  The results of significant diagnostics from this hospitalization (including imaging, microbiology, ancillary and laboratory) are listed below for reference.   Imaging Studies: CT CHEST W CONTRAST  Result Date: 03/24/2022 CLINICAL DATA:  Lung nodules, follow-up exam. EXAM: CT CHEST WITH CONTRAST TECHNIQUE: Multidetector CT imaging of the chest was performed during intravenous contrast administration. RADIATION DOSE REDUCTION: This exam was performed according to the departmental dose-optimization program which includes automated exposure control, adjustment of the mA and/or kV according to patient size and/or use of iterative reconstruction technique. CONTRAST:  28mL OMNIPAQUE IOHEXOL 300 MG/ML  SOLN COMPARISON:  Chest CT, 11/09/2020.  Chest  radiograph, 03/22/2022. FINDINGS: Cardiovascular: Heart is normal in size and configuration. No pericardial effusion. Great vessels are normal in caliber. No aortic atherosclerosis. Mediastinum/Nodes: Normal thyroid. No neck base, mediastinal or hilar masses. No enlarged lymph nodes. Trachea and esophagus are unremarkable. Lungs/Pleura: Trace right pleural effusion. There are bilateral ground-glass lung opacities, seen in all lobes, new compared to the prior CT. Small nodules previously described are either stable or no longer visualized. No new or suspicious nodules. No pneumothorax. Upper Abdomen: No acute findings. Small low-attenuation lesion at the dome of the left lobe of the liver, 8 mm, stable, consistent with a cyst. Musculoskeletal: Compression fractures of T5 and T6 that have been treated with vertebroplasty since the prior chest CT. Slight depressions of the upper endplates of T3 and T4, stable. No acute fracture. No bone lesion. No chest wall mass. IMPRESSION: 1. Hazy bilateral ground-glass lung opacities. Findings consistent with multifocal infection or inflammation. Pulmonary edema is felt less likely, but not excluded. Trace right pleural effusion. 2. Previously seen small lung nodules are either stable or no longer visualized. No new or suspicious nodules. No additional nodule follow-up recommended. Electronically Signed   By: Lajean Manes M.D.   On: 03/24/2022 18:54   DG Chest 2 View  Result Date: 03/22/2022 CLINICAL DATA:  Encounter for hypoxia. EXAM: CHEST - 2 VIEW COMPARISON:  Portable chest 01/03/2021. FINDINGS: The cardiac size is upper limit of normal. There is a stable mediastinal configuration. Central vessels are normal caliber. Faint patchy hazy increased opacities noted in the right-greater-than-left upper lobes concerning for pneumonitis, with increased central bronchial thickening possibly indicating bronchopneumonia. The mid and lower lung fields are generally clear. No pleural  effusion is seen. Mild upper thoracic levoscoliosis is seen with interval wedging deformity and kyphoplasty cement at T5 and T6. IMPRESSION: 1. Increased findings of central bronchitis with increased opacity in both upper lobes concerning for multilobar pneumonia. 2. If pneumonia is clinically suspected, a follow-up study is recommended after treatment to ensure clearing. If other airspace disease etiology is considered, CT or CTA chest is recommended. 3. New finding of wedge compression fracture deformities with kyphoplasty at T5 and 6. Electronically Signed   By: Telford Nab M.D.   On: 03/22/2022 01:15   CT HEAD WO CONTRAST  Result Date: 03/22/2022 CLINICAL DATA:  Delirium. EXAM: CT HEAD WITHOUT CONTRAST TECHNIQUE: Contiguous axial images were obtained from the base of the skull through the vertex without intravenous contrast. RADIATION DOSE REDUCTION: This exam was performed according to the departmental dose-optimization program which includes automated exposure control, adjustment of the mA and/or kV according to patient size and/or use of iterative reconstruction technique. COMPARISON:  Head CT 11/02/2020. FINDINGS: Brain: No evidence of acute infarction, hemorrhage, hydrocephalus, extra-axial collection or  mass lesion/mass effect. Slight cerebral cortical atrophy is again noted. No findings of significant small-vessel disease. Vascular: There are scattered calcifications of the carotid siphons. There are no hyperdense central vessels. Skull: The calvarium skull base and orbits are intact. Sinuses/Orbits: No focal abnormality of the orbital contents. There is increased circumferential membrane disease and fluid in the right maxillary sinus. There is increased patchy opacification of the bilateral ethmoid air cells. There is mild increased membrane thickening without fluid in the left maxillary and bilateral sphenoid sinus. Frontal sinus and visualized mastoid air cells are clear. Other: None.  IMPRESSION: 1. Increased bilateral ethmoid sinus opacification, and increased fluid and membrane disease in the right maxillary sinus, correlate clinically for acute on chronic sinusitis. 2. No acute intracranial CT findings or intracranial interval changes. Electronically Signed   By: Telford Nab M.D.   On: 03/22/2022 01:10    Microbiology: Results for orders placed or performed during the hospital encounter of 03/21/22  Blood culture (routine x 2)     Status: None (Preliminary result)   Collection Time: 03/21/22 11:45 PM   Specimen: BLOOD  Result Value Ref Range Status   Specimen Description BLOOD SITE NOT SPECIFIED  Final   Special Requests   Final    BOTTLES DRAWN AEROBIC AND ANAEROBIC Blood Culture adequate volume   Culture   Final    NO GROWTH 3 DAYS Performed at [facility], [address], [place], [place] [address]    Report Status PENDING  Incomplete  Blood culture (routine x 2)     Status: None (Preliminary result)   Collection Time: 03/21/22 11:59 PM   Specimen: BLOOD  Result Value Ref Range Status   Specimen Description BLOOD LEFT ANTECUBITAL  Final   Special Requests   Final    BOTTLES DRAWN AEROBIC AND ANAEROBIC Blood Culture adequate volume   Culture   Final    NO GROWTH 3 DAYS Performed at [facility], [place] [address] [place], [place] [address]    Report Status PENDING  Incomplete  Expectorated Sputum Assessment w Gram Stain, Rflx to Resp Cult     Status: None (Preliminary result)   Collection Time: 03/22/22  6:00 AM   Specimen: Expectorated Sputum  Result Value Ref Range Status   Specimen Description EXPECTORATED SPUTUM  Final   Special Requests NONE  Final   Sputum evaluation   Final    THIS SPECIMEN IS ACCEPTABLE FOR SPUTUM CULTURE Performed at [facility], [place] [address], [place], [place] [address]    Report Status PENDING  Incomplete  Culture, Respiratory w Gram Stain     Status: None (Preliminary result)   Collection Time:  03/22/22  6:00 AM  Result Value Ref Range Status   Specimen Description EXPECTORATED SPUTUM  Final   Special Requests NONE Reflexed from [id number]  Final   Gram Stain   Final    FEW SQUAMOUS EPITHELIAL CELLS PRESENT FEW WBC SEEN RARE GRAM POSITIVE COCCI RARE YEAST FEW GRAM POSITIVE RODS    Culture   Final    CULTURE REINCUBATED FOR BETTER GROWTH Performed at [facility], [place] [address] [place], [place] [address]    Report Status PENDING  Incomplete    Labs: CBC: Recent Labs  Lab 03/21/22 2351 03/21/22 2358 03/23/22 0503 03/24/22 0311 03/25/22 0307  WBC 15.1*  --  11.7* 16.2* 29.5*  NEUTROABS 11.5*  --   --   --   --   HGB 11.8*  11.6* 11.3* 11.3* 10.8*  HCT 35.1* 34.0* 32.4* 33.0* 31.2*  MCV 93.4  --  90.5 88.7 89.4  PLT 228  --  274 336 222   Basic Metabolic Panel: Recent Labs  Lab 03/21/22 2351 03/21/22 2358 03/22/22 0638 03/23/22 0503 03/24/22 0311 03/25/22 0307  NA 134* 134* 136 139 140 142  K 4.1 3.3* 3.5 3.5 3.6 4.0  CL 99  --  103 105 104 109  CO2 25  --  _0 GLUCOSE 107*  --  110* 92 168* 133*  BUN 9  --  _1 CREATININE 1.58*  --  1.26* 0.79 0.70 0.72  CALCIUM 8.8*  --  8.4* 8.6* 8.9 8.6*  MG  --   --  1.6* 1.9  --   --   PHOS  --   --   --   --   --  3.9   Liver Function Tests: Recent Labs  Lab 03/21/22 2351 03/22/22 0638 03/25/22 0307  AST 37 22  --   ALT 15 15  --   ALKPHOS 83 67  --   BILITOT 1.7* 0.7  --   PROT 6.4* 5.7*  --   ALBUMIN 3.2* 2.6* 2.3*   CBG: Recent Labs  Lab 03/21/22 2340  GLUCAP 111*    Discharge time spent: greater than 30 minutes.  Signed: Bonnell Public, MD [facility] 03/25/2022

## 2022-03-26 LAB — CULTURE, RESPIRATORY W GRAM STAIN

## 2022-03-27 LAB — CULTURE, BLOOD (ROUTINE X 2)
Culture: NO GROWTH
Culture: NO GROWTH
Special Requests: ADEQUATE
Special Requests: ADEQUATE

## 2022-03-30 ENCOUNTER — Encounter: Payer: Self-pay | Admitting: Nurse Practitioner

## 2022-03-30 ENCOUNTER — Ambulatory Visit: Payer: BLUE CROSS/BLUE SHIELD | Admitting: Nurse Practitioner

## 2022-03-30 VITALS — BP 124/76 | HR 109 | Temp 98.7°F | Resp 16 | Ht 68.0 in | Wt 210.4 lb

## 2022-03-30 DIAGNOSIS — R7989 Other specified abnormal findings of blood chemistry: Secondary | ICD-10-CM

## 2022-03-30 DIAGNOSIS — F1410 Cocaine abuse, uncomplicated: Secondary | ICD-10-CM

## 2022-03-30 DIAGNOSIS — F3170 Bipolar disorder, currently in remission, most recent episode unspecified: Secondary | ICD-10-CM

## 2022-03-30 DIAGNOSIS — Z09 Encounter for follow-up examination after completed treatment for conditions other than malignant neoplasm: Secondary | ICD-10-CM

## 2022-03-30 DIAGNOSIS — F17200 Nicotine dependence, unspecified, uncomplicated: Secondary | ICD-10-CM

## 2022-03-30 DIAGNOSIS — A419 Sepsis, unspecified organism: Secondary | ICD-10-CM

## 2022-03-30 DIAGNOSIS — J189 Pneumonia, unspecified organism: Secondary | ICD-10-CM

## 2022-03-30 DIAGNOSIS — E785 Hyperlipidemia, unspecified: Secondary | ICD-10-CM

## 2022-03-30 DIAGNOSIS — R6520 Severe sepsis without septic shock: Secondary | ICD-10-CM

## 2022-03-30 DIAGNOSIS — G4701 Insomnia due to medical condition: Secondary | ICD-10-CM

## 2022-03-30 DIAGNOSIS — I10 Essential (primary) hypertension: Secondary | ICD-10-CM

## 2022-03-30 DIAGNOSIS — G894 Chronic pain syndrome: Secondary | ICD-10-CM

## 2022-03-30 DIAGNOSIS — R350 Frequency of micturition: Secondary | ICD-10-CM | POA: Diagnosis not present

## 2022-03-30 DIAGNOSIS — E349 Endocrine disorder, unspecified: Secondary | ICD-10-CM

## 2022-03-30 DIAGNOSIS — Z720 Tobacco use: Secondary | ICD-10-CM

## 2022-03-30 LAB — POCT URINALYSIS DIPSTICK
Bilirubin, UA: NEGATIVE
Blood, UA: NEGATIVE
Glucose, UA: NEGATIVE
Ketones, UA: NEGATIVE
Leukocytes, UA: NEGATIVE
Nitrite, UA: NEGATIVE
Protein, UA: NEGATIVE
Spec Grav, UA: 1.01 (ref 1.010–1.025)
Urobilinogen, UA: 0.2 E.U./dL
pH, UA: 6 (ref 5.0–8.0)

## 2022-03-30 NOTE — Progress Notes (Signed)
New Patient Office Visit  Subjective    Patient ID: [name], male    DOB: Jul 15, 1966  Age: 56 y.o. MRN: [id number]  CC:  Chief Complaint  Patient presents with   Establish Care    HPI [name] presents to establish care   Multiple pna: went to hospital on 03/21/2022 and admitted for pna.   HLD: Patient currently on simvastatin 40 mg.  States he lost a lot of weight when he lost his father and has lost a lot of weight in the past when he stopped his antipsychotic medications.  HTN: Patient currently maintained on lisinopril 20 mg daily.  States he does not check his blood pressure at home but no current symptoms.  Bipolar Dr. [facility] once every 3 months. Manages the alprazolam, seroquel, sertaline, lamotrigine, and Ambien  [facility]: chronic pain medication and gabapentin. 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 Was on testosterone injections states he is overdue for 1 and is to follow-up with them.  Outpatient Encounter Medications as of 03/30/2022  Medication Sig   albuterol (PROAIR HFA) 108 (90 Base) MCG/ACT inhaler Inhale 1-2 puffs into the lungs every 6 (six) hours as needed for wheezing or shortness of breath.   ALPRAZolam (XANAX) 1 MG tablet Take 1 mg by mouth 3 (three) times daily.   B-D 3CC LUER-LOK SYR 22GX1-1/2 22G X 1-1/2" 3 ML MISC    budesonide-formoterol (SYMBICORT) 80-4.5 MCG/ACT inhaler Inhale 2 puffs into the lungs 2 (two) times daily.   fluticasone (FLONASE) 50 MCG/ACT nasal spray Place 2 sprays into both nostrils daily.   gabapentin (NEURONTIN) 100 MG capsule Take 100 mg by mouth See admin instructions. Take 100 mg 3 times daily, may take an additional 100 mg dose as bedtime as needed for pain   lamoTRIgine (LAMICTAL) 150 MG tablet Take 150 mg by mouth at bedtime.   lisinopril (ZESTRIL) 20 MG tablet Take 1 tablet (20  mg total) by mouth daily.   methocarbamol (ROBAXIN) 500 MG tablet Take 1 tablet (500 mg total) by mouth 3 (three) times daily as needed for muscle spasms.   montelukast (SINGULAIR) 10 MG tablet Take 1 tablet (10 mg total) by mouth at bedtime.   naloxone (NARCAN) 4 MG/0.1ML LIQD nasal spray kit Place 4 mg into the nose as needed (opioid overdose). As directed   nicotine (NICODERM CQ - DOSED IN MG/24 HOURS) 21 mg/24hr patch Place 1 patch (21 mg total) onto the skin daily.   oxyCODONE-acetaminophen (PERCOCET) 10-325 MG tablet Take 1 tablet by mouth every 4 (four) hours as needed for pain.   pantoprazole (PROTONIX) 20 MG tablet Take 1 tablet (20 mg total) by mouth daily.   predniSONE (DELTASONE) 10 MG tablet Prednisone 60 mg po once daily for 3 days, then 40 mg po daily for 3 days, then 30 mg po daily for 3 days, then 20 mg po once daily for 3 days and then 10 mg po once daily for 3 days and stop.   QUEtiapine (SEROQUEL) 300 MG tablet Take 450 mg by mouth at bedtime. Taking 1 & 1/2 tabs = 450 mg   sertraline (ZOLOFT) 100 MG tablet Take 100 mg by mouth daily.   sildenafil (REVATIO) 20 MG tablet Take 2-5 tablets (40-100 mg total) by mouth daily as needed (for erectile dysfunction.).   simvastatin (ZOCOR) 40 MG tablet TAKE 1 TABLET BY MOUTH DAILY AT  6 PM.   tamsulosin (FLOMAX) 0.4 MG CAPS capsule Take 0.4 mg by mouth at bedtime.   testosterone cypionate (DEPOTESTOTERONE CYPIONATE) 100 MG/ML injection Inject 100 mg into the muscle once a week.   zolpidem (AMBIEN) 10 MG tablet Take 10 mg by mouth at bedtime.    No facility-administered encounter medications on file as of 03/30/2022.    Past Medical History:  Diagnosis Date   Anxiety    Arthritis    degeneration of spine & scoliosis   Bipolar disorder ([place])    Chest pain    GERD (gastroesophageal reflux disease)    H/O echocardiogram ?2015   Hyperlipidemia    Hypertension    Pneumonia 2002   [facility] admission    Seizures ([place])    Shortness  of breath dyspnea    Sleep apnea    test 3/15-waiting results-was told oxygen did drop- did additional study done at home, told that he doesn't have sleep apnea     Past Surgical History:  Procedure Laterality Date   ANTERIOR CERVICAL DECOMP/DISCECTOMY FUSION N/A 08/17/2015   Procedure: ANTERIOR CERVICAL DECOMPRESSION/DISCECTOMY FUSION Cervical five-cervical seven. Two LEVELS;  Surgeon: Melina Schools, MD;  Location: [facility];  Service: Orthopedics;  Laterality: N/A;   BIOPSY  07/15/2018   Procedure: BIOPSY;  Surgeon: Gatha Mayer, MD;  Location: [facility];  Service: Endoscopy;;   BRONCHOSCOPY     COLONOSCOPY WITH PROPOFOL N/A 07/15/2018   Procedure: COLONOSCOPY WITH PROPOFOL;  Surgeon: Gatha Mayer, MD;  Location: [facility];  Service: Endoscopy;  Laterality: N/A;   IR KYPHO EA ADDL LEVEL THORACIC OR LUMBAR  01/13/2021   IR KYPHO THORACIC WITH BONE BIOPSY  01/13/2021   MASS EXCISION Left 12/17/2013   Procedure: EXCISION MASS LEFT WRIST;  Surgeon: Tennis Must, MD;  Location: [facility];  Service: Orthopedics;  Laterality: Left;   SEPTOPLASTY  1990    Family History  Problem Relation Age of Onset   Cancer Mother        melanoma   Clotting disorder Father    Depression Other    Cancer Other        skin   Colon polyps Neg Hx    Esophageal cancer Neg Hx    Rectal cancer Neg Hx    Stomach cancer Neg Hx     Social History   Socioeconomic History   Marital status: Married    Spouse name: Not on file   Number of children: Not on file   Years of education: Not on file   Highest education level: Not on file  Occupational History    Employer: SELF EMPLOYED  Tobacco Use   Smoking status: Every Day    Packs/day: 1.00    Years: 40.00    Total pack years: 40.00    Types: Cigarettes   Smokeless tobacco: Former  Scientific laboratory technician Use: Former  Substance and Sexual Activity   Alcohol use: No    Alcohol/week: 0.0 standard drinks of alcohol    Comment: quit  2007   Drug use: No    Comment: history cocaine-last 2006   Sexual activity: Yes  Other Topics Concern   Not on file  Social History Narrative   Married, self-employed   Vapes   No EtOH since 2007, no drug use   Social Determinants of Radio broadcast assistant Strain: Not on file  Food Insecurity: Not on file  Transportation Needs: Not on file  Physical Activity:  Not on file  Stress: Not on file  Social Connections: Not on file  Intimate Partner Violence: Not on file    Review of Systems  Constitutional:  Negative for chills and fever.  Respiratory:  Positive for cough and shortness of breath.   Gastrointestinal:  Positive for diarrhea.  Genitourinary:  Positive for frequency. Negative for dysuria and hematuria.  Psychiatric/Behavioral:  Negative for suicidal ideas.         Objective    BP 124/76   Pulse (!) 109   Temp 98.7 F (37.1 C)   Resp 16   Ht 5' 8"  (1.727 m)   Wt 210 lb 6 oz (95.4 kg)   SpO2 96%   BMI 31.99 kg/m   Physical Exam Vitals and nursing note reviewed.  Constitutional:      Appearance: Normal appearance. He is obese.  Cardiovascular:     Rate and Rhythm: Normal rate and regular rhythm.     Heart sounds: Normal heart sounds.  Pulmonary:     Effort: Pulmonary effort is normal.     Breath sounds: Normal breath sounds.  Abdominal:     General: Bowel sounds are normal. There is no distension.     Palpations: There is no mass.     Tenderness: There is no abdominal tenderness.     Hernia: No hernia is present.  Musculoskeletal:     Right lower leg: No edema.     Left lower leg: No edema.  Neurological:     Mental Status: He is alert.         Assessment & Plan:   Problem List Items Addressed This Visit       Cardiovascular and Mediastinum   Essential hypertension    Patient currently managed with lisinopril 20 mg.  Continue taking medication as prescribed        Respiratory   Multifocal pneumonia    Recent hospitalization  with discharge.  Patient has completed oral antibiotics and is currently on prednisone.  He is having difficulty sleeping with prednisone he recently stepped down on the dosing did encourage patient to take doses earlier in the day this will hopefully help with his insomnia.  I have him come back in approximate 5 weeks for recheck and repeat chest x-ray at that juncture      Relevant Orders   Ambulatory referral to Pulmonology     Other   HLD (hyperlipidemia)    Currently maintained on simvastatin 40 mg.  Likely multifactorial and secondary to antipsychotic medication use.      Bipolar disorder ([place])   TOBACCO ABUSE   Relevant Orders   Ambulatory referral to Pulmonology   Testosterone deficiency   Low testosterone    States this has been managed by [facility].  He will continue following with them and taking testosterone at their direction       Insomnia due to medical condition   Current nicotine use    Quitting smoking given his recent bout with pneumonia and hospitalization.  Currently using nicotine patches continue work on smoking cessation      Cocaine abuse ([place])    Currently in remission.  Patient states last use was approximately 1 year ago      Chronic pain    Patient currently managed and maintained through pain management/[facility].  Continue taking medications as prescribed and follow-up as recommended      Severe sepsis [facility])    Was recently mated to the hospital for multifocal pneumonia that  led to sepsis.  Patient has been discharged to review little discharge note and most recent labs.  His white blood cell count was elevated but on prednisone and currently on prednisone defer to check today patient has improved slightly      Hospital discharge follow-up - Primary    Patient presents for hospital follow-up and establish care.  Did review hospital discharge note and most recent labs.      Urinary frequency    Patient did complain of urinary frequency  since being in the hospital.  UA negative in office.  Continue to monitor      Relevant Orders   POCT urinalysis dipstick (Completed)    Return in about 5 weeks (around 05/04/2022) for PNA recheck with xray and labs.   Romilda Garret, NP

## 2022-03-30 NOTE — Assessment & Plan Note (Signed)
Patient currently managed and maintained through pain management/[facility].  Continue taking medications as prescribed and follow-up as recommended

## 2022-03-30 NOTE — Assessment & Plan Note (Signed)
Patient presents for hospital follow-up and establish care.  Did review hospital discharge note and most recent labs.

## 2022-03-30 NOTE — Assessment & Plan Note (Signed)
Patient currently managed with lisinopril 20 mg.  Continue taking medication as prescribed

## 2022-03-30 NOTE — Assessment & Plan Note (Signed)
Was recently mated to the hospital for multifocal pneumonia that led to sepsis.  Patient has been discharged to review little discharge note and most recent labs.  His white blood cell count was elevated but on prednisone and currently on prednisone defer to check today patient has improved slightly

## 2022-03-30 NOTE — Assessment & Plan Note (Signed)
Quitting smoking given his recent bout with pneumonia and hospitalization.  Currently using nicotine patches continue work on smoking cessation

## 2022-03-30 NOTE — Assessment & Plan Note (Signed)
Currently maintained on simvastatin 40 mg.  Likely multifactorial and secondary to antipsychotic medication use.

## 2022-03-30 NOTE — Assessment & Plan Note (Signed)
States this has been managed by [facility].  He will continue following with them and taking testosterone at their direction

## 2022-03-30 NOTE — Assessment & Plan Note (Signed)
Recent hospitalization with discharge.  Patient has completed oral antibiotics and is currently on prednisone.  He is having difficulty sleeping with prednisone he recently stepped down on the dosing did encourage patient to take doses earlier in the day this will hopefully help with his insomnia.  I have him come back in approximate 5 weeks for recheck and repeat chest x-ray at that juncture

## 2022-03-30 NOTE — Assessment & Plan Note (Signed)
Patient did complain of urinary frequency since being in the hospital.  UA negative in office.  Continue to monitor

## 2022-03-30 NOTE — Assessment & Plan Note (Signed)
Currently in remission.  Patient states last use was approximately 1 year ago

## 2022-03-30 NOTE — Patient Instructions (Signed)
Nice to see you today I want to see you in 5 weeks, sooner if you need me The lung doctor will reach out to you I 10-14 days. Rinse your mouth out after you use the Symbicort inhaler each time Let me know when you need medications refilled that Dr. Rex Kras use to fill  Finish the prednisone as prescribed. Also walk around the house to get up and get to moving.

## 2022-04-16 ENCOUNTER — Other Ambulatory Visit: Payer: Self-pay | Admitting: Nurse Practitioner

## 2022-04-16 DIAGNOSIS — E785 Hyperlipidemia, unspecified: Secondary | ICD-10-CM

## 2022-05-07 ENCOUNTER — Encounter: Payer: Self-pay | Admitting: Nurse Practitioner

## 2022-05-07 ENCOUNTER — Ambulatory Visit (INDEPENDENT_AMBULATORY_CARE_PROVIDER_SITE_OTHER)
Admission: RE | Admit: 2022-05-07 | Discharge: 2022-05-07 | Disposition: A | Discharge status: Home / Self Care | Payer: BLUE CROSS/BLUE SHIELD | Source: Ambulatory Visit | Attending: Nurse Practitioner | Admitting: Nurse Practitioner

## 2022-05-07 ENCOUNTER — Ambulatory Visit (INDEPENDENT_AMBULATORY_CARE_PROVIDER_SITE_OTHER): Payer: BLUE CROSS/BLUE SHIELD | Admitting: Nurse Practitioner

## 2022-05-07 VITALS — BP 128/78 | HR 115 | Temp 97.5°F | Resp 14 | Ht 68.0 in | Wt 215.4 lb

## 2022-05-07 DIAGNOSIS — I10 Essential (primary) hypertension: Secondary | ICD-10-CM

## 2022-05-07 DIAGNOSIS — E66811 Obesity, class 1: Secondary | ICD-10-CM

## 2022-05-07 DIAGNOSIS — J189 Pneumonia, unspecified organism: Secondary | ICD-10-CM | POA: Diagnosis not present

## 2022-05-07 DIAGNOSIS — F17200 Nicotine dependence, unspecified, uncomplicated: Secondary | ICD-10-CM

## 2022-05-07 DIAGNOSIS — R7989 Other specified abnormal findings of blood chemistry: Secondary | ICD-10-CM

## 2022-05-07 DIAGNOSIS — Z79899 Other long term (current) drug therapy: Secondary | ICD-10-CM

## 2022-05-07 DIAGNOSIS — Z6832 Body mass index (BMI) 32.0-32.9, adult: Secondary | ICD-10-CM

## 2022-05-07 DIAGNOSIS — E6609 Other obesity due to excess calories: Secondary | ICD-10-CM

## 2022-05-07 DIAGNOSIS — J188 Other pneumonia, unspecified organism: Secondary | ICD-10-CM

## 2022-05-07 LAB — TSH: TSH: 1.49 u[IU]/mL (ref 0.35–5.50)

## 2022-05-07 LAB — CBC
HCT: 45.3 % (ref 39.0–52.0)
Hemoglobin: 15 g/dL (ref 13.0–17.0)
MCHC: 33.2 g/dL (ref 30.0–36.0)
MCV: 93.6 fl (ref 78.0–100.0)
Platelets: 281 10*3/uL (ref 150.0–400.0)
RBC: 4.84 Mil/uL (ref 4.22–5.81)
RDW: 14.6 % (ref 11.5–15.5)
WBC: 10.4 10*3/uL (ref 4.0–10.5)

## 2022-05-07 LAB — COMPREHENSIVE METABOLIC PANEL
ALT: 30 U/L (ref 0–53)
AST: 25 U/L (ref 0–37)
Albumin: 4.8 g/dL (ref 3.5–5.2)
Alkaline Phosphatase: 76 U/L (ref 39–117)
BUN: 9 mg/dL (ref 6–23)
CO2: 25 mEq/L (ref 19–32)
Calcium: 10.1 mg/dL (ref 8.4–10.5)
Chloride: 103 mEq/L (ref 96–112)
Creatinine, Ser: 0.93 mg/dL (ref 0.40–1.50)
GFR: 93.24 mL/min (ref >60.00)
Glucose, Bld: 77 mg/dL (ref 70–99)
Potassium: 4.7 mEq/L (ref 3.5–5.1)
Sodium: 137 mEq/L (ref 135–145)
Total Bilirubin: 0.3 mg/dL (ref 0.2–1.2)
Total Protein: 7.2 g/dL (ref 6.0–8.3)

## 2022-05-07 LAB — LIPID PANEL
Cholesterol: 181 mg/dL (ref 0–200)
HDL: 57.9 mg/dL (ref >39.00)
LDL Cholesterol: 98 mg/dL (ref 0–99)
NonHDL: 123.09
Total CHOL/HDL Ratio: 3
Triglycerides: 125 mg/dL (ref 0.0–149.0)
VLDL: 25 mg/dL (ref 0.0–40.0)

## 2022-05-07 LAB — HEMOGLOBIN A1C: Hgb A1c MFr Bld: 5.8 % (ref 4.6–6.5)

## 2022-05-07 NOTE — Progress Notes (Signed)
Established Patient Office Visit  Subjective   Patient ID: [name], male    DOB: 11-21-1965  Age: 56 y.o. MRN: [id number]  Chief Complaint  Patient presents with   Follow-up    On pneumonia-feels better, he did loose Symbicort inhaler about a week ago.    HPI  COPD: Symbicort he has noticed a big difference. States that he would use the albuterol once a day. Prior was using it several times a day.  He has been referred to [facility] pulmonology.  PNA: States that he feels better.  Patient was hospitalized on 03/21/2022 for multifocal pneumonia.  Was placed on oral antibiotics and steroids patient is completed both states he feels much better here for recheck with x-ray  Tobacco use: Back to smoking with approx 1 pack a day. Had some patches tell him to stop smoking and was sick so he could not smoke at the time. But since then he has pick smoking up again.     Review of Systems  Constitutional:  Negative for chills and fever.  Respiratory:  Positive for shortness of breath (increased but improved since hospital). Negative for cough.   Cardiovascular:  Negative for chest pain.      Objective:     BP 128/78   Pulse (!) 115   Temp (!) 97.5 F (36.4 C)   Resp 14   Ht '5\' 8"'$  (1.727 m)   Wt 215 lb 6 oz (97.7 kg)   SpO2 96%   BMI 32.75 kg/m    Physical Exam Vitals and nursing note reviewed.  Constitutional:      Appearance: Normal appearance. He is obese.  Cardiovascular:     Rate and Rhythm: Normal rate and regular rhythm.     Heart sounds: Normal heart sounds.  Pulmonary:     Effort: Pulmonary effort is normal.     Breath sounds: Normal breath sounds.  Musculoskeletal:     Right lower leg: No edema.     Left lower leg: No edema.  Neurological:     Mental Status: He is alert.      Results for orders placed or performed in visit on 05/07/22  CBC  Result Value Ref Range   WBC 10.4 4.0 - 10.5 K/uL   RBC 4.84 4.22 - 5.81 Mil/uL   Platelets 281.0 150.0 -  400.0 K/uL   Hemoglobin 15.0 13.0 - 17.0 g/dL   HCT 45.3 39.0 - 52.0 %   MCV 93.6 78.0 - 100.0 fl   MCHC 33.2 30.0 - 36.0 g/dL   RDW 14.6 11.5 - 15.5 %  Comprehensive metabolic panel  Result Value Ref Range   Sodium 137 135 - 145 mEq/L   Potassium 4.7 3.5 - 5.1 mEq/L   Chloride 103 96 - 112 mEq/L   CO2 25 19 - 32 mEq/L   Glucose, Bld 77 70 - 99 mg/dL   BUN 9 6 - 23 mg/dL   Creatinine, Ser 0.93 0.40 - 1.50 mg/dL   Total Bilirubin 0.3 0.2 - 1.2 mg/dL   Alkaline Phosphatase 76 39 - 117 U/L   AST 25 0 - 37 U/L   ALT 30 0 - 53 U/L   Total Protein 7.2 6.0 - 8.3 g/dL   Albumin 4.8 3.5 - 5.2 g/dL   GFR 93.24 >60.00 mL/min   Calcium 10.1 8.4 - 10.5 mg/dL  Hemoglobin A1c  Result Value Ref Range   Hgb A1c MFr Bld 5.8 4.6 - 6.5 %  Lipid panel  Result Value Ref Range   Cholesterol 181 0 - 200 mg/dL   Triglycerides 125.0 0.0 - 149.0 mg/dL   HDL 57.90 >39.00 mg/dL   VLDL 25.0 0.0 - 40.0 mg/dL   LDL Cholesterol 98 0 - 99 mg/dL   Total CHOL/HDL Ratio 3    NonHDL 123.09   TSH  Result Value Ref Range   TSH 1.49 0.35 - 5.50 uIU/mL      The 10-year ASCVD risk score (Arnett DK, et al., 2019) is: 8.9%    Assessment & Plan:   Problem List Items Addressed This Visit       Cardiovascular and Mediastinum   Essential hypertension    Blood pressure within normal limits in office today.  Continue taking medication as prescribed      Relevant Orders   CBC (Completed)   Comprehensive metabolic panel (Completed)   TSH (Completed)     Respiratory   Multifocal pneumonia - Primary    Patient here for follow-up of multifocal pneumonia.  Pending chest x-ray today.  Patient completed prednisone and antibiotics as prescribed has been referred to pulmonology for possible COPD has done better with Symbicort inhaler      Relevant Orders   DG Chest 2 View (Completed)     Other   TOBACCO ABUSE    States that he tried the patches without good result. Did offer Wellbutrin but is concerned that  it will interact with his other BH meds. He will check with his [facility] provider      Abnormal CBC   Relevant Orders   CBC (Completed)   Class 1 obesity due to excess calories with serious comorbidity and body mass index (BMI) of 32.0 to 32.9 in adult   Relevant Orders   Hemoglobin A1c (Completed)   Lipid panel (Completed)   High risk medication use    Patient on behavioral health medications that have been known to have metabolic side effects.  Pending labs today      Relevant Orders   Hemoglobin A1c (Completed)   Lipid panel (Completed)    Return in about 6 months (around 11/07/2022) for CPE and labs.    Romilda Garret, NP

## 2022-05-07 NOTE — Assessment & Plan Note (Signed)
States that he tried the patches without good result. Did offer Wellbutrin but is concerned that it will interact with his other BH meds. He will check with his [facility] provider

## 2022-05-07 NOTE — Assessment & Plan Note (Signed)
Blood pressure within normal limits in office today.  Continue taking medication as prescribed

## 2022-05-07 NOTE — Assessment & Plan Note (Signed)
Patient here for follow-up of multifocal pneumonia.  Pending chest x-ray today.  Patient completed prednisone and antibiotics as prescribed has been referred to pulmonology for possible COPD has done better with Symbicort inhaler

## 2022-05-07 NOTE — Assessment & Plan Note (Signed)
Patient on behavioral health medications that have been known to have metabolic side effects.  Pending labs today

## 2022-05-07 NOTE — Patient Instructions (Addendum)
Nice to see you today I will be in touch with the labs once I have them Follow up in 6 months for a physical with me. Sooner if you need it  Check with [facility] about going on Wellbutrin  Lung Doctor information   Address: [address], [place], [place] [address] Hours:  Open ? Closes 5?PM Phone: [phone]

## 2022-05-10 ENCOUNTER — Ambulatory Visit
Admission: RE | Admit: 2022-05-10 | Discharge: 2022-05-10 | Disposition: A | Discharge status: Home / Self Care | Payer: BLUE CROSS/BLUE SHIELD | Source: Ambulatory Visit | Attending: Nurse Practitioner | Admitting: Nurse Practitioner

## 2022-05-10 ENCOUNTER — Telehealth: Payer: Self-pay | Admitting: Nurse Practitioner

## 2022-05-10 ENCOUNTER — Telehealth: Payer: Self-pay

## 2022-05-10 DIAGNOSIS — J189 Pneumonia, unspecified organism: Secondary | ICD-10-CM

## 2022-05-10 NOTE — Telephone Encounter (Signed)
Stat order placed

## 2022-05-10 NOTE — Telephone Encounter (Signed)
Patient is returning a call about his imaging results.

## 2022-05-10 NOTE — Telephone Encounter (Signed)
Called patient back but could not leave a message. Patient did view his results on mychart per his chart after he called back

## 2022-05-10 NOTE — Telephone Encounter (Signed)
-----   Message from [place] sent at 05/09/2022  3:15 PM EDT ----- Patient advised. Please place order for CT scan in  and patient will cancel if he gets in with pulmonologist sooner than the scan can be done.

## 2022-05-14 ENCOUNTER — Other Ambulatory Visit: Payer: Self-pay | Admitting: Nurse Practitioner

## 2022-05-14 DIAGNOSIS — I10 Essential (primary) hypertension: Secondary | ICD-10-CM

## 2022-05-24 ENCOUNTER — Telehealth: Payer: Self-pay | Admitting: Nurse Practitioner

## 2022-05-24 DIAGNOSIS — J189 Pneumonia, unspecified organism: Secondary | ICD-10-CM

## 2022-05-24 DIAGNOSIS — F17200 Nicotine dependence, unspecified, uncomplicated: Secondary | ICD-10-CM

## 2022-05-24 NOTE — Telephone Encounter (Signed)
Pt called and stated he had got referred to a pulmonary dr and they stated the pt didn't schedule the appt in time enough so he needs to get re-referred. Callback # is [phone]

## 2022-05-24 NOTE — Telephone Encounter (Signed)
I pended order for referral with the same in formation as it was placed in July to review and sign

## 2022-05-25 NOTE — Telephone Encounter (Signed)
Referral signed.

## 2022-05-30 ENCOUNTER — Other Ambulatory Visit: Payer: Self-pay | Admitting: Family

## 2022-05-30 ENCOUNTER — Other Ambulatory Visit: Payer: Self-pay | Admitting: Nurse Practitioner

## 2022-05-30 ENCOUNTER — Telehealth: Payer: Self-pay | Admitting: Nurse Practitioner

## 2022-05-30 DIAGNOSIS — J4520 Mild intermittent asthma, uncomplicated: Secondary | ICD-10-CM

## 2022-05-30 NOTE — Telephone Encounter (Signed)
Patient does not have inhalers at all  Cannot get symbicort inhaler until the 06-11-22, and needs it asap  [phone]

## 2022-05-30 NOTE — Telephone Encounter (Signed)
Spoke with patient. Patient went out of town and left/lost this inhaler and needs this filled. Her is aware that insurance will not pay for this as it is too early to fill and he will have to pay out of pocket  and he will call the pharmacy to speak with them about the cost, please send to [facility] drug. Last RXs were sent to [facility]

## 2022-05-30 NOTE — Telephone Encounter (Signed)
Patient went out of town and left/lost this inhaler and needs this filled. Her is aware that insurance may not pay for it if its too early and he will call the pharmacy to speak with them about the cost, please send to [facility] drug

## 2022-05-30 NOTE — Telephone Encounter (Signed)
[name] is out of budesonide-formoterol (SYMBICORT) 80-4.5 MCG/ACT inhaler   [facility] said it could not be [facility] until 9.18.23  Patient says he can't wait  Also, patient wantss all his scripts to go to [facility], not [facility]  Please call the patient at [phone] to discuss his options

## 2022-05-31 ENCOUNTER — Other Ambulatory Visit: Payer: Self-pay | Admitting: Nurse Practitioner

## 2022-05-31 MED ORDER — BUDESONIDE-FORMOTEROL FUMARATE 80-4.5 MCG/ACT IN AERO: 2.0000 | INHALATION_SPRAY | Freq: Two times a day (BID) | RESPIRATORY_TRACT | 12 refills | Status: AC

## 2022-05-31 NOTE — Telephone Encounter (Signed)
Medications sent to pharmacy

## 2022-05-31 NOTE — Telephone Encounter (Signed)
See other phone note, disregard this request

## 2022-06-06 ENCOUNTER — Ambulatory Visit (INDEPENDENT_AMBULATORY_CARE_PROVIDER_SITE_OTHER)
Admission: RE | Admit: 2022-06-06 | Discharge: 2022-06-06 | Disposition: A | Discharge status: Home / Self Care | Payer: BLUE CROSS/BLUE SHIELD | Source: Ambulatory Visit | Attending: Family Medicine | Admitting: Family Medicine

## 2022-06-06 ENCOUNTER — Ambulatory Visit (INDEPENDENT_AMBULATORY_CARE_PROVIDER_SITE_OTHER): Payer: BLUE CROSS/BLUE SHIELD | Admitting: Family Medicine

## 2022-06-06 ENCOUNTER — Telehealth: Payer: Self-pay | Admitting: Nurse Practitioner

## 2022-06-06 ENCOUNTER — Encounter: Payer: Self-pay | Admitting: Family Medicine

## 2022-06-06 VITALS — BP 100/60 | HR 104 | Temp 97.5°F | Ht 68.0 in | Wt 225.1 lb

## 2022-06-06 DIAGNOSIS — R0689 Other abnormalities of breathing: Secondary | ICD-10-CM

## 2022-06-06 DIAGNOSIS — J189 Pneumonia, unspecified organism: Secondary | ICD-10-CM

## 2022-06-06 DIAGNOSIS — Z8701 Personal history of pneumonia (recurrent): Secondary | ICD-10-CM

## 2022-06-06 DIAGNOSIS — R0600 Dyspnea, unspecified: Secondary | ICD-10-CM

## 2022-06-06 DIAGNOSIS — F17200 Nicotine dependence, unspecified, uncomplicated: Secondary | ICD-10-CM | POA: Diagnosis not present

## 2022-06-06 MED ORDER — LEVOFLOXACIN 500 MG PO TABS: 500.0000 mg | ORAL_TABLET | Freq: Every day | ORAL | 0 refills | Status: DC

## 2022-06-06 NOTE — Telephone Encounter (Signed)
Patient wife called in and stated that patient is having difficulty breathing. She stated he had pneumonia recently and may still have some. Patient gets winded and has a hard time catching his breath. He is scheduled today with Dr. Lorelei Pont. Sent over to access nurse.

## 2022-06-06 NOTE — Telephone Encounter (Signed)
Will review with him in the office.

## 2022-06-06 NOTE — Progress Notes (Signed)
[name] Giovanni, MD, [place] at [facility] [place], [address]  Phone: [phone]  FAX: [phone]  [name] - 56 y.o. male  MRN [id number]  Date of Birth: [birth year]  Date: 06/06/2022  PCP: Michela Pitcher, NP  Referral: Michela Pitcher, NP  Chief Complaint  Patient presents with   Shortness of Breath        Subjective:   [name] is a 56 y.o. very pleasant male patient with Body mass index is 34.23 kg/m. who presents with the following:  Hosp sepsis and PNA -discharged March 25, 2022.  Having some chest pain - stqarted today.  Some pain in the shoulder. Has been having some shortness of breath since he left the hospital, but this feels different and he feels significantly worse.  He does have COPD that was identified during his hospitalization and scans, and he has been doing maintenance inhalers daily.  He also has some albuterol and he is using this about twice a day.  Doing some Symbicort 160 mg bid  Smokes about 1 1/2 packs per day, and he has resumed this  Heard him wheezing last night Some wheezing and rhonchi  LVQ - RML PNA  Review of Systems is noted in the HPI, as appropriate  Objective:   BP 100/60   Pulse (!) 104   Temp (!) 97.5 F (36.4 C) (Oral)   Ht 5' 8"  (1.727 m)   Wt 225 lb 2 oz (102.1 kg)   SpO2 97%   BMI 34.23 kg/m   GEN: No acute distress; alert,appropriate. PULM: Breathing comfortably in no respiratory distress PSYCH: Normally interactive.  CV: RRR, no m/g/r  PULM: Normal respiratory rate, no accessory muscle use.  Faint wheezes with rhonchorous sounds on the right lung  Laboratory and Imaging Data: DG Chest 2 View  Result Date: 06/07/2022 CLINICAL DATA:  Shortness of breath EXAM: CHEST - 2 VIEW COMPARISON:  Chest radiograph 05/07/2022 FINDINGS: Stable cardiac and mediastinal contours. Patchy consolidation right lower lung. No pleural effusion or  pneumothorax. Kyphoplasty material upper thoracic spine. IMPRESSION: Consolidation within the medial right lower lung concerning for pneumonia in the appropriate clinical setting. Followup PA and lateral chest X-ray is recommended in 3-4 weeks following trial of antibiotic therapy to ensure resolution and exclude underlying malignancy. Electronically Signed   By: Lovey Newcomer M.D.   On: 06/07/2022 16:46     Assessment and Plan:     ICD-10-CM   1. Pneumonia of right lower lobe due to infectious organism  J18.9     2. Dyspnea and respiratory abnormality  R06.00 DG Chest 2 View   R06.89     3. History of bacterial pneumonia  Z87.01 DG Chest 2 View    4. Smoker  F17.200      Unfortunately, it appears like he does have right middle lobe pneumonia, challenging in his recent hospitalization and sepsis.  Start a course of Levaquin. I will hold on any steroids in a patient with bipolar illness who had a bad reaction in slept very little for 2 weeks when he took it previously.  We will need to repeat chest x-ray in 3 to 4 weeks.  Medication Management during today's office visit: Meds ordered this encounter  Medications   levofloxacin (LEVAQUIN) 500 MG tablet    Sig: Take 1 tablet (500 mg total) by mouth daily.    Dispense:  7 tablet  Refill:  0   Medications Discontinued During This Encounter  Medication Reason   QUEtiapine (SEROQUEL) 300 MG tablet Change in therapy   lamoTRIgine (LAMICTAL) 150 MG tablet Change in therapy   nicotine (NICODERM CQ - DOSED IN MG/24 HOURS) 21 mg/24hr patch Patient Preference    Orders placed today for conditions managed today: Orders Placed This Encounter  Procedures   DG Chest 2 View    Disposition: No follow-ups on file.  Dragon [employer] One speech-to-text software was used for transcription in this dictation.  Possible transcriptional errors can occur using Editor, commissioning.   Signed,  Maud Deed. Capucine Tryon, MD   Outpatient Encounter Medications  as of 06/06/2022  Medication Sig   albuterol (VENTOLIN HFA) 108 (90 Base) MCG/ACT inhaler INHALE 1 TO 2 PUFFS INTO THE LUNGS EVERY 6 HOURS AS NEEDED FOR WHEEZING OR SHORTNESS OF BREATH.   ALPRAZolam (XANAX) 1 MG tablet Take 1 mg by mouth 4 (four) times daily.   B-D 3CC LUER-LOK SYR 22GX1-1/2 22G X 1-1/2" 3 ML MISC    budesonide-formoterol (SYMBICORT) 80-4.5 MCG/ACT inhaler Inhale 2 puffs into the lungs 2 (two) times daily.   fluticasone (FLONASE) 50 MCG/ACT nasal spray Place 2 sprays into both nostrils daily.   gabapentin (NEURONTIN) 100 MG capsule Take 100 mg by mouth See admin instructions. Take 100 mg 3 times daily, may take an additional 100 mg dose as bedtime as needed for pain   lamoTRIgine (LAMICTAL) 100 MG tablet Take 100 mg by mouth daily.   levofloxacin (LEVAQUIN) 500 MG tablet Take 1 tablet (500 mg total) by mouth daily.   lisinopril (ZESTRIL) 20 MG tablet TAKE 1 TABLET BY MOUTH DAILY. Marland KitchenDOCTOR RETIRED. NEEDS NEW DOCTOR.   methocarbamol (ROBAXIN) 500 MG tablet Take 1 tablet (500 mg total) by mouth 3 (three) times daily as needed for muscle spasms.   naloxone (NARCAN) 4 MG/0.1ML LIQD nasal spray kit Place 4 mg into the nose as needed (opioid overdose). As directed   oxyCODONE-acetaminophen (PERCOCET) 10-325 MG tablet Take 1 tablet by mouth every 4 (four) hours as needed for pain.   pantoprazole (PROTONIX) 20 MG tablet Take 1 tablet (20 mg total) by mouth daily.   QUEtiapine (SEROQUEL) 200 MG tablet Take 200 mg by mouth at bedtime.   sertraline (ZOLOFT) 100 MG tablet Take 100 mg by mouth daily.   sildenafil (REVATIO) 20 MG tablet Take 2-5 tablets (40-100 mg total) by mouth daily as needed (for erectile dysfunction.).   simvastatin (ZOCOR) 40 MG tablet TAKE 1 TABLET BY MOUTH DAILY IN THE EVENING..DOCTOR RETIRED. NEEDS NEW DOCTOR.   tamsulosin (FLOMAX) 0.4 MG CAPS capsule Take 0.4 mg by mouth at bedtime.   testosterone cypionate (DEPOTESTOTERONE CYPIONATE) 100 MG/ML injection Inject 100 mg  into the muscle once a week.   zolpidem (AMBIEN) 10 MG tablet Take 10 mg by mouth at bedtime.    [DISCONTINUED] nicotine (NICODERM CQ - DOSED IN MG/24 HOURS) 21 mg/24hr patch Place 1 patch (21 mg total) onto the skin daily.   montelukast (SINGULAIR) 10 MG tablet Take 1 tablet (10 mg total) by mouth at bedtime.   [DISCONTINUED] lamoTRIgine (LAMICTAL) 150 MG tablet Take 150 mg by mouth at bedtime.   [DISCONTINUED] QUEtiapine (SEROQUEL) 300 MG tablet Take 300 mg by mouth at bedtime.   No facility-administered encounter medications on file as of 06/06/2022.

## 2022-06-06 NOTE — Telephone Encounter (Signed)
I spoke with [name]; [name] said CP on and off with SOB upon exertion. [name] said his shoulder hurts but that is something else. [name] advised to go to ED; [name] said he does not have time to sit at ED and already has appt to see Dr Lorelei Pont today at 2:20. [name] said he thinks could be pneumonia again. ED precautions given and [name] voiced understanding and said if worsens will go to ED otherwise will see Dr Lorelei Pont this afternoon. Sending note to Dr Lorelei Pont and Butch Penny CMA and will teams Butch Penny.

## 2022-06-06 NOTE — Telephone Encounter (Signed)
[name] - Client TELEPHONE ADVICE RECORD Access[facility] Patient Name: [name] Gender: Male DOB: [birth year] Age: 56 Y 24 M 9 D Return Phone Number: [phone] (Primary), [phone] (Secondary) Address: City/ State/ Zip: [place]  [address] Client [facility] - Client Client Site [place] [facility] Provider Romilda Garret- NP Contact Type Call Who Is Calling Patient / Member / Family / Caregiver Call Type Triage / Clinical Caller Name [name] Relationship To Patient Spouse Return Phone Number [phone] (Secondary) Chief Complaint BREATHING - shortness of breath or sounds breathless Reason for Call Symptomatic / Request for [place] states her husband has been having difficulty breathing and has previously was diagnosed with pneumonia. Translation No Nurse Assessment Nurse: Rolin Barry, RN, Levada Dy Date/Time Eilene Ghazi Time): 06/06/2022 11:32:50 AM Confirm and document reason for call. If symptomatic, describe symptoms. ---Caller states her husband has been having difficulty breathing and has previously was diagnosed with pneumonia a couple months ago. Caller also advised that he had sepsis as well. No temp. Caller advised that he has had ongoing difficulty breathing. Does the patient have any new or worsening symptoms? ---Yes Will a triage be completed? ---Yes Related visit to physician within the last 2 weeks? ---Yes Does the PT have any chronic conditions? (i.e. diabetes, asthma, this includes High risk factors for pregnancy, etc.) ---Yes List chronic conditions. ---recent hospitalization , sepsis , pneumonia smoker COPD Is this a behavioral health or substance abuse call? ---No Guidelines Guideline Title Affirmed Question Affirmed Notes Nurse Date/Time (Eastern Time) Chest Pain Pain also in shoulder(s) or arm(s) or jaw (Exception: Pain is clearly  made worse by movement.) Deaton, RN, Levada Dy 06/06/2022 11:35:20 AM PLEASE NOTE: All timestamps contained within this report are represented as [place] Standard Time. CONFIDENTIALTY NOTICE: This fax transmission is intended only for the addressee. It contains information that is legally privileged, confidential or otherwise protected from use or disclosure. If you are not the intended recipient, you are strictly prohibited from reviewing, disclosing, copying using or disseminating any of this information or taking any action in reliance on or regarding this information. If you have received this fax in error, please notify us immediately by telephone so that we can arrange for its return to [place]. Phone: [phone], Toll-Free: [phone], Fax: [phone] Page: 2 of 2 Call Id: [id number] [place]. Time Eilene Ghazi Time) Disposition Final User 06/06/2022 11:31:03 AM Send to Urgent Zannie Kehr 06/06/2022 11:39:16 AM Go to ED Now Yes Rolin Barry, RN, Levada Dy Final Disposition 06/06/2022 11:39:16 AM Go to ED Now Yes Deaton, RN, Cindee Lame Disagree/Comply Disagree Caller Understands Yes PreDisposition Did not know what to do Care Advice Given Per Guideline GO TO ED NOW: * You need to be seen in the Emergency Department. * Go to the ED at ___________ [place] now. Drive carefully. NOTE TO TRIAGER - DRIVING: * Another adult should drive. * Patient should not delay going to the emergency department. CALL EMS IF: * Severe difficulty breathing occurs * Passes out or becomes too weak to stand * You become worse CARE ADVICE given per Chest Pain (Adult) guideline. Comments User: Saverio Danker, RN Date/Time Eilene Ghazi Time): 06/06/2022 11:46:19 AM Caller refused to go to the Ed, advised that he wants to be seen in the office, if the office tells him to go to the ED he will. Caller verbalized an understanding of the recommended. Called Asir Bingley at the backline and provided her with the  information, she will  call the patient. Referrals GO TO FACILITY REFUSE

## 2022-06-08 ENCOUNTER — Encounter: Payer: Self-pay | Admitting: Family Medicine

## 2022-06-20 ENCOUNTER — Encounter: Payer: Self-pay | Admitting: Pulmonary Disease

## 2022-06-20 ENCOUNTER — Ambulatory Visit (INDEPENDENT_AMBULATORY_CARE_PROVIDER_SITE_OTHER): Payer: BLUE CROSS/BLUE SHIELD | Admitting: Pulmonary Disease

## 2022-06-20 VITALS — BP 138/86 | HR 92 | Ht 68.0 in | Wt 226.0 lb

## 2022-06-20 DIAGNOSIS — R0609 Other forms of dyspnea: Secondary | ICD-10-CM

## 2022-06-20 MED ORDER — BREZTRI AEROSPHERE 160-9-4.8 MCG/ACT IN AERO: 2.0000 | INHALATION_SPRAY | Freq: Two times a day (BID) | RESPIRATORY_TRACT | 0 refills | Status: AC

## 2022-06-20 NOTE — Patient Instructions (Signed)
Nice to meet you  I am glad you are feeling better  I am not sure why you have had such frequent pneumonia recently  Your CT scan to me looks much improved and consistent with scarring or healing from your pneumonia in July  To help with your symptoms primarily of shortness of breath, try this new inhaler.  Use Breztri 2 puffs twice a day every day.  Do not use Symbicort while using Breztri.  Rinse your mouth out after every use of Breztri.  You or have your wife send me a message on MyChart in a couple weeks and let me know if you think it helps more than the Symbicort.  If so, I will send a new prescription for Breztri to your pharmacy.  If this is too expensive let me know and we will look for a solution.  Continue the other inhaler, albuterol as needed throughout the day.  I hope once you start the [facility] you will need this less frequently.  Return to clinic in 2 months with PFTs same day before follow-up visit with Dr. Silas Flood

## 2022-06-21 NOTE — Progress Notes (Signed)
@Patient  ID: [name], male    DOB: December 14, 1965, 56 y.o.   MRN: [id number]  Chief Complaint  Patient presents with   Consult    Referred by PCP for recurrent PNA    Referring provider: Michela Pitcher, NP  HPI:   56 y.o. whom we are seeing in consultation for evaluation of recurrent pneumonia and abnormal chest images.  Most recent PCP note x3 reviewed.    Patient was in usual state of health.  In July 2023 he developed acute onset of shortness of breath.  Cough.  Felt warm with chills.  Presented to the emergency room.  They are chest x-ray on my review and interpretation reveals scattered bilateral infiltrates concerning for multifocal pneumonia versus volume overload.  Subsequent CT chest demonstrated peribronchovascular groundglass opacity scattered throughout concerning for edema versus atypical infection.  COVID swab was negative.  He was treated with antibiotics.  He improved.  No diuresis was given.  Had a follow-up CT scan chest 04/2022 that on my review and interpretation revealed resolution of scattered groundglass opacities with linear opacities that are much less diffuse but in similar distribution to prior CT scan likely demonstrating resolution of pneumonia versus early postinflammatory fibrotic changes.  He was feeling fine for a few weeks then had similar symptoms middle of August 2023.  Less severe but onset of cough and shortness of breath.  Chest x-ray at that time on my review and interpretation reveals right midlung field infiltrate that is new or different than prior chest imaging.  He is given a course of Levaquin and symptoms have improved.  He has been on Symbicort in the past.  For intermittent wheeze and shortness of breath.  Sometimes has had steroids in the past.  Possibly related to COPD versus asthma.  He continues uses.  Using albuterol inhaler 3-4 times a day with mild improvement in symptoms.  He continues to smoke.  Down to a pack a day.  Was smoking 1-1/2  packs a day prior to hospitalization.  PMH: Hypertension, asthma, GERD, seasonal allergies Surgical history: Cervical decompression/discectomy thoracic kyphoplasty Family history: Mother with melanoma, father with history of blood clotting disorder Social history: Current smoker, down to a pack a day,  Licensed conveyancer / Pulmonary Flowsheets:   ACT:      No data to display          MMRC:     No data to display          Epworth:      No data to display          Tests:   FENO:  No results found for: "NITRICOXIDE"  PFT:     No data to display          WALK:      No data to display          Imaging: Personally reviewed and as per EMR and discussion in this note DG Chest 2 View  Result Date: 06/07/2022 CLINICAL DATA:  Shortness of breath EXAM: CHEST - 2 VIEW COMPARISON:  Chest radiograph 05/07/2022 FINDINGS: Stable cardiac and mediastinal contours. Patchy consolidation right lower lung. No pleural effusion or pneumothorax. Kyphoplasty material upper thoracic spine. IMPRESSION: Consolidation within the medial right lower lung concerning for pneumonia in the appropriate clinical setting. Followup PA and lateral chest X-ray is recommended in 3-4 weeks following trial of antibiotic therapy to ensure resolution and exclude underlying malignancy. Electronically Signed   By: Polly Cobia.D.  On: 06/07/2022 16:46    Lab Results:  CBC    Component Value Date/Time   WBC 10.4 05/07/2022 0943   RBC 4.84 05/07/2022 0943   HGB 15.0 05/07/2022 0943   HCT 45.3 05/07/2022 0943   PLT 281.0 05/07/2022 0943   MCV 93.6 05/07/2022 0943   MCH 30.9 03/25/2022 0307   MCHC 33.2 05/07/2022 0943   RDW 14.6 05/07/2022 0943   LYMPHSABS 1.8 03/21/2022 2351   MONOABS 1.4 (H) 03/21/2022 2351   EOSABS 0.2 03/21/2022 2351   BASOSABS 0.0 03/21/2022 2351    BMET    Component Value Date/Time   NA 137 05/07/2022 0943   K 4.7 05/07/2022 0943   CL 103 05/07/2022 0943   CO2 25  05/07/2022 0943   GLUCOSE 77 05/07/2022 0943   BUN 9 05/07/2022 0943   CREATININE 0.93 05/07/2022 0943   CALCIUM 10.1 05/07/2022 0943   GFRNONAA >60 03/25/2022 0307   GFRAA >60 08/09/2015 0908    BNP No results found for: "BNP"  ProBNP No results found for: "PROBNP"  Specialty Problems       Pulmonary Problems   Acute respiratory failure with hypoxia (HCC)   Multifocal pneumonia    No Known Allergies  Immunization History  Administered Date(s) Administered   Influenza,inj,Quad PF,6+ Mos 08/04/2015, 08/06/2016, 10/09/2017, 10/24/2018   Influenza,inj,quad, With Preservative 10/30/2017   PFIZER(Purple Top)SARS-COV-2 Vaccination 01/05/2020, 02/02/2020   Td 11/11/2007   Tdap 10/24/2018    Past Medical History:  Diagnosis Date   Anxiety    Arthritis    degeneration of spine & scoliosis   Bipolar disorder ([place])    Chest pain    GERD (gastroesophageal reflux disease)    H/O echocardiogram ?2015   Hyperlipidemia    Hypertension    Pneumonia 2002   [place] admission    Seizures ([place])    Shortness of breath dyspnea    Sleep apnea    test 3/15-waiting results-was told oxygen did drop- did additional study done at home, told that he doesn't have sleep apnea     Tobacco History: Social History   Tobacco Use  Smoking Status Every Day   Packs/day: 1.00   Years: 40.00   Total pack years: 40.00   Types: Cigarettes  Smokeless Tobacco Former   Ready to quit: Not Answered Counseling given: Not Answered   Continue to not smoke  Outpatient Encounter Medications as of 06/20/2022  Medication Sig   albuterol (VENTOLIN HFA) 108 (90 Base) MCG/ACT inhaler INHALE 1 TO 2 PUFFS INTO THE LUNGS EVERY 6 HOURS AS NEEDED FOR WHEEZING OR SHORTNESS OF BREATH.   ALPRAZolam (XANAX) 1 MG tablet Take 1 mg by mouth 4 (four) times daily.   B-D 3CC LUER-LOK SYR 22GX1-1/2 22G X 1-1/2" 3 ML MISC    Budeson-Glycopyrrol-Formoterol (BREZTRI AEROSPHERE) 160-9-4.8 MCG/ACT AERO Inhale 2 puffs  into the lungs in the morning and at bedtime.   budesonide-formoterol (SYMBICORT) 80-4.5 MCG/ACT inhaler Inhale 2 puffs into the lungs 2 (two) times daily.   fluticasone (FLONASE) 50 MCG/ACT nasal spray Place 2 sprays into both nostrils daily.   gabapentin (NEURONTIN) 100 MG capsule Take 100 mg by mouth See admin instructions. Take 100 mg 3 times daily, may take an additional 100 mg dose as bedtime as needed for pain   lamoTRIgine (LAMICTAL) 100 MG tablet Take 100 mg by mouth daily.   lisinopril (ZESTRIL) 20 MG tablet TAKE 1 TABLET BY MOUTH DAILY. Marland KitchenDOCTOR RETIRED. NEEDS NEW DOCTOR.   methocarbamol (ROBAXIN) 500  MG tablet Take 1 tablet (500 mg total) by mouth 3 (three) times daily as needed for muscle spasms.   naloxone (NARCAN) 4 MG/0.1ML LIQD nasal spray kit Place 4 mg into the nose as needed (opioid overdose). As directed   oxyCODONE-acetaminophen (PERCOCET) 10-325 MG tablet Take 1 tablet by mouth every 4 (four) hours as needed for pain.   pantoprazole (PROTONIX) 20 MG tablet Take 1 tablet (20 mg total) by mouth daily.   QUEtiapine (SEROQUEL) 200 MG tablet Take 200 mg by mouth at bedtime.   sertraline (ZOLOFT) 100 MG tablet Take 100 mg by mouth daily.   sildenafil (REVATIO) 20 MG tablet Take 2-5 tablets (40-100 mg total) by mouth daily as needed (for erectile dysfunction.).   simvastatin (ZOCOR) 40 MG tablet TAKE 1 TABLET BY MOUTH DAILY IN THE EVENING..DOCTOR RETIRED. NEEDS NEW DOCTOR.   tamsulosin (FLOMAX) 0.4 MG CAPS capsule Take 0.4 mg by mouth at bedtime.   testosterone cypionate (DEPOTESTOTERONE CYPIONATE) 100 MG/ML injection Inject 100 mg into the muscle once a week.   zolpidem (AMBIEN) 10 MG tablet Take 10 mg by mouth at bedtime.    montelukast (SINGULAIR) 10 MG tablet Take 1 tablet (10 mg total) by mouth at bedtime.   [DISCONTINUED] levofloxacin (LEVAQUIN) 500 MG tablet Take 1 tablet (500 mg total) by mouth daily.   No facility-administered encounter medications on file as of 06/20/2022.      Review of Systems  Review of Systems  N/a Physical Exam  BP 138/86   Pulse 92   Ht 5' 8"  (1.727 m)   Wt 226 lb (102.5 kg)   SpO2 97% Comment: on RA  BMI 34.36 kg/m   Wt Readings from Last 5 Encounters:  06/20/22 226 lb (102.5 kg)  06/06/22 225 lb 2 oz (102.1 kg)  05/07/22 215 lb 6 oz (97.7 kg)  03/30/22 210 lb 6 oz (95.4 kg)  03/21/22 175 lb (79.4 kg)    BMI Readings from Last 5 Encounters:  06/20/22 34.36 kg/m  06/06/22 34.23 kg/m  05/07/22 32.75 kg/m  03/30/22 31.99 kg/m  03/21/22 26.61 kg/m     Physical Exam General: Sitting in chair, no acute distress Eyes: EOMI, no icterus Neck: Supple, no JVP Pulmonary: Clear, normal work of breathing Cardiovascular: Warm, no edema Abdomen: Mild distention, bowel sounds are present MSK: No synovitis, no joint effusion Neuro: Normal gait, no weakness Psych: Normal mood, full affect  Assessment & Plan:   Recurrent pneumonia: 03/2022 and another abnormal chest x-ray 04/2022.  Episodes associate with severe shortness of breath, cough, some fever.  Most recent bleed treated outpatient with Levaquin.  With improved symptoms.  Unclear etiology why this is happening so close together.  Possibly related to severe smoking disease and abnormal immune system in the lungs.  Can repeat chest imaging at next visit to assess for improvement.  Notably, abnormal imaging 04/2022 appears different and in a different location than prior pneumonia.  Low suspicion for malignancy.  Abnormal CT imaging: Associated with acute illness, viral versus bacterial.  Infectious symptoms.  Scattered groundglass opacities with peribronchovascular distribution 03/2022 CT scan.  Suddenly markedly improved with areas that appear to be scarring and some areas of previous infiltrate likely related to ongoing healing on CT scan 04/2022.  Smoking assessment and cessation counseling I have advised the patient to quit/stop smoking as soon as possible due to high  risk for multiple medical problems.  It will also be very difficult for [place] to manage patient's  respiratory symptoms and  status if we continue to expose her lungs to a known irritant.  We do not advise e-cigarettes as a form of stopping smoking. Patient is  willing to quit smoking. I have advised the patient that we can assist and have options of nicotine replacement therapy, provided smoking cessation education today, provided smoking cessation counseling, and provided cessation resources. Follow-up next office visit office visit for assessment of smoking cessation.  I spent 5 minutes in smoking cessation counseling.   Return in about 2 months (around 08/20/2022).   Lanier Clam, MD 06/21/2022

## 2022-07-04 ENCOUNTER — Ambulatory Visit: Payer: BLUE CROSS/BLUE SHIELD | Admitting: Nurse Practitioner

## 2022-07-11 ENCOUNTER — Ambulatory Visit: Payer: BLUE CROSS/BLUE SHIELD | Admitting: Nurse Practitioner

## 2022-07-19 ENCOUNTER — Telehealth: Payer: Self-pay | Admitting: Nurse Practitioner

## 2022-07-19 NOTE — Telephone Encounter (Signed)
I Called and spoke to Dr. Carney Bern at [facility]. He mentioned that the most recent drug screen came back abnormal and he is waiting for the confirmation drug screen to return. States the most recent UDS was on 07/09/2022. 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 According to [facility] a 5 day supply was written on 07/09/2022.   I can offer patient withdrawal medications if needed such as something for nausea. I am not going to be able to write him pain medication as he traditionally has an agreement with his pain provider.   If he is in great pain he can be seen in the emergency department and they can try to manage his pain.

## 2022-07-19 NOTE — Telephone Encounter (Signed)
Spoke with patient. Patient states he has been trying to get his pain medication refilled for 2 weeks with [facility] on [address] Joycie Peek does not know the name but he is just goes by Dr Raliegh Ip in pain management center. He has been cutting his tablets in half, quarters to last but ran out on 07/13/22. He is having nausea, vomiting, headache, vomiting, severe back pain (due for 2 more back surgeries), not sleeping. He was told at [facility] that they do not have his urine test back in yet that was done 2 weeks ago, he keeps getting run around and been told medication was sent in but then it is not there and patient feels awful from withdrawal and extreme back pain. I spoke with Romilda Garret and he advised patient to reach out to [facility] but patient states he has done that several times today and in the last few days. Matt stated he would not be able to prescribe pain medication but may be able to send something for nausea. Patient started crying stating " why do you guys do this to me? I have been coming to you for awhile, [name] told me he did pain management before. I want to feel better and I just can not take this anymore. I do not want to go to the street to get medication and get addicted to that and what is put in there. I can not go on like this." I asked patient if he was with someone and he was. Patient continued to cry on the phone. I advised patient [name] would call Romelle Starcher to see if we can help and I asked patient to continue staying with his friend and that we would call him back.  No triage nurses were available in the office during this call.

## 2022-07-19 NOTE — Telephone Encounter (Signed)
Called patient reviewed information. Patient declined medications for withdraw. Became very agitated requesting script for soboxone. Advised that is not something that is managed in PCP setting. Advised patient again that it is advised that if he is in pain and does not want treatment that [name] feels comfortable giving in this situation he can go to ED. Patient responded with "thanks that will take 3 hours" and call was disconnected by patient.  No attempt was made to call patient back.

## 2022-07-19 NOTE — Telephone Encounter (Signed)
Patient is scheduled to see [name] tomorrow 07/20/22/he called in today asking can medicationoxyCODONE-acetaminophen (PERCOCET) 10-325 MG tablet  be called in for him? He stated that he is withdrawing really bad. The medication was prescribed form somewhere else and they are giving him a hard time about prescribing the medication for him he is wondering is there anything [name] can do to help him?

## 2022-07-20 ENCOUNTER — Ambulatory Visit: Payer: BLUE CROSS/BLUE SHIELD | Admitting: Nurse Practitioner

## 2022-07-31 ENCOUNTER — Emergency Department (HOSPITAL_COMMUNITY): Payer: BLUE CROSS/BLUE SHIELD

## 2022-07-31 ENCOUNTER — Emergency Department (HOSPITAL_COMMUNITY)
Admission: EM | Admit: 2022-07-31 | Discharge: 2022-08-01 | Disposition: A | Discharge status: Other Acute Inpatient Hospital | Payer: BLUE CROSS/BLUE SHIELD | Attending: Emergency Medicine | Admitting: Emergency Medicine

## 2022-07-31 ENCOUNTER — Encounter (HOSPITAL_COMMUNITY): Payer: Self-pay

## 2022-07-31 DIAGNOSIS — S41101A Unspecified open wound of right upper arm, initial encounter: Secondary | ICD-10-CM | POA: Diagnosis not present

## 2022-07-31 DIAGNOSIS — R000 Tachycardia, unspecified: Secondary | ICD-10-CM | POA: Diagnosis not present

## 2022-07-31 DIAGNOSIS — F319 Bipolar disorder, unspecified: Secondary | ICD-10-CM | POA: Diagnosis present

## 2022-07-31 DIAGNOSIS — F1120 Opioid dependence, uncomplicated: Secondary | ICD-10-CM | POA: Diagnosis not present

## 2022-07-31 DIAGNOSIS — Z20822 Contact with and (suspected) exposure to covid-19: Secondary | ICD-10-CM | POA: Diagnosis not present

## 2022-07-31 DIAGNOSIS — R45851 Suicidal ideations: Secondary | ICD-10-CM | POA: Diagnosis not present

## 2022-07-31 DIAGNOSIS — Z79899 Other long term (current) drug therapy: Secondary | ICD-10-CM | POA: Insufficient documentation

## 2022-07-31 DIAGNOSIS — F1520 Other stimulant dependence, uncomplicated: Secondary | ICD-10-CM | POA: Diagnosis not present

## 2022-07-31 DIAGNOSIS — S41102A Unspecified open wound of left upper arm, initial encounter: Secondary | ICD-10-CM | POA: Diagnosis not present

## 2022-07-31 DIAGNOSIS — X58XXXA Exposure to other specified factors, initial encounter: Secondary | ICD-10-CM | POA: Insufficient documentation

## 2022-07-31 DIAGNOSIS — S2232XA Fracture of one rib, left side, initial encounter for closed fracture: Secondary | ICD-10-CM

## 2022-07-31 DIAGNOSIS — F17210 Nicotine dependence, cigarettes, uncomplicated: Secondary | ICD-10-CM | POA: Diagnosis not present

## 2022-07-31 DIAGNOSIS — S4992XA Unspecified injury of left shoulder and upper arm, initial encounter: Secondary | ICD-10-CM | POA: Diagnosis present

## 2022-07-31 DIAGNOSIS — I10 Essential (primary) hypertension: Secondary | ICD-10-CM | POA: Diagnosis not present

## 2022-07-31 DIAGNOSIS — F1920 Other psychoactive substance dependence, uncomplicated: Secondary | ICD-10-CM | POA: Diagnosis present

## 2022-07-31 LAB — CBC
HCT: 48.1 % (ref 39.0–52.0)
Hemoglobin: 16 g/dL (ref 13.0–17.0)
MCH: 30.2 pg (ref 26.0–34.0)
MCHC: 33.3 g/dL (ref 30.0–36.0)
MCV: 90.9 fL (ref 80.0–100.0)
Platelets: 334 10*3/uL (ref 150–400)
RBC: 5.29 MIL/uL (ref 4.22–5.81)
RDW: 13.2 % (ref 11.5–15.5)
WBC: 19.5 10*3/uL — ABNORMAL HIGH (ref 4.0–10.5)
nRBC: 0 % (ref 0.0–0.2)

## 2022-07-31 LAB — COMPREHENSIVE METABOLIC PANEL
ALT: 24 U/L (ref 0–44)
AST: 38 U/L (ref 15–41)
Albumin: 4.6 g/dL (ref 3.5–5.0)
Alkaline Phosphatase: 64 U/L (ref 38–126)
Anion gap: 7 (ref 5–15)
BUN: 22 mg/dL — ABNORMAL HIGH (ref 6–20)
CO2: 20 mmol/L — ABNORMAL LOW (ref 22–32)
Calcium: 9 mg/dL (ref 8.9–10.3)
Chloride: 108 mmol/L (ref 98–111)
Creatinine, Ser: 0.94 mg/dL (ref 0.61–1.24)
GFR, Estimated: >60 mL/min (ref >60)
Glucose, Bld: 99 mg/dL (ref 70–99)
Potassium: 3.7 mmol/L (ref 3.5–5.1)
Sodium: 135 mmol/L (ref 135–145)
Total Bilirubin: 0.9 mg/dL (ref 0.3–1.2)
Total Protein: 7.7 g/dL (ref 6.5–8.1)

## 2022-07-31 LAB — CARBAMAZEPINE LEVEL, TOTAL: Carbamazepine Lvl: <2 ug/mL — ABNORMAL LOW (ref 4.0–12.0)

## 2022-07-31 LAB — SALICYLATE LEVEL: Salicylate Lvl: <7 mg/dL — ABNORMAL LOW (ref 7.0–30.0)

## 2022-07-31 LAB — RAPID URINE DRUG SCREEN, HOSP PERFORMED
Amphetamines: POSITIVE — AB
Barbiturates: NOT DETECTED
Benzodiazepines: POSITIVE — AB
Cocaine: NOT DETECTED
Opiates: POSITIVE — AB
Tetrahydrocannabinol: POSITIVE — AB

## 2022-07-31 LAB — RESP PANEL BY RT-PCR (FLU A&B, COVID) ARPGX2
Influenza A by PCR: NEGATIVE
Influenza B by PCR: NEGATIVE
SARS Coronavirus 2 by RT PCR: NEGATIVE

## 2022-07-31 LAB — ACETAMINOPHEN LEVEL: Acetaminophen (Tylenol), Serum: <10 ug/mL — ABNORMAL LOW (ref 10–30)

## 2022-07-31 LAB — ETHANOL: Alcohol, Ethyl (B): <10 mg/dL (ref <10)

## 2022-07-31 MED ORDER — LORAZEPAM 2 MG/ML IJ SOLN
INTRAMUSCULAR | Status: AC
  Administered 2022-07-31: 1 mg via INTRAVENOUS
  Filled 2022-07-31: qty 1

## 2022-07-31 MED ORDER — MOMETASONE FURO-FORMOTEROL FUM 100-5 MCG/ACT IN AERO
2.0000 | INHALATION_SPRAY | Freq: Two times a day (BID) | RESPIRATORY_TRACT | Status: DC
  Administered 2022-07-31 – 2022-08-01 (×2): 2 via RESPIRATORY_TRACT
  Filled 2022-07-31: qty 8.8

## 2022-07-31 MED ORDER — SERTRALINE HCL 50 MG PO TABS
100.0000 mg | ORAL_TABLET | Freq: Every day | ORAL | Status: DC
  Administered 2022-07-31: 100 mg via ORAL
  Filled 2022-07-31: qty 2

## 2022-07-31 MED ORDER — LORAZEPAM 2 MG/ML IJ SOLN: 1.0000 mg | Freq: Once | INTRAMUSCULAR | Status: AC

## 2022-07-31 MED ORDER — NICOTINE 14 MG/24HR TD PT24
14.0000 mg | MEDICATED_PATCH | Freq: Once | TRANSDERMAL | Status: DC
  Administered 2022-07-31: 14 mg via TRANSDERMAL
  Filled 2022-07-31: qty 1

## 2022-07-31 MED ORDER — FLUTICASONE PROPIONATE 50 MCG/ACT NA SUSP
2.0000 | Freq: Every day | NASAL | Status: DC
  Filled 2022-07-31: qty 16

## 2022-07-31 MED ORDER — LACTATED RINGERS IV BOLUS
1000.0000 mL | Freq: Once | INTRAVENOUS | Status: AC
  Administered 2022-07-31: 1000 mL via INTRAVENOUS

## 2022-07-31 MED ORDER — NICOTINE 21 MG/24HR TD PT24
21.0000 mg | MEDICATED_PATCH | Freq: Every day | TRANSDERMAL | Status: DC
  Administered 2022-07-31: 21 mg via TRANSDERMAL
  Filled 2022-07-31: qty 1

## 2022-07-31 MED ORDER — HALOPERIDOL LACTATE 5 MG/ML IJ SOLN
5.0000 mg | Freq: Once | INTRAMUSCULAR | Status: AC
  Administered 2022-07-31: 5 mg via INTRAVENOUS
  Filled 2022-07-31: qty 1

## 2022-07-31 MED ORDER — OXYCODONE HCL 5 MG PO TABS
10.0000 mg | ORAL_TABLET | Freq: Once | ORAL | Status: AC
  Administered 2022-07-31: 10 mg via ORAL
  Filled 2022-07-31: qty 2

## 2022-07-31 MED ORDER — LORAZEPAM 2 MG/ML IJ SOLN: 1.0000 mg | Freq: Once | INTRAMUSCULAR | Status: DC

## 2022-07-31 MED ORDER — CEPHALEXIN 500 MG PO CAPS
500.0000 mg | ORAL_CAPSULE | Freq: Four times a day (QID) | ORAL | Status: DC
  Administered 2022-07-31: 500 mg via ORAL
  Filled 2022-07-31: qty 1

## 2022-07-31 MED ORDER — KETOROLAC TROMETHAMINE 30 MG/ML IJ SOLN
30.0000 mg | Freq: Once | INTRAMUSCULAR | Status: AC
  Administered 2022-07-31: 30 mg via INTRAVENOUS
  Filled 2022-07-31: qty 1

## 2022-07-31 MED ORDER — CARBAMAZEPINE ER 200 MG PO TB12
200.0000 mg | ORAL_TABLET | Freq: Two times a day (BID) | ORAL | Status: DC
  Administered 2022-07-31 (×2): 200 mg via ORAL
  Filled 2022-07-31 (×2): qty 1

## 2022-07-31 MED ORDER — ALPRAZOLAM 0.5 MG PO TABS
1.0000 mg | ORAL_TABLET | Freq: Four times a day (QID) | ORAL | Status: DC
  Administered 2022-07-31 (×3): 1 mg via ORAL
  Filled 2022-07-31 (×3): qty 2

## 2022-07-31 MED ORDER — LISINOPRIL 10 MG PO TABS
20.0000 mg | ORAL_TABLET | Freq: Once | ORAL | Status: AC
  Administered 2022-07-31: 20 mg via ORAL
  Filled 2022-07-31: qty 2

## 2022-07-31 MED ORDER — LORAZEPAM 1 MG PO TABS
1.0000 mg | ORAL_TABLET | Freq: Once | ORAL | Status: AC
  Administered 2022-07-31: 1 mg via ORAL
  Filled 2022-07-31: qty 1

## 2022-07-31 MED ORDER — LAMOTRIGINE 100 MG PO TABS
100.0000 mg | ORAL_TABLET | Freq: Every day | ORAL | Status: DC
  Administered 2022-07-31: 100 mg via ORAL
  Filled 2022-07-31: qty 1

## 2022-07-31 MED ORDER — LORAZEPAM 2 MG/ML IJ SOLN
INTRAMUSCULAR | Status: AC
  Filled 2022-07-31: qty 1

## 2022-07-31 NOTE — ED Provider Notes (Signed)
Accepted handoff at shift change from [facility]. Please see prior provider note for more detail.   Briefly: Patient is 56 y.o. presents for SI and auditory hallucinations. Does have plan to shoot himself in the head.  DDX: concern for SI, acute psychosis, and electrolyte derangement  Plan: fluid bolus for tachycardia, f/u UDS, cellulitis started on keflex, IVC, psych hold and TTS eval    Physical Exam  BP (!) 121/93   Pulse (!) 123   Temp 98.6 F (37 C) (Oral)   Resp 18   Ht '5\' 8"'$  (1.727 m)   Wt 106.6 kg   SpO2 96%   BMI 35.73 kg/m   Physical Exam  Procedures  Procedures  ED Course / MDM   Clinical Course as of 07/31/22 1424  Tue Jul 31, 2022  0830 Generalized seizure treated with 2 mg of Ativan.  Also restarted his carbamazepine. [JR]  1356 There is seizure activity since this morning. [JR]    Clinical Course User Index [JR] Harriet Pho, PA-C   Medical Decision Making Amount and/or Complexity of Data Reviewed Labs: ordered. Radiology: ordered.  Risk OTC drugs. Prescription drug management.   Just after evaluation this morning patient had a seizure. Treated with Ativan.  His tachycardia improved with LR bolus. No seizure activity for the rest of the morning and early afternoon. Started home meds. TTS consulted. Ordered psych hold.       Harriet Pho, PA-C 07/31/22 [place], MD 08/01/22 [id number]

## 2022-07-31 NOTE — ED Provider Notes (Signed)
  Physical Exam  BP (!) 115/100   Pulse (!) 113   Temp 98.1 F (36.7 C) (Oral)   Resp (!) 26   Ht '5\' 8"'$  (1.727 m)   Wt 106.6 kg   SpO2 98%   BMI 35.73 kg/m   Physical Exam  Procedures  Procedures  ED Course / MDM   Clinical Course as of 07/31/22 1648  Tue Jul 31, 2022  0830 Generalized seizure treated with 2 mg of Ativan.  Also restarted his carbamazepine. [JR]  1356 There is seizure activity since this morning. [JR]    Clinical Course User Index [JR] Harriet Pho, PA-C   Medical Decision Making I was called by psychiatry regarding medical clearance.  Patient had a seizure this morning and in that context, his white blood cell count is 19.  His white blood cell count usually is elevated around 20.  Patient was given his seizure meds.  Patient also has opiates in the phentermine and benzos in his system.  Patient is actually medically cleared by previous provider.  Patient is suicidal also is pending psych eval.  Of note, patient has a single rib fracture on the left side but has no head injury and single rib fracture does not need any admission and patient has no oxygen requirement.  Amount and/or Complexity of Data Reviewed Labs: ordered. Radiology: ordered.  Risk OTC drugs. Prescription drug management.          Drenda Freeze, MD 07/31/22 [phone]

## 2022-07-31 NOTE — ED Notes (Signed)
Called to room, pt was found sitting in a chair having seizure like activity, pt had voided on self, pt appeared to be in full tonic/clonic seizure, two rn and tech lifted pt back into bed, pt was placed in a recovery position and placed on a non rebreather, pt appears to have bit his tongue, pt has bright red blood from his mouth, pt remained rigid, seizure lasted aprox 20 seconds, pt the became very combative, pt attempting to kick nurse tech, pt grabbing at RN and yelling, pt attempting to hit and kick staff, MD at bedside, I'm ativan given

## 2022-07-31 NOTE — ED Notes (Signed)
Pt attempted to use urinal in room. This RN entered to find him rinsing out the urinal, stating he had only been able to 'get a few drops'. Pt aware of need for urine sample

## 2022-07-31 NOTE — Progress Notes (Addendum)
Pt was accepted to [facility] 08/01/22; [place]   Pt meets inpatient criteria per Delfin Gant, NP   Attending Physician will be Dr. Jonelle Sports  Report can be called to: [phone] -Pager   Pt can arrive after 8:00am  Care Team notified: Delfin Gant, NP, and Jeanie Sewer, RN  Nadara Mode, [place] 07/31/2022 @ 10:25 PM
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
## 2022-07-31 NOTE — ED Notes (Addendum)
Patient to room 28. Patient ambulated  to room.   Patient wanded by security. Patient calm and cooperative.  Patient oriented to unit and room.

## 2022-07-31 NOTE — Progress Notes (Signed)
Inpatient Behavioral Health Placement  Pt meets inpatient criteria per Delfin Gant, NP . There are no available beds at [facility] per [facility] Wynonia Hazard, RN.  Referral was sent to the following facilities;   Destination  Service Provider Address Phone Fax  [facility]  [place], [place] [address] [place]  [facility]  [address] [place] [address] [phone]  [facility]  [address] [address] [place] [address] [place]  [facility]  [address] [place] [address] [phone]  [facility]  [address] [place]  [place] [address] [phone]  [place] [address] [place] [address] [phone]  [facility]  [address], [place] [address] [phone] [place]  [address] [place] [address] [phone] [facility]  [address] [place] [address] [phone]  [facility]  [address], [place] [address] [phone]  [facility]  [address] [place], [place] [address] [place]  [facility]  [address], [place] [address] [place]  [facility]  [address] [place] [address] [phone]  [facility]  [address] [place] [address] [facility]  [place], [place] [address] [phone] [facility]  [address], [place] [address] [phone]    Situation ongoing,  CSW will follow up.   Benjaman Kindler,  MSW, LCSWA 07/31/2022  @ 10:21 PM

## 2022-07-31 NOTE — ED Provider Notes (Signed)
[facility] DEPT Provider Note   CSN: [id number] Arrival date & time: 07/31/22  0329     History  Chief Complaint  Patient presents with   Hallucinations   IVC    [name] is a 56 y.o. male.  56 year old male with past medical history significant for bipolar presents today for evaluation of suicidal ideation, and auditory hallucinations.  He states this has been going on for few weeks.  He denies homicidal ideation.  He states "I hate my wife", but I need her around for my child.  He states about 1 month ago his provider changed his medication.  He does report compliance with his medications.  He states he is taking a new medication that starts with a "Z", and is a blue and white pill.  States he has access to a gun which he has been carrying for the last couple weeks.  His plan is to shoot himself in the head.  When asked about his auditory hallucinations he does not want to elaborate.  I asked if he is currently hearing any voices he states "yes".  The history is provided by the patient. No language interpreter was used.       Home Medications Prior to Admission medications   Medication Sig Start Date End Date Taking? Authorizing Provider  CAPLYTA 42 MG capsule Take 42 mg by mouth at bedtime. 07/25/22  Yes [provider]  carbamazepine (TEGRETOL XR) 200 MG 12 hr tablet Take 200 mg by mouth 2 (two) times daily. 06/26/22  Yes [provider]  albuterol (VENTOLIN HFA) 108 (90 Base) MCG/ACT inhaler INHALE 1 TO 2 PUFFS INTO THE LUNGS EVERY 6 HOURS AS NEEDED FOR WHEEZING OR SHORTNESS OF BREATH. 05/31/22   Michela Pitcher, NP  ALPRAZolam Duanne Moron) 1 MG tablet Take 1 mg by mouth 4 (four) times daily.    [provider]  B-D 3CC LUER-LOK SYR 22GX1-1/2 22G X 1-1/2" 3 ML MISC  08/14/19   [provider]  Budeson-Glycopyrrol-Formoterol (BREZTRI AEROSPHERE) 160-9-4.8 MCG/ACT AERO Inhale 2 puffs into the lungs in the morning and  at bedtime. 06/20/22   Hunsucker, Bonna Gains, MD  budesonide-formoterol (SYMBICORT) 80-4.5 MCG/ACT inhaler Inhale 2 puffs into the lungs 2 (two) times daily. 05/31/22   Michela Pitcher, NP  fluticasone (FLONASE) 50 MCG/ACT nasal spray Place 2 sprays into both nostrils daily. 04/29/19   Elby Beck, FNP  gabapentin (NEURONTIN) 100 MG capsule Take 100 mg by mouth See admin instructions. Take 100 mg 3 times daily, may take an additional 100 mg dose as bedtime as needed for pain 12/27/20   [provider]  lamoTRIgine (LAMICTAL) 100 MG tablet Take 100 mg by mouth daily. 06/04/22   [provider]  lisinopril (ZESTRIL) 20 MG tablet TAKE 1 TABLET BY MOUTH DAILY. Marland KitchenDOCTOR RETIRED. NEEDS NEW DOCTOR. 05/14/22   Michela Pitcher, NP  methocarbamol (ROBAXIN) 500 MG tablet Take 1 tablet (500 mg total) by mouth 3 (three) times daily as needed for muscle spasms. 08/17/15   Melina Schools, MD  montelukast (SINGULAIR) 10 MG tablet Take 1 tablet (10 mg total) by mouth at bedtime. 03/25/22 05/24/22  Bonnell Public, MD  naloxone [facility]) 4 MG/0.1ML LIQD nasal spray kit Place 4 mg into the nose as needed (opioid overdose). As directed    [provider]  oxyCODONE-acetaminophen (PERCOCET) 10-325 MG tablet Take 1 tablet by mouth every 4 (four) hours as needed for pain. 08/17/15   Rolena Infante,  Dahari, MD  pantoprazole (PROTONIX) 20 MG tablet Take 1 tablet (20 mg total) by mouth daily. 01/11/21   Kennyth Arnold, FNP  QUEtiapine (SEROQUEL) 200 MG tablet Take 200 mg by mouth at bedtime.    [provider]  sertraline (ZOLOFT) 100 MG tablet Take 100 mg by mouth daily.    [provider]  sildenafil (REVATIO) 20 MG tablet Take 2-5 tablets (40-100 mg total) by mouth daily as needed (for erectile dysfunction.). 06/02/20   Elby Beck, FNP  simvastatin (ZOCOR) 40 MG tablet TAKE 1 TABLET BY MOUTH DAILY IN THE EVENING..DOCTOR RETIRED. NEEDS NEW DOCTOR. 04/17/22   Michela Pitcher, NP  tamsulosin  (FLOMAX) 0.4 MG CAPS capsule Take 0.4 mg by mouth at bedtime.    [provider]  testosterone cypionate (DEPOTESTOTERONE CYPIONATE) 100 MG/ML injection Inject 100 mg into the muscle once a week. 11/19/19   [provider]  zolpidem (AMBIEN) 10 MG tablet Take 10 mg by mouth at bedtime.  09/16/17   [provider]      Allergies    Patient has no known allergies.    Review of Systems   Review of Systems  Musculoskeletal:  Negative for arthralgias.  Psychiatric/Behavioral:  Positive for hallucinations and suicidal ideas.   All other systems reviewed and are negative.   Physical Exam Updated Vital Signs BP (!) 157/92 (BP Location: Right Arm)   Pulse (!) 123   Temp 98.6 F (37 C) (Oral)   Resp 20   Ht _0  (1.727 m)   Wt 106.6 kg   SpO2 98%   BMI 35.73 kg/m  Physical Exam Vitals and nursing note reviewed.  Constitutional:      General: He is not in acute distress.    Appearance: Normal appearance. He is not ill-appearing.  HENT:     Head: Normocephalic and atraumatic.     Nose: Nose normal.  Eyes:     Conjunctiva/sclera: Conjunctivae normal.  Pulmonary:     Effort: Pulmonary effort is normal. No respiratory distress.  Musculoskeletal:        General: No deformity.  Skin:    Findings: No rash.  Neurological:     Mental Status: He is alert.  Psychiatric:        Attention and Perception: Attention normal.        Speech: Speech normal.        Behavior: Behavior is not agitated, aggressive, hyperactive or combative.        Thought Content: Thought content includes suicidal ideation. Thought content does not include homicidal ideation. Thought content includes suicidal plan. Thought content does not include homicidal plan.     ED Results / Procedures / Treatments   Labs (all labs ordered are listed, but only abnormal results are displayed) Labs Reviewed  COMPREHENSIVE METABOLIC PANEL  ETHANOL  SALICYLATE LEVEL  ACETAMINOPHEN LEVEL  CBC   RAPID URINE DRUG SCREEN, HOSP PERFORMED    EKG None  Radiology No results found.  Procedures Procedures    Medications Ordered in ED Medications - No data to display  ED Course/ Medical Decision Making/ A&P                           Medical Decision Making Amount and/or Complexity of Data Reviewed Labs: ordered.  Risk Prescription drug management.   56 year old male presenting today for evaluation of auditory hallucinations and suicidal ideation.  Reports recent medication change about 1 month  ago.  He is unsure of what medication exactly.  Endorses SI.  Has access to a gun.  Denies HI.  Endorses auditory hallucinations.  Does have history of bipolar.  Denies chest pain, shortness of breath, cough.  He does have lesions to bilateral upper extremities with couple superficial open wounds.  Full range of motion of bilateral wrists.  No significant warmth or tenderness to palpation over this area.  He is afebrile.  Likely cellulitis.  Leukocytosis of 19.5.  Will order Keflex 4 times daily.  CMP without acute concerns.  Acetaminophen, salicylate, ethanol levels within normal.  EKG shows sinus tachycardia.  No acute ischemic changes. Patient remains tachycardic to about 120s.  Will give fluid bolus.  Patient at the end of my shift is awaiting fluid bolus, UDS before medical clearance for TTS. Patient discussed and evaluated by attending. Patient signed out to Joanette Gula PA-C.   Final Clinical Impression(s) / ED Diagnoses Final diagnoses:  Suicidal ideations    Rx / DC Orders ED Discharge Orders     None         Evlyn Courier, PA-C 07/31/22 8412    Maudie Flakes, MD 08/01/22 [phone]

## 2022-07-31 NOTE — ED Triage Notes (Signed)
Pt presents with IVC, reports auditory hallucinations. Thinks his family has been shot, they are at home. States his meds have been changed recently. Cooperative in triage.

## 2022-08-01 MED ORDER — SIMVASTATIN 20 MG PO TABS: 40.0000 mg | ORAL_TABLET | Freq: Every day | ORAL | Status: DC

## 2022-08-01 MED ORDER — GABAPENTIN 100 MG PO CAPS: 100.0000 mg | ORAL_CAPSULE | ORAL | Status: DC

## 2022-08-01 MED ORDER — TAMSULOSIN HCL 0.4 MG PO CAPS: 0.4000 mg | ORAL_CAPSULE | Freq: Every day | ORAL | Status: DC

## 2022-08-01 MED ORDER — QUETIAPINE FUMARATE 100 MG PO TABS: 200.0000 mg | ORAL_TABLET | Freq: Every day | ORAL | Status: DC

## 2022-08-01 MED ORDER — NAPROXEN 500 MG PO TABS
500.0000 mg | ORAL_TABLET | Freq: Two times a day (BID) | ORAL | Status: DC | PRN
  Administered 2022-08-01: 500 mg via ORAL
  Filled 2022-08-01: qty 1

## 2022-08-01 MED ORDER — ALBUTEROL SULFATE HFA 108 (90 BASE) MCG/ACT IN AERS: 2.0000 | INHALATION_SPRAY | RESPIRATORY_TRACT | Status: DC | PRN

## 2022-08-01 MED ORDER — GABAPENTIN 100 MG PO CAPS: 100.0000 mg | ORAL_CAPSULE | Freq: Three times a day (TID) | ORAL | Status: DC

## 2022-08-01 MED ORDER — LIP MEDEX EX OINT
TOPICAL_OINTMENT | Freq: Once | CUTANEOUS | Status: AC
  Administered 2022-08-01: 75 via TOPICAL
  Filled 2022-08-01: qty 7

## 2022-08-01 NOTE — ED Notes (Signed)
Call from pt wife who stated that he has a very addictive personality and she further states that he opens his gabapentin and snorts it and well crushing oxycodone pill that he gets from a friend and snorts them as well. She also confided that 3 weeks ago require narcan  d/t fentanyl  overdose.

## 2022-08-01 NOTE — ED Notes (Signed)
Called [phone] to give report, number was a pager, left call back number.

## 2022-08-01 NOTE — ED Notes (Signed)
Pt was accepted to [facility]; [place]   Pt meets inpatient criteria per Delfin Gant, NP   Attending Physician will be Dr. Jonelle Sports  Report can be called to: [phone] -Pager   Pt can arrive after 8:00am

## 2022-08-01 NOTE — ED Notes (Signed)
Pt sleeping no distress noted respirations are easy.

## 2022-08-01 NOTE — ED Notes (Signed)
Currently appears asleep and his respirations appear WNL.

## 2022-08-01 NOTE — ED Notes (Signed)
Sleeping respirations are easy skin color WNL no distress noted.

## 2022-08-01 NOTE — ED Notes (Signed)
[facility] transport notified

## 2022-08-01 NOTE — ED Notes (Signed)
Medicated for reported neck pain currently denies SI or HI  alert x3

## 2022-08-01 NOTE — ED Provider Notes (Signed)
Emergency Medicine Observation Re-evaluation Note  [name] is a 56 y.o. male, seen on rounds today.  Pt initially presented to the ED for complaints of Hallucinations and IVC Currently, the patient is awaiting placement  Physical Exam  BP (!) 137/96 (BP Location: Right Arm)   Pulse (!) 112   Temp 98.4 F (36.9 C) (Oral)   Resp 20   Ht '5\' 8"'$  (1.727 m)   Wt 106.6 kg   SpO2 95%   BMI 35.73 kg/m  Physical Exam General: Calm Cardiac: well perfused Lungs: even respirations Psych: calm  ED Course / MDM  EKG:EKG Interpretation  Date/Time:  Tuesday July 31 2022 06:35:34 EST Ventricular Rate:  120 PR Interval:  122 QRS Duration: 93 QT Interval:  307 QTC Calculation: 434 R Axis:   81 Text Interpretation: Sinus tachycardia Probable left atrial enlargement RSR' in V1 or V2, right VCD or RVH Confirmed by Blanchie Dessert [phone]) on 07/31/2022 8:37:50 AM  I have reviewed the labs performed to date as well as medications administered while in observation.  Recent changes in the last 24 hours include patient to be transferred today.  Plan  Current plan is for transfer to [facility]. Dr. Selinda Flavin is the accepting. Margette Fast, MD 08/07/22 [phone]

## 2022-08-20 ENCOUNTER — Ambulatory Visit: Payer: BLUE CROSS/BLUE SHIELD | Admitting: Pulmonary Disease

## 2022-09-06 ENCOUNTER — Other Ambulatory Visit: Payer: Self-pay | Admitting: Nurse Practitioner

## 2022-09-06 DIAGNOSIS — R0982 Postnasal drip: Secondary | ICD-10-CM

## 2022-09-11 ENCOUNTER — Other Ambulatory Visit: Payer: Self-pay | Admitting: Nurse Practitioner

## 2022-09-11 DIAGNOSIS — R0982 Postnasal drip: Secondary | ICD-10-CM

## 2022-10-14 ENCOUNTER — Telehealth: Payer: Self-pay | Admitting: Nurse Practitioner

## 2022-10-14 NOTE — Telephone Encounter (Signed)
Got notice that patient has gotten a script filled for atorvastatin and simvastatin. We need to verify that he is not taking both medications. If he is he needs to discontinue one and we need to update the chart

## 2022-10-15 NOTE — Telephone Encounter (Signed)
LVM for pt to return call

## 2022-10-22 IMAGING — CT CT ANGIO CHEST
2 of 6 series · 18 of 46 positions shown · IV contrast (omnipaque)
Comparison: Chest radiograph November 08, 2020

CLINICAL DATA: Chest pain

EXAM:
CT ANGIOGRAPHY CHEST WITH CONTRAST
TECHNIQUE: Multidetector CT imaging of the chest was performed using the
standard protocol during bolus administration of intravenous
contrast. Multiplanar CT image reconstructions and MIPs were
obtained to evaluate the vascular anatomy.
CONTRAST:  100mL OMNIPAQUE IOHEXOL 350 MG/ML SOLN

[Series 6: thins · axial · 0.78mm/px · z∈[+1054,+1348]mm · 15 of 323 slices shown]
[im 15/323  lung]
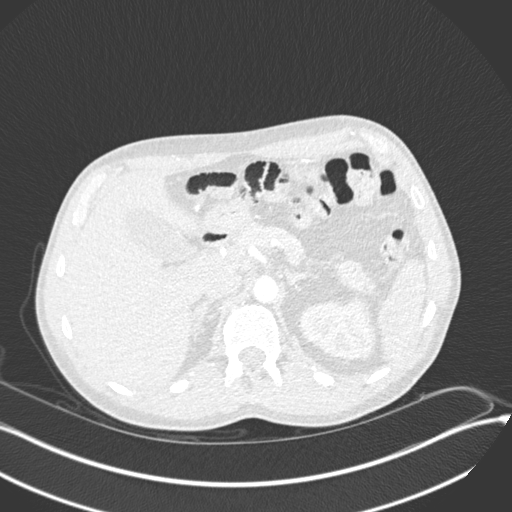
[im 43/323  soft-tissue]
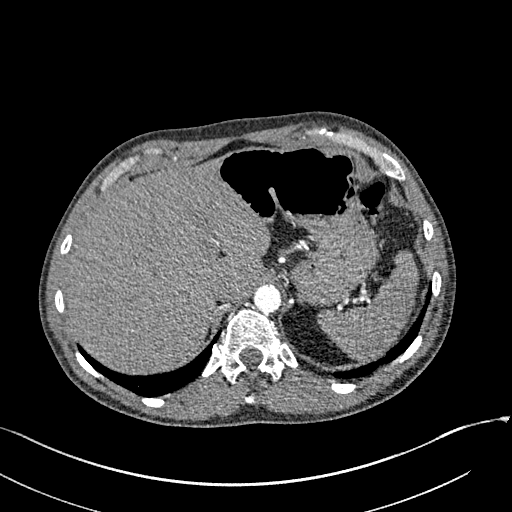
[im 57/323  lung]
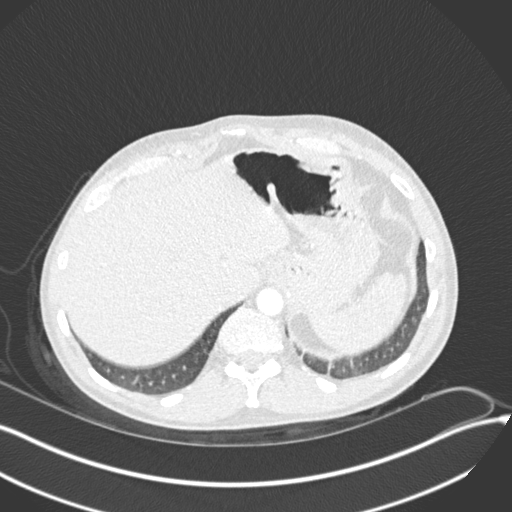
[im 85/323  soft-tissue]
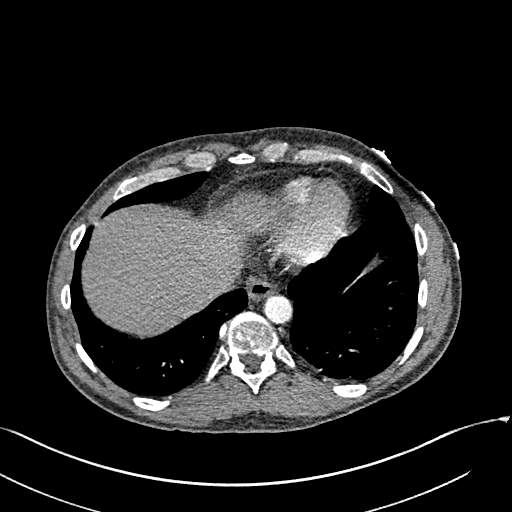
[im 99/323  lung]
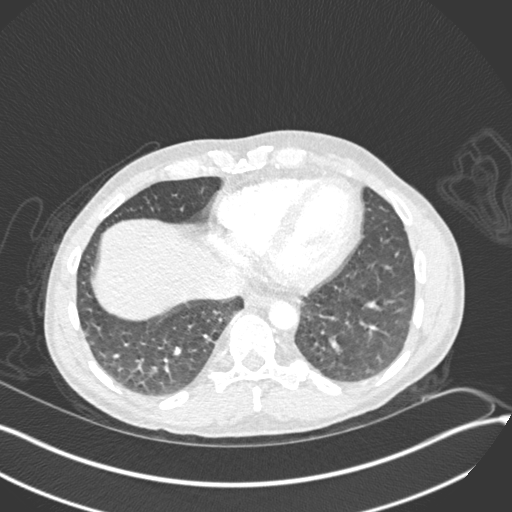
[im 127/323  soft-tissue]
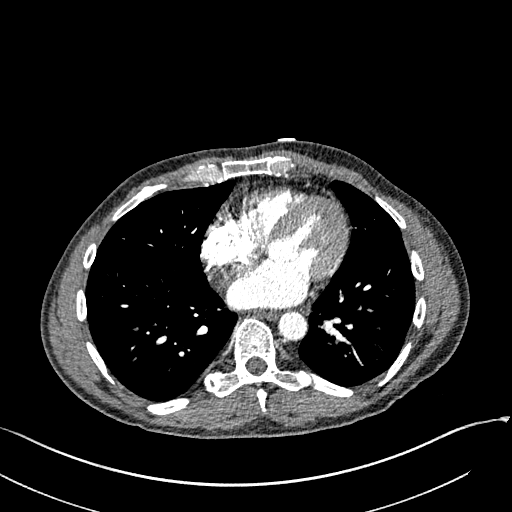
[im 141/323  lung]
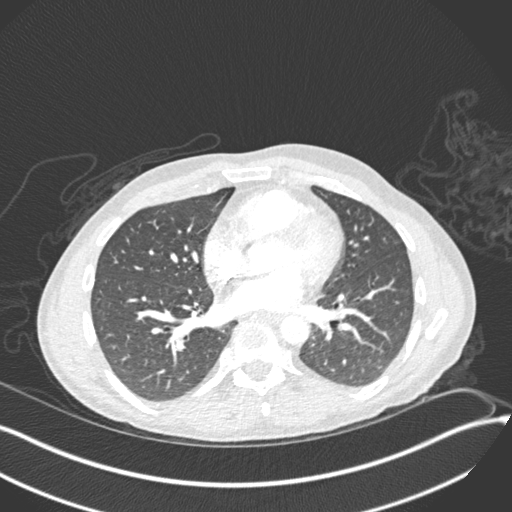
[im 169/323  soft-tissue]
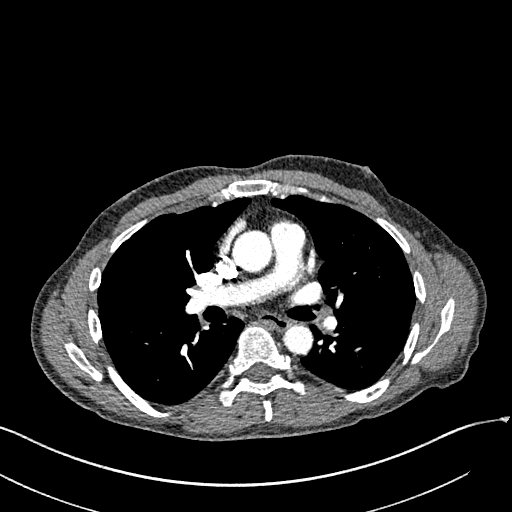
[im 183/323  lung]
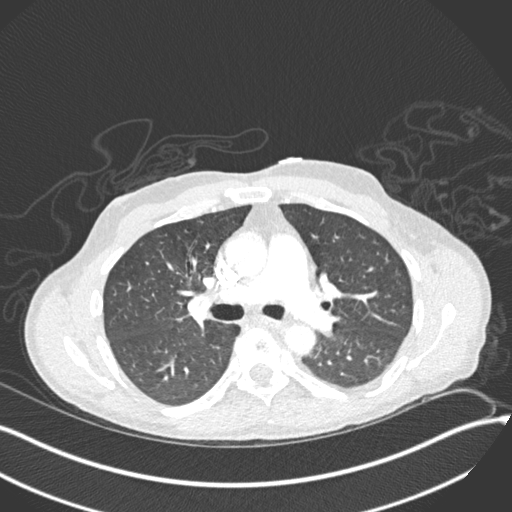
[im 197/323  soft-tissue]
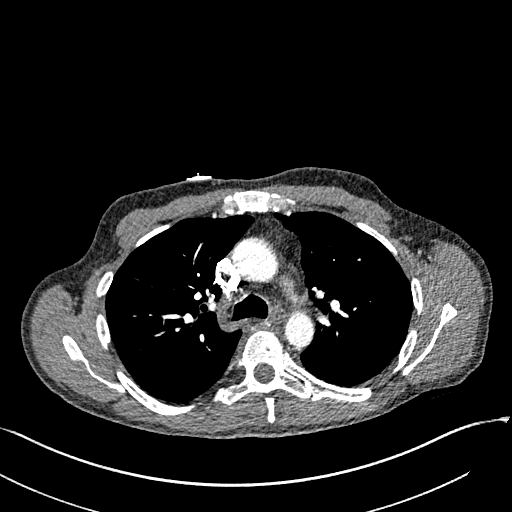
[im 225/323  lung]
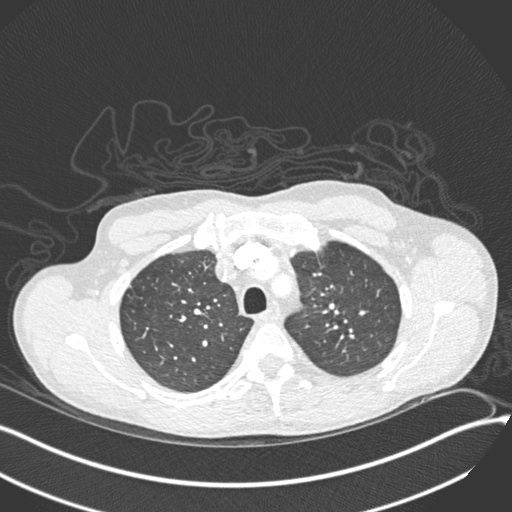
[im 239/323  soft-tissue]
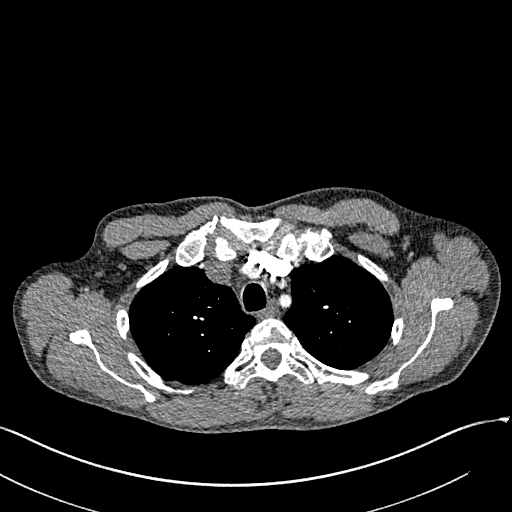
[im 267/323  lung]
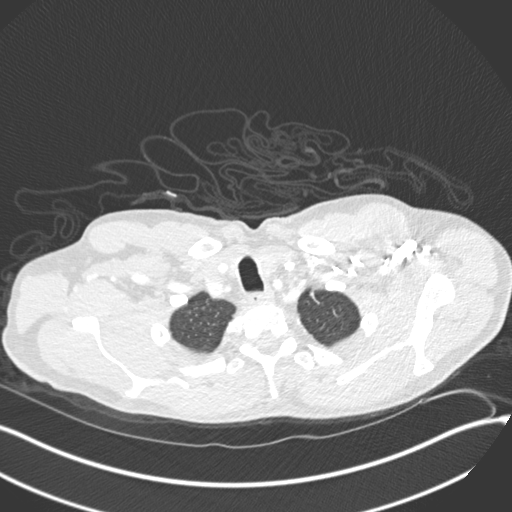
[im 281/323  soft-tissue]
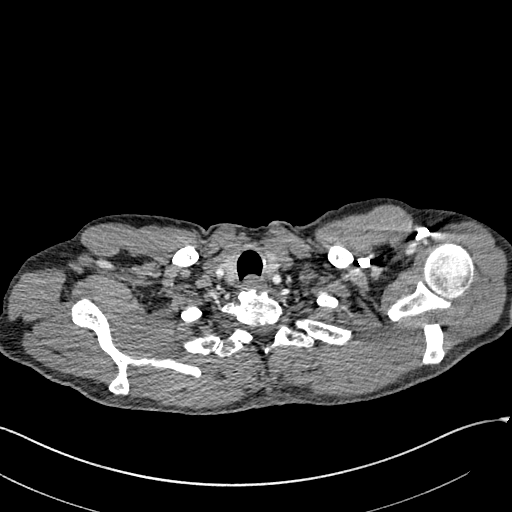
[im 309/323  lung]
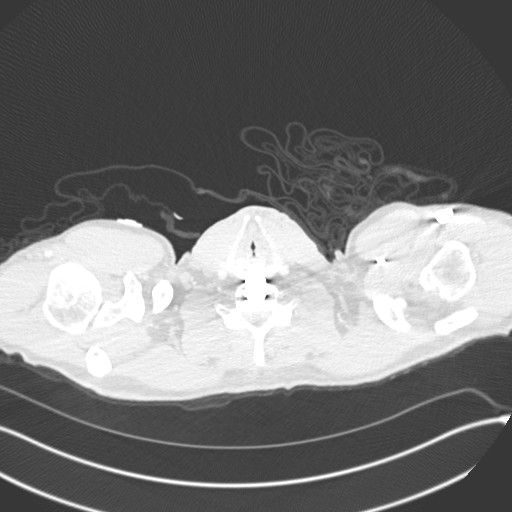

[Series 7: coronal mpr · coronal · 0.68mm/px · 3 of 126 slices shown]
[im 32/126  soft-tissue]
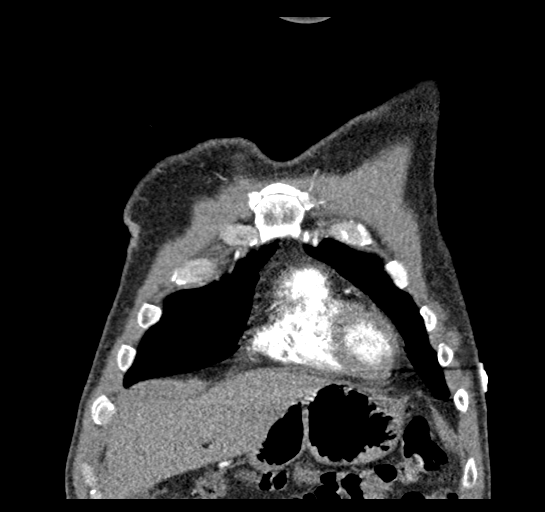
[im 63/126  soft-tissue]
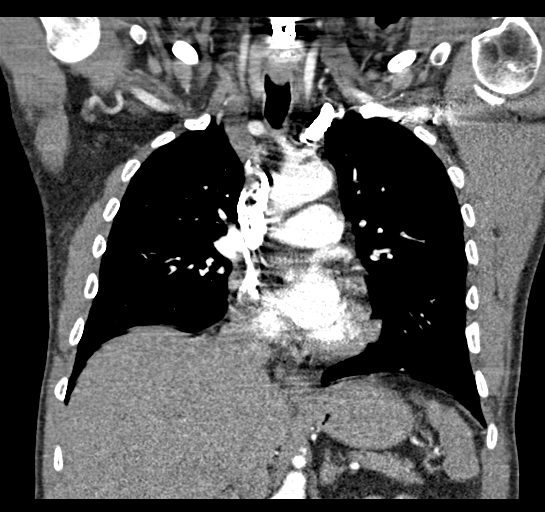
[im 94/126  soft-tissue]
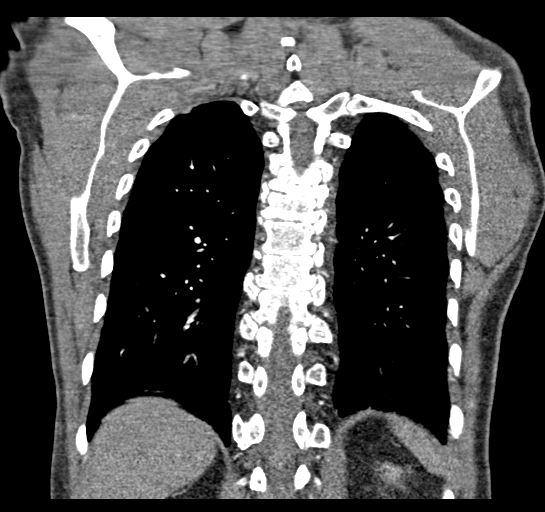

[18 of 46 positions shown; findings below may reference images not displayed]

FINDINGS: Cardiovascular: There is no evident pulmonary embolus. There is no
thoracic aortic aneurysm or dissection. A small focus of
calcification is noted in the proximal right innominate artery.
Visualized great vessels otherwise appear unremarkable. Note that
the right innominate and left common carotid arteries arise as a
common trunk, an anatomic variant. There is no appreciable
pericardial effusion or pericardial thickening.

Mediastinum/Nodes: Thyroid appears unremarkable. There is no evident
thoracic adenopathy. There is a rather minimal hiatal hernia.

Lungs/Pleura: There are scattered areas of mild scarring in the
extreme apices. There is atelectatic change with questionable
scarring in the anterior segment of the left upper lobe. There is no
edema or airspace opacity. No evident pleural effusions. On axial
slice 83 series 8, there is a 5 mm nodular opacity in the posterior
segment of the left lower lobe. On axial slice 46 series 8, there is
a 4 mm nodular opacity in the anterior segment of the right upper
lobe. On axial slice 34 series 8, there is a 2 mm nodular opacity
abutting the pleura in the anterior segment right upper lobe. There
are several 4-5 mm areas of what may represent peripheral right
upper lobe bronchiolectasis. No larger nodular type opacities
evident.

Upper Abdomen: There is a 5 mm probable cyst in the anterior segment
right lobe of the liver. Visualized upper abdominal structures
otherwise appear unremarkable.

Musculoskeletal: There is postoperative change in the lower cervical
region. There is mild anterior wedging of midthoracic vertebral
bodies, unchanged from recent chest radiograph. No evident blastic
or lytic bone lesions. No chest wall lesions.

Review of the MIP images confirms the above findings.
IMPRESSION: 1. No demonstrable pulmonary embolus. No thoracic aortic aneurysm or
dissection.

2. No edema or airspace opacity. Areas of mild scarring. Nodular
opacities ranging in size from 2-5 mm noted. No larger nodular
opacities evident. No follow-up needed if patient is low-risk (and
has no known or suspected primary neoplasm). Non-contrast chest CT
can be considered in 12 months if patient is high-risk. This
recommendation follows the consensus statement: Guidelines for
Management of Incidental Pulmonary Nodules Detected on CT Images:

3.  No evident adenopathy.

4.  Rather minimal hiatal hernia.

## 2022-10-23 NOTE — Telephone Encounter (Signed)
Unable to contact patient after several attempts.  Spoke with pharmacy, [facility]. They show patient has only ever filled Simvastatin with them, last fill 10/08/22. They do not show Atorvastatin on his history.   Please review, thanks!

## 2022-10-24 NOTE — Telephone Encounter (Signed)
noted 

## 2022-12-24 DEATH — deceased

## 2024-03-20 ENCOUNTER — Encounter (HOSPITAL_COMMUNITY): Payer: Self-pay | Admitting: Interventional Radiology
